# Patient Record
Sex: Female | Born: 1945 | Race: Black or African American | Hispanic: No | Smoking: Former smoker
Health system: Southern US, Community
[De-identification: ages and names within clinical notes are randomized; demographics above are authoritative.]

## PROBLEM LIST (undated history)

## (undated) DIAGNOSIS — I498 Other specified cardiac arrhythmias: Secondary | ICD-10-CM

## (undated) DIAGNOSIS — F32A Depression, unspecified: Secondary | ICD-10-CM

## (undated) DIAGNOSIS — I499 Cardiac arrhythmia, unspecified: Secondary | ICD-10-CM

## (undated) DIAGNOSIS — R51 Headache: Secondary | ICD-10-CM

## (undated) DIAGNOSIS — N183 Chronic kidney disease, stage 3 unspecified: Secondary | ICD-10-CM

## (undated) DIAGNOSIS — K449 Diaphragmatic hernia without obstruction or gangrene: Secondary | ICD-10-CM

## (undated) DIAGNOSIS — E119 Type 2 diabetes mellitus without complications: Secondary | ICD-10-CM

## (undated) DIAGNOSIS — I219 Acute myocardial infarction, unspecified: Secondary | ICD-10-CM

## (undated) DIAGNOSIS — M545 Low back pain, unspecified: Secondary | ICD-10-CM

## (undated) DIAGNOSIS — G971 Other reaction to spinal and lumbar puncture: Secondary | ICD-10-CM

## (undated) DIAGNOSIS — K219 Gastro-esophageal reflux disease without esophagitis: Secondary | ICD-10-CM

## (undated) DIAGNOSIS — M1990 Unspecified osteoarthritis, unspecified site: Secondary | ICD-10-CM

## (undated) DIAGNOSIS — I209 Angina pectoris, unspecified: Secondary | ICD-10-CM

## (undated) DIAGNOSIS — E669 Obesity, unspecified: Secondary | ICD-10-CM

## (undated) DIAGNOSIS — I509 Heart failure, unspecified: Secondary | ICD-10-CM

## (undated) DIAGNOSIS — I10 Essential (primary) hypertension: Secondary | ICD-10-CM

## (undated) DIAGNOSIS — Z9889 Other specified postprocedural states: Secondary | ICD-10-CM

## (undated) DIAGNOSIS — E039 Hypothyroidism, unspecified: Secondary | ICD-10-CM

## (undated) DIAGNOSIS — R112 Nausea with vomiting, unspecified: Secondary | ICD-10-CM

## (undated) DIAGNOSIS — D509 Iron deficiency anemia, unspecified: Secondary | ICD-10-CM

## (undated) DIAGNOSIS — I2510 Atherosclerotic heart disease of native coronary artery without angina pectoris: Secondary | ICD-10-CM

## (undated) DIAGNOSIS — R002 Palpitations: Secondary | ICD-10-CM

## (undated) DIAGNOSIS — I4891 Unspecified atrial fibrillation: Secondary | ICD-10-CM

## (undated) DIAGNOSIS — F329 Major depressive disorder, single episode, unspecified: Secondary | ICD-10-CM

## (undated) DIAGNOSIS — R519 Headache, unspecified: Secondary | ICD-10-CM

## (undated) DIAGNOSIS — J189 Pneumonia, unspecified organism: Secondary | ICD-10-CM

## (undated) DIAGNOSIS — J069 Acute upper respiratory infection, unspecified: Secondary | ICD-10-CM

## (undated) DIAGNOSIS — G8929 Other chronic pain: Secondary | ICD-10-CM

## (undated) DIAGNOSIS — R011 Cardiac murmur, unspecified: Secondary | ICD-10-CM

## (undated) DIAGNOSIS — IMO0001 Reserved for inherently not codable concepts without codable children: Secondary | ICD-10-CM

## (undated) DIAGNOSIS — Z5189 Encounter for other specified aftercare: Secondary | ICD-10-CM

## (undated) DIAGNOSIS — R0602 Shortness of breath: Secondary | ICD-10-CM

## (undated) DIAGNOSIS — T4145XA Adverse effect of unspecified anesthetic, initial encounter: Secondary | ICD-10-CM

## (undated) DIAGNOSIS — I472 Ventricular tachycardia: Secondary | ICD-10-CM

## (undated) HISTORY — DX: Essential (primary) hypertension: I10

## (undated) HISTORY — DX: Atherosclerotic heart disease of native coronary artery without angina pectoris: I25.10

## (undated) HISTORY — PX: DILATION AND CURETTAGE OF UTERUS: SHX78

## (undated) HISTORY — PX: TUBAL LIGATION: SHX77

## (undated) HISTORY — PX: LAPAROSCOPIC SALPINGOOPHERECTOMY: SUR795

## (undated) HISTORY — PX: ABDOMINAL HYSTERECTOMY: SHX81

## (undated) HISTORY — DX: Cardiac arrhythmia, unspecified: I49.9

## (undated) HISTORY — PX: REFRACTIVE SURGERY: SHX103

## (undated) HISTORY — DX: Obesity, unspecified: E66.9

## (undated) HISTORY — DX: Palpitations: R00.2

## (undated) HISTORY — DX: Other specified cardiac arrhythmias: I49.8

---

## 2008-04-10 HISTORY — PX: CORONARY ANGIOPLASTY WITH STENT PLACEMENT: SHX49

## 2009-05-11 DIAGNOSIS — I219 Acute myocardial infarction, unspecified: Secondary | ICD-10-CM

## 2009-05-11 DIAGNOSIS — T4145XA Adverse effect of unspecified anesthetic, initial encounter: Secondary | ICD-10-CM

## 2009-05-11 HISTORY — DX: Adverse effect of unspecified anesthetic, initial encounter: T41.45XA

## 2009-05-11 HISTORY — PX: CORONARY ANGIOPLASTY WITH STENT PLACEMENT: SHX49

## 2009-05-11 HISTORY — DX: Acute myocardial infarction, unspecified: I21.9

## 2010-02-05 ENCOUNTER — Emergency Department (HOSPITAL_COMMUNITY): Admission: EM | Admit: 2010-02-05 | Discharge: 2010-02-05 | Payer: Self-pay | Admitting: Emergency Medicine

## 2010-02-16 ENCOUNTER — Emergency Department (HOSPITAL_COMMUNITY): Admission: EM | Admit: 2010-02-16 | Discharge: 2010-02-16 | Payer: Self-pay | Admitting: Emergency Medicine

## 2010-02-19 ENCOUNTER — Emergency Department (HOSPITAL_COMMUNITY): Admission: EM | Admit: 2010-02-19 | Discharge: 2010-02-19 | Payer: Self-pay | Admitting: Emergency Medicine

## 2010-02-21 ENCOUNTER — Emergency Department (HOSPITAL_COMMUNITY): Admission: EM | Admit: 2010-02-21 | Discharge: 2010-02-21 | Payer: Self-pay | Admitting: Emergency Medicine

## 2010-03-03 ENCOUNTER — Ambulatory Visit: Payer: Self-pay | Admitting: Cardiothoracic Surgery

## 2010-03-09 ENCOUNTER — Emergency Department (HOSPITAL_COMMUNITY)
Admission: EM | Admit: 2010-03-09 | Discharge: 2010-03-09 | Payer: Self-pay | Source: Home / Self Care | Admitting: Emergency Medicine

## 2010-03-10 ENCOUNTER — Ambulatory Visit (HOSPITAL_COMMUNITY)
Admission: RE | Admit: 2010-03-10 | Discharge: 2010-03-10 | Payer: Self-pay | Source: Home / Self Care | Admitting: Cardiothoracic Surgery

## 2010-03-11 ENCOUNTER — Ambulatory Visit: Payer: Self-pay | Admitting: Cardiothoracic Surgery

## 2010-03-17 ENCOUNTER — Inpatient Hospital Stay (HOSPITAL_COMMUNITY): Admission: EM | Admit: 2010-03-17 | Discharge: 2010-03-03 | Payer: Self-pay | Admitting: Emergency Medicine

## 2010-04-10 HISTORY — PX: VIDEO ASSISTED THORACOSCOPY (VATS)/ LOBECTOMY: SHX6169

## 2010-06-21 LAB — URINALYSIS, ROUTINE W REFLEX MICROSCOPIC
Bilirubin Urine: NEGATIVE
Glucose, UA: NEGATIVE mg/dL
Hgb urine dipstick: NEGATIVE
Ketones, ur: NEGATIVE mg/dL
Ketones, ur: NEGATIVE mg/dL
Leukocytes, UA: NEGATIVE
Leukocytes, UA: NEGATIVE
Nitrite: NEGATIVE
Nitrite: NEGATIVE
Protein, ur: 100 mg/dL — AB
Protein, ur: >300 mg/dL — AB
Specific Gravity, Urine: 1.017 (ref 1.005–1.030)
Urobilinogen, UA: 0.2 mg/dL (ref 0.0–1.0)
Urobilinogen, UA: 0.2 mg/dL (ref 0.0–1.0)
pH: 5.5 (ref 5.0–8.0)

## 2010-06-21 LAB — BASIC METABOLIC PANEL
BUN: 23 mg/dL (ref 6–23)
BUN: 30 mg/dL — ABNORMAL HIGH (ref 6–23)
BUN: 30 mg/dL — ABNORMAL HIGH (ref 6–23)
BUN: 30 mg/dL — ABNORMAL HIGH (ref 6–23)
BUN: 31 mg/dL — ABNORMAL HIGH (ref 6–23)
CO2: 23 mEq/L (ref 19–32)
CO2: 24 mEq/L (ref 19–32)
CO2: 25 mEq/L (ref 19–32)
CO2: 26 mEq/L (ref 19–32)
CO2: 26 mEq/L (ref 19–32)
Calcium: 9.4 mg/dL (ref 8.4–10.5)
Calcium: 9.5 mg/dL (ref 8.4–10.5)
Calcium: 9.7 mg/dL (ref 8.4–10.5)
Calcium: 9.7 mg/dL (ref 8.4–10.5)
Chloride: 108 mEq/L (ref 96–112)
Chloride: 108 mEq/L (ref 96–112)
Chloride: 109 mEq/L (ref 96–112)
Chloride: 111 mEq/L (ref 96–112)
Chloride: 112 mEq/L (ref 96–112)
Creatinine, Ser: 1.65 mg/dL — ABNORMAL HIGH (ref 0.4–1.2)
Creatinine, Ser: 1.69 mg/dL — ABNORMAL HIGH (ref 0.4–1.2)
Creatinine, Ser: 1.79 mg/dL — ABNORMAL HIGH (ref 0.4–1.2)
Creatinine, Ser: 1.84 mg/dL — ABNORMAL HIGH (ref 0.4–1.2)
Creatinine, Ser: 1.91 mg/dL — ABNORMAL HIGH (ref 0.4–1.2)
Creatinine, Ser: 2.03 mg/dL — ABNORMAL HIGH (ref 0.4–1.2)
GFR calc Af Amer: 32 mL/min — ABNORMAL LOW (ref >60)
GFR calc Af Amer: 34 mL/min — ABNORMAL LOW (ref >60)
GFR calc Af Amer: 37 mL/min — ABNORMAL LOW (ref >60)
GFR calc Af Amer: 38 mL/min — ABNORMAL LOW (ref >60)
GFR calc non Af Amer: 26 mL/min — ABNORMAL LOW (ref >60)
GFR calc non Af Amer: 29 mL/min — ABNORMAL LOW (ref >60)
GFR calc non Af Amer: 30 mL/min — ABNORMAL LOW (ref >60)
GFR calc non Af Amer: 31 mL/min — ABNORMAL LOW (ref >60)
Glucose, Bld: 109 mg/dL — ABNORMAL HIGH (ref 70–99)
Glucose, Bld: 116 mg/dL — ABNORMAL HIGH (ref 70–99)
Glucose, Bld: 120 mg/dL — ABNORMAL HIGH (ref 70–99)
Glucose, Bld: 178 mg/dL — ABNORMAL HIGH (ref 70–99)
Potassium: 3.5 mEq/L (ref 3.5–5.1)
Potassium: 3.5 mEq/L (ref 3.5–5.1)
Potassium: 3.6 mEq/L (ref 3.5–5.1)
Potassium: 3.7 mEq/L (ref 3.5–5.1)
Potassium: 4.2 mEq/L (ref 3.5–5.1)
Sodium: 142 mEq/L (ref 135–145)
Sodium: 145 mEq/L (ref 135–145)
Sodium: 145 mEq/L (ref 135–145)

## 2010-06-21 LAB — COMPREHENSIVE METABOLIC PANEL
ALT: 11 U/L (ref 0–35)
AST: 17 U/L (ref 0–37)
Albumin: 3.5 g/dL (ref 3.5–5.2)
CO2: 26 mEq/L (ref 19–32)
Calcium: 9.3 mg/dL (ref 8.4–10.5)
Creatinine, Ser: 1.81 mg/dL — ABNORMAL HIGH (ref 0.4–1.2)
GFR calc Af Amer: 34 mL/min — ABNORMAL LOW (ref >60)
GFR calc non Af Amer: 28 mL/min — ABNORMAL LOW (ref >60)
Sodium: 145 mEq/L (ref 135–145)

## 2010-06-21 LAB — CBC
HCT: 28.4 % — ABNORMAL LOW (ref 36.0–46.0)
HCT: 29.7 % — ABNORMAL LOW (ref 36.0–46.0)
HCT: 34.2 % — ABNORMAL LOW (ref 36.0–46.0)
HCT: 34.2 % — ABNORMAL LOW (ref 36.0–46.0)
Hemoglobin: 11.3 g/dL — ABNORMAL LOW (ref 12.0–15.0)
Hemoglobin: 11.5 g/dL — ABNORMAL LOW (ref 12.0–15.0)
Hemoglobin: 11.8 g/dL — ABNORMAL LOW (ref 12.0–15.0)
Hemoglobin: 11.8 g/dL — ABNORMAL LOW (ref 12.0–15.0)
MCH: 30.2 pg (ref 26.0–34.0)
MCH: 30.4 pg (ref 26.0–34.0)
MCH: 30.4 pg (ref 26.0–34.0)
MCH: 30.4 pg (ref 26.0–34.0)
MCHC: 34.2 g/dL (ref 30.0–36.0)
MCHC: 34.4 g/dL (ref 30.0–36.0)
MCHC: 34.6 g/dL (ref 30.0–36.0)
MCHC: 34.6 g/dL (ref 30.0–36.0)
MCHC: 34.7 g/dL (ref 30.0–36.0)
MCV: 87.8 fL (ref 78.0–100.0)
MCV: 88.2 fL (ref 78.0–100.0)
MCV: 88.4 fL (ref 78.0–100.0)
MCV: 88.6 fL (ref 78.0–100.0)
Platelets: 184 10*3/uL (ref 150–400)
Platelets: 187 10*3/uL (ref 150–400)
Platelets: 204 10*3/uL (ref 150–400)
Platelets: 222 10*3/uL (ref 150–400)
Platelets: 276 10*3/uL (ref 150–400)
Platelets: 277 10*3/uL (ref 150–400)
RBC: 3.68 MIL/uL — ABNORMAL LOW (ref 3.87–5.11)
RBC: 3.73 MIL/uL — ABNORMAL LOW (ref 3.87–5.11)
RBC: 3.81 MIL/uL — ABNORMAL LOW (ref 3.87–5.11)
RBC: 3.87 MIL/uL (ref 3.87–5.11)
RBC: 3.89 MIL/uL (ref 3.87–5.11)
RDW: 14.9 % (ref 11.5–15.5)
RDW: 15.4 % (ref 11.5–15.5)
RDW: 15.4 % (ref 11.5–15.5)
RDW: 15.5 % (ref 11.5–15.5)
WBC: 5.9 10*3/uL (ref 4.0–10.5)
WBC: 6 10*3/uL (ref 4.0–10.5)
WBC: 7.7 10*3/uL (ref 4.0–10.5)
WBC: 8.2 10*3/uL (ref 4.0–10.5)

## 2010-06-21 LAB — DIFFERENTIAL
Basophils Absolute: 0 10*3/uL (ref 0.0–0.1)
Basophils Absolute: 0.1 10*3/uL (ref 0.0–0.1)
Basophils Relative: 0 % (ref 0–1)
Basophils Relative: 0 % (ref 0–1)
Basophils Relative: 1 % (ref 0–1)
Eosinophils Absolute: 0.2 10*3/uL (ref 0.0–0.7)
Eosinophils Absolute: 0.2 10*3/uL (ref 0.0–0.7)
Eosinophils Absolute: 0.2 10*3/uL (ref 0.0–0.7)
Eosinophils Relative: 2 % (ref 0–5)
Eosinophils Relative: 3 % (ref 0–5)
Lymphocytes Relative: 30 % (ref 12–46)
Lymphocytes Relative: 34 % (ref 12–46)
Lymphs Abs: 1.9 10*3/uL (ref 0.7–4.0)
Lymphs Abs: 2 10*3/uL (ref 0.7–4.0)
Lymphs Abs: 2.5 10*3/uL (ref 0.7–4.0)
Lymphs Abs: 2.6 10*3/uL (ref 0.7–4.0)
Monocytes Absolute: 0.1 10*3/uL (ref 0.1–1.0)
Monocytes Absolute: 0.3 10*3/uL (ref 0.1–1.0)
Monocytes Relative: 1 % — ABNORMAL LOW (ref 3–12)
Monocytes Relative: 4 % (ref 3–12)
Monocytes Relative: 5 % (ref 3–12)
Neutro Abs: 3.7 10*3/uL (ref 1.7–7.7)
Neutro Abs: 4.5 10*3/uL (ref 1.7–7.7)
Neutro Abs: 5.1 10*3/uL (ref 1.7–7.7)
Neutrophils Relative %: 59 % (ref 43–77)
Neutrophils Relative %: 62 % (ref 43–77)
Neutrophils Relative %: 63 % (ref 43–77)
Neutrophils Relative %: 64 % (ref 43–77)

## 2010-06-21 LAB — GLUCOSE, CAPILLARY
Glucose-Capillary: 145 mg/dL — ABNORMAL HIGH (ref 70–99)
Glucose-Capillary: 114 mg/dL — ABNORMAL HIGH (ref 70–99)
Glucose-Capillary: 117 mg/dL — ABNORMAL HIGH (ref 70–99)
Glucose-Capillary: 120 mg/dL — ABNORMAL HIGH (ref 70–99)
Glucose-Capillary: 128 mg/dL — ABNORMAL HIGH (ref 70–99)
Glucose-Capillary: 131 mg/dL — ABNORMAL HIGH (ref 70–99)
Glucose-Capillary: 135 mg/dL — ABNORMAL HIGH (ref 70–99)
Glucose-Capillary: 177 mg/dL — ABNORMAL HIGH (ref 70–99)
Glucose-Capillary: 182 mg/dL — ABNORMAL HIGH (ref 70–99)
Glucose-Capillary: 186 mg/dL — ABNORMAL HIGH (ref 70–99)
Glucose-Capillary: 204 mg/dL — ABNORMAL HIGH (ref 70–99)
Glucose-Capillary: 208 mg/dL — ABNORMAL HIGH (ref 70–99)
Glucose-Capillary: 218 mg/dL — ABNORMAL HIGH (ref 70–99)
Glucose-Capillary: 239 mg/dL — ABNORMAL HIGH (ref 70–99)

## 2010-06-21 LAB — POCT CARDIAC MARKERS
CKMB, poc: 1.1 ng/mL (ref 1.0–8.0)
CKMB, poc: 1.1 ng/mL (ref 1.0–8.0)
CKMB, poc: 1.8 ng/mL (ref 1.0–8.0)
CKMB, poc: 3.2 ng/mL (ref 1.0–8.0)
Myoglobin, poc: 156 ng/mL (ref 12–200)
Myoglobin, poc: 165 ng/mL (ref 12–200)
Myoglobin, poc: 194 ng/mL (ref 12–200)
Myoglobin, poc: 275 ng/mL (ref 12–200)
Myoglobin, poc: 316 ng/mL (ref 12–200)
Troponin i, poc: <0.05 ng/mL (ref 0.00–0.09)
Troponin i, poc: <0.05 ng/mL (ref 0.00–0.09)
Troponin i, poc: <0.05 ng/mL (ref 0.00–0.09)

## 2010-06-21 LAB — URINE MICROSCOPIC-ADD ON

## 2010-06-21 LAB — URINE CULTURE
Colony Count: NO GROWTH
Culture  Setup Time: 201111302119
Culture: NO GROWTH

## 2010-06-21 LAB — HEMOGLOBIN A1C
Hgb A1c MFr Bld: 6.6 % — ABNORMAL HIGH (ref <5.7)
Mean Plasma Glucose: 143 mg/dL — ABNORMAL HIGH (ref <117)

## 2010-06-21 LAB — LIPID PANEL
HDL: 38 mg/dL — ABNORMAL LOW (ref >39)
Total CHOL/HDL Ratio: 3.8 RATIO
Triglycerides: 201 mg/dL — ABNORMAL HIGH (ref <150)
VLDL: 40 mg/dL (ref 0–40)

## 2010-06-21 LAB — CARDIAC PANEL(CRET KIN+CKTOT+MB+TROPI)
CK, MB: 1.9 ng/mL (ref 0.3–4.0)
CK, MB: 2.3 ng/mL (ref 0.3–4.0)
Relative Index: 1.1 (ref 0.0–2.5)
Relative Index: 1.2 (ref 0.0–2.5)
Total CK: 165 U/L (ref 7–177)
Total CK: 167 U/L (ref 7–177)
Total CK: 193 U/L — ABNORMAL HIGH (ref 7–177)
Troponin I: 0.02 ng/mL (ref 0.00–0.06)

## 2010-06-21 LAB — TSH: TSH: 1.81 u[IU]/mL (ref 0.350–4.500)

## 2010-08-23 NOTE — Assessment & Plan Note (Signed)
OFFICE VISIT   ORTHA, METTS  DOB:  1945/04/24                                        March 11, 2010  CHART #:  [id number]   PRIMARY CARE PHYSICIAN:  Massie Maroon, MD   PRIMARY CARDIOLOGIST:  Thurmon Fair, MD [facility].   PROBLEMS:  1. 1.5-cm left upper lobe nodule with mild hypermetabolic activity on      PET scan, consistent with bronchogenic carcinoma.  2. History of coronary artery disease, status post left anterior      descending coronary artery stent in [place], [place] in January      2011 with EF of 30%.  3. Hypertension.  4. Diabetes mellitus.  5. Chronic renal insufficiency with creatinine of 1.8.  6. History of ventricular bigeminy.  7. Dyslipidemia.  8. Obesity.   CURRENT MEDICATIONS:  1. Coreg 3.125 mg b.i.d.  2. Humalog insulin with meals per sliding scale.  3. Lantus insulin 20 units daily.  4. Plavix 75 mg daily (bare metal stent placed in January 2011).  5. Aspirin 81 mg daily.  6. Imdur 60 mg daily.  7. Omeprazole 20 mg p.o. q.a.m.  8. Iron 1 tablet daily.  9. Amlodipine 10 mg q.a.m.  10.Potassium 20 mEq q.a.m.  11.Temazepam 50 mg at bedtime p.r.n.  12.Lasix 80 mg p.o. daily.  13.Simvastatin 40 mg at bedtime.  14.Hydralazine 50 mg q.a.m., 25 mg at noon and 25 mg at bedtime.  15.Avapro 25 mg q.a.m.  16.Zemplar 1 mcg p.o. daily.  17.Lumigan eyedrops at bedtime.  18.Alphagan eyedrops b.i.d.   HISTORY OF PRESENT ILLNESS:  Ms. Wanda [name] is a 65 year old African  American female, ex-smoker who was recently hospitalized at [facility]  for a variety of reasons including ventricular ectopy, a fall with a  right ankle injury, severe hypertension and chest pain, and treatment of  a right lower lobe pneumonia.  During this hospitalization, a CT scan  was performed to rule out PE, but demonstrated a spiculated 1.5-cm  nodule on the left upper lobe.  She was treated for her other problems  with  antibiotics and Lasix, and a Cardiology consultation was completed  by Dr. Royann Shivers, who recommended obtaining the patient's records from  her percutaneous intervention earlier this year at [facility], [place]  and to follow her up in the office.  Since her return home, she has had  no recurrent symptoms of chest pain or of upper respiratory infection.  We have received the records from Dr. Doylene Bode in [place], who  has been the patient's cardiologist for several years.  She had  percutaneous intervention for an acute MI - unstable angina in January  2011 with a bare-metal stent to the LAD.  She had recurrent chest pain  and the arteriogram in February 2011 showed a widely patent stent  without other significant disease.  Of note, her EF was noted to be 30%.  She has no known valvular disease, and she has been taking Plavix daily.  The diagonal branch of the LAD was partially obstructed by the LAD  stent.  This has been well tolerated.   SOCIAL HISTORY:  The patient lives with her daughter in [place], also is  staying with her sister now in [place].  She does not smoke or drink  alcohol.   FAMILY HISTORY:  Positive for cancer in her son, who died of a CNS  lymphoma and sister, who had breast cancer.   ALLERGIES:  ERYTHROMYCIN.   PHYSICAL EXAMINATION:  Vital Signs:  The patient is 5 feet and 1, weighs  210 pounds.  Blood pressure 170/60, pulse 50, respirations 20,  saturation 97% on room air.  General:  She is alert and anxious, but  pleasant.  Lungs:  Breath sounds are clear and equal.  Cardiac:  Rhythm is regular,  but slow without murmur.  Neck:  Without adenopathy.  Extremities:  Without edema or tenderness.  Neurologic:  Intact.  She is able to walk  slowly, although she favors her right leg due to the ankle sprain.   LABORATORY DATA:  Since the patient was in the hospital, she has  received a PET scan and CT scan of the brain.  CT scan shows a possible  internal  capsule small infarct, but no evidence of metastatic disease.  The PET scan shows a 1.5-cm left upper lobe nodule in the central region  of the upper lobe with a SUB of 2.4 showing mild hypermetabolic  activity.  There is no abnormal metabolic activity of the mediastinal  nodes, the contralateral lung, or intraabdominal organs.   Office spirometry performed today shows FEV-1 of 2.1 and FVC of 3.1,  both almost normal.   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  She has associated significant coronary artery  disease and left ventricular dysfunction.  She has a bare-metal stent in  the left anterior descending coronary artery, which was placed over 9  months ago.  She has not had cardiac assessment in [place], but her  records from earlier this year are obtained from her  cardiologist, Dr. Doylene Bode in [place], [place].   I have recommended the patient to proceed with left VATS and probable  left upper lobectomy.  I would not recommend needle biopsy or  electromagnetic navigational bronchoscopy for biopsy due to the small  size of the lesion.  Even if the biopsy is negative, I would still  recommend the resection due to his highly suspicious nature on CT scan.  She will consider her options having the surgery here versus going back  to [place] as she has a daughter to provide better support for her after  the hospitalization.  She will let us know within the next 72 hours and  we can tentatively schedule her surgery for the next 7-10 days.  Prior  to surgery, she would need a chest wall 2-D echo, and we will have Dr.  Royann Shivers and his colleagues follow the patient after her thoracotomy.   Kerin Perna, M.D.  Electronically Signed   PV/MEDQ  D:  03/11/2010  T:  03/12/2010  Job:  [id number]

## 2010-09-05 ENCOUNTER — Emergency Department (HOSPITAL_COMMUNITY): Payer: Medicaid - Out of State

## 2010-09-05 ENCOUNTER — Emergency Department (HOSPITAL_COMMUNITY)
Admission: EM | Admit: 2010-09-05 | Discharge: 2010-09-06 | Disposition: A | Discharge status: Home / Self Care | Payer: Medicaid - Out of State | Attending: Emergency Medicine | Admitting: Emergency Medicine

## 2010-09-05 DIAGNOSIS — M7989 Other specified soft tissue disorders: Secondary | ICD-10-CM | POA: Insufficient documentation

## 2010-09-05 DIAGNOSIS — R5381 Other malaise: Secondary | ICD-10-CM | POA: Insufficient documentation

## 2010-09-05 DIAGNOSIS — R5383 Other fatigue: Secondary | ICD-10-CM | POA: Insufficient documentation

## 2010-09-05 DIAGNOSIS — R609 Edema, unspecified: Secondary | ICD-10-CM | POA: Insufficient documentation

## 2010-09-05 DIAGNOSIS — N289 Disorder of kidney and ureter, unspecified: Secondary | ICD-10-CM | POA: Insufficient documentation

## 2010-09-05 DIAGNOSIS — I10 Essential (primary) hypertension: Secondary | ICD-10-CM | POA: Insufficient documentation

## 2010-09-05 DIAGNOSIS — I509 Heart failure, unspecified: Secondary | ICD-10-CM | POA: Insufficient documentation

## 2010-09-05 DIAGNOSIS — Z85118 Personal history of other malignant neoplasm of bronchus and lung: Secondary | ICD-10-CM | POA: Insufficient documentation

## 2010-09-05 DIAGNOSIS — I498 Other specified cardiac arrhythmias: Secondary | ICD-10-CM | POA: Insufficient documentation

## 2010-09-05 DIAGNOSIS — E119 Type 2 diabetes mellitus without complications: Secondary | ICD-10-CM | POA: Insufficient documentation

## 2010-09-05 LAB — CBC
Platelets: 235 10*3/uL (ref 150–400)
RBC: 3.88 MIL/uL (ref 3.87–5.11)
WBC: 7 10*3/uL (ref 4.0–10.5)

## 2010-09-05 LAB — DIFFERENTIAL
Basophils Relative: 0 % (ref 0–1)
Eosinophils Absolute: 0.1 10*3/uL (ref 0.0–0.7)
Neutrophils Relative %: 51 % (ref 43–77)

## 2010-09-05 LAB — GLUCOSE, CAPILLARY: Glucose-Capillary: 112 mg/dL — ABNORMAL HIGH (ref 70–99)

## 2010-09-06 LAB — URINE MICROSCOPIC-ADD ON

## 2010-09-06 LAB — BASIC METABOLIC PANEL
BUN: 44 mg/dL — ABNORMAL HIGH (ref 6–23)
Calcium: 9.4 mg/dL (ref 8.4–10.5)
GFR calc non Af Amer: 21 mL/min — ABNORMAL LOW (ref >60)
Glucose, Bld: 103 mg/dL — ABNORMAL HIGH (ref 70–99)
Potassium: 4.2 mEq/L (ref 3.5–5.1)

## 2010-09-06 LAB — CK TOTAL AND CKMB (NOT AT ARMC)
CK, MB: 2.9 ng/mL (ref 0.3–4.0)
Relative Index: 1.7 (ref 0.0–2.5)
Relative Index: 1.8 (ref 0.0–2.5)

## 2010-09-06 LAB — URINALYSIS, ROUTINE W REFLEX MICROSCOPIC
Bilirubin Urine: NEGATIVE
Hgb urine dipstick: NEGATIVE
Ketones, ur: NEGATIVE mg/dL
Nitrite: NEGATIVE
pH: 5.5 (ref 5.0–8.0)

## 2010-09-06 LAB — PROTIME-INR
INR: 1.06 (ref 0.00–1.49)
Prothrombin Time: 14 seconds (ref 11.6–15.2)

## 2010-09-06 LAB — TROPONIN I: Troponin I: <0.3 ng/mL (ref <0.30)

## 2010-09-07 ENCOUNTER — Telehealth: Payer: Self-pay | Admitting: Cardiology

## 2010-09-07 NOTE — Telephone Encounter (Signed)
ROI Mailed to PT  09/07/10/km

## 2010-09-08 ENCOUNTER — Emergency Department (HOSPITAL_COMMUNITY): Payer: Medicaid - Out of State

## 2010-09-08 ENCOUNTER — Emergency Department (HOSPITAL_COMMUNITY)
Admission: EM | Admit: 2010-09-08 | Discharge: 2010-09-08 | Disposition: A | Discharge status: Home / Self Care | Payer: Medicaid - Out of State | Attending: Emergency Medicine | Admitting: Emergency Medicine

## 2010-09-08 ENCOUNTER — Encounter: Payer: Self-pay | Admitting: Cardiology

## 2010-09-08 ENCOUNTER — Encounter: Payer: Self-pay | Admitting: *Deleted

## 2010-09-08 DIAGNOSIS — E119 Type 2 diabetes mellitus without complications: Secondary | ICD-10-CM | POA: Insufficient documentation

## 2010-09-08 DIAGNOSIS — Z794 Long term (current) use of insulin: Secondary | ICD-10-CM | POA: Insufficient documentation

## 2010-09-08 DIAGNOSIS — N289 Disorder of kidney and ureter, unspecified: Secondary | ICD-10-CM | POA: Insufficient documentation

## 2010-09-08 DIAGNOSIS — I2510 Atherosclerotic heart disease of native coronary artery without angina pectoris: Secondary | ICD-10-CM | POA: Insufficient documentation

## 2010-09-08 DIAGNOSIS — R0602 Shortness of breath: Secondary | ICD-10-CM | POA: Insufficient documentation

## 2010-09-08 DIAGNOSIS — I10 Essential (primary) hypertension: Secondary | ICD-10-CM | POA: Insufficient documentation

## 2010-09-08 LAB — POCT I-STAT, CHEM 8
BUN: 40 mg/dL — ABNORMAL HIGH (ref 6–23)
Chloride: 112 mEq/L (ref 96–112)
Creatinine, Ser: 2.1 mg/dL — ABNORMAL HIGH (ref 0.4–1.2)
Glucose, Bld: 72 mg/dL (ref 70–99)
Potassium: 3.6 mEq/L (ref 3.5–5.1)

## 2010-09-09 ENCOUNTER — Observation Stay (HOSPITAL_COMMUNITY)
Admission: EM | Admit: 2010-09-09 | Discharge: 2010-09-14 | Disposition: A | Discharge status: Home / Self Care | Payer: Medicare Other | Attending: Internal Medicine | Admitting: Internal Medicine

## 2010-09-09 ENCOUNTER — Emergency Department (HOSPITAL_COMMUNITY): Payer: Medicare Other

## 2010-09-09 ENCOUNTER — Ambulatory Visit: Payer: Medicaid - Out of State | Admitting: Cardiology

## 2010-09-09 DIAGNOSIS — I252 Old myocardial infarction: Secondary | ICD-10-CM | POA: Insufficient documentation

## 2010-09-09 DIAGNOSIS — J449 Chronic obstructive pulmonary disease, unspecified: Secondary | ICD-10-CM | POA: Insufficient documentation

## 2010-09-09 DIAGNOSIS — Z79899 Other long term (current) drug therapy: Secondary | ICD-10-CM | POA: Insufficient documentation

## 2010-09-09 DIAGNOSIS — J4489 Other specified chronic obstructive pulmonary disease: Secondary | ICD-10-CM | POA: Insufficient documentation

## 2010-09-09 DIAGNOSIS — J984 Other disorders of lung: Secondary | ICD-10-CM | POA: Insufficient documentation

## 2010-09-09 DIAGNOSIS — I4891 Unspecified atrial fibrillation: Secondary | ICD-10-CM | POA: Insufficient documentation

## 2010-09-09 DIAGNOSIS — Z794 Long term (current) use of insulin: Secondary | ICD-10-CM | POA: Insufficient documentation

## 2010-09-09 DIAGNOSIS — H409 Unspecified glaucoma: Secondary | ICD-10-CM | POA: Insufficient documentation

## 2010-09-09 DIAGNOSIS — I498 Other specified cardiac arrhythmias: Secondary | ICD-10-CM | POA: Insufficient documentation

## 2010-09-09 DIAGNOSIS — I129 Hypertensive chronic kidney disease with stage 1 through stage 4 chronic kidney disease, or unspecified chronic kidney disease: Secondary | ICD-10-CM | POA: Insufficient documentation

## 2010-09-09 DIAGNOSIS — G4733 Obstructive sleep apnea (adult) (pediatric): Secondary | ICD-10-CM | POA: Insufficient documentation

## 2010-09-09 DIAGNOSIS — I4949 Other premature depolarization: Secondary | ICD-10-CM | POA: Insufficient documentation

## 2010-09-09 DIAGNOSIS — E669 Obesity, unspecified: Secondary | ICD-10-CM | POA: Insufficient documentation

## 2010-09-09 DIAGNOSIS — N183 Chronic kidney disease, stage 3 unspecified: Secondary | ICD-10-CM | POA: Insufficient documentation

## 2010-09-09 DIAGNOSIS — I2510 Atherosclerotic heart disease of native coronary artery without angina pectoris: Secondary | ICD-10-CM | POA: Insufficient documentation

## 2010-09-09 DIAGNOSIS — I2789 Other specified pulmonary heart diseases: Secondary | ICD-10-CM | POA: Insufficient documentation

## 2010-09-09 DIAGNOSIS — E119 Type 2 diabetes mellitus without complications: Secondary | ICD-10-CM | POA: Insufficient documentation

## 2010-09-09 DIAGNOSIS — E785 Hyperlipidemia, unspecified: Secondary | ICD-10-CM | POA: Insufficient documentation

## 2010-09-09 DIAGNOSIS — I509 Heart failure, unspecified: Secondary | ICD-10-CM | POA: Insufficient documentation

## 2010-09-09 DIAGNOSIS — R0789 Other chest pain: Principal | ICD-10-CM | POA: Insufficient documentation

## 2010-09-09 DIAGNOSIS — D509 Iron deficiency anemia, unspecified: Secondary | ICD-10-CM | POA: Insufficient documentation

## 2010-09-09 DIAGNOSIS — I5030 Unspecified diastolic (congestive) heart failure: Secondary | ICD-10-CM | POA: Insufficient documentation

## 2010-09-10 LAB — CARDIAC PANEL(CRET KIN+CKTOT+MB+TROPI)
CK, MB: 2.5 ng/mL (ref 0.3–4.0)
CK, MB: 2.4 ng/mL (ref 0.3–4.0)
Relative Index: 1.6 (ref 0.0–2.5)
Total CK: 153 U/L (ref 7–177)
Troponin I: <0.3 ng/mL (ref <0.30)

## 2010-09-10 LAB — GLUCOSE, CAPILLARY
Glucose-Capillary: 170 mg/dL — ABNORMAL HIGH (ref 70–99)
Glucose-Capillary: 140 mg/dL — ABNORMAL HIGH (ref 70–99)

## 2010-09-10 LAB — COMPREHENSIVE METABOLIC PANEL
AST: 13 U/L (ref 0–37)
Alkaline Phosphatase: 132 U/L — ABNORMAL HIGH (ref 39–117)
BUN: 36 mg/dL — ABNORMAL HIGH (ref 6–23)
CO2: 23 mEq/L (ref 19–32)
Chloride: 106 mEq/L (ref 96–112)
Creatinine, Ser: 2.08 mg/dL — ABNORMAL HIGH (ref 0.4–1.2)
GFR calc Af Amer: 29 mL/min — ABNORMAL LOW (ref >60)
GFR calc non Af Amer: 24 mL/min — ABNORMAL LOW (ref >60)
Potassium: 3.4 mEq/L — ABNORMAL LOW (ref 3.5–5.1)
Total Bilirubin: 0.2 mg/dL — ABNORMAL LOW (ref 0.3–1.2)

## 2010-09-10 LAB — CBC
HCT: 31.2 % — ABNORMAL LOW (ref 36.0–46.0)
MCHC: 34 g/dL (ref 30.0–36.0)
MCV: 88.9 fL (ref 78.0–100.0)
RDW: 14.7 % (ref 11.5–15.5)

## 2010-09-10 LAB — CK TOTAL AND CKMB (NOT AT ARMC): Relative Index: 1.4 (ref 0.0–2.5)

## 2010-09-10 LAB — DIFFERENTIAL
Eosinophils Relative: 2 % (ref 0–5)
Lymphocytes Relative: 39 % (ref 12–46)
Lymphs Abs: 3.2 10*3/uL (ref 0.7–4.0)
Monocytes Absolute: 0.7 10*3/uL (ref 0.1–1.0)

## 2010-09-10 LAB — LIPID PANEL
Cholesterol: 155 mg/dL (ref 0–200)
LDL Cholesterol: 88 mg/dL (ref 0–99)
Triglycerides: 102 mg/dL (ref <150)
VLDL: 20 mg/dL (ref 0–40)

## 2010-09-10 LAB — TSH: TSH: 5.392 u[IU]/mL — ABNORMAL HIGH (ref 0.350–4.500)

## 2010-09-11 ENCOUNTER — Inpatient Hospital Stay (HOSPITAL_COMMUNITY): Payer: Medicare Other

## 2010-09-11 LAB — BASIC METABOLIC PANEL
BUN: 39 mg/dL — ABNORMAL HIGH (ref 6–23)
Calcium: 9.1 mg/dL (ref 8.4–10.5)
GFR calc non Af Amer: 25 mL/min — ABNORMAL LOW (ref >60)
Potassium: 3.5 mEq/L (ref 3.5–5.1)
Sodium: 145 mEq/L (ref 135–145)

## 2010-09-11 LAB — GLUCOSE, CAPILLARY
Glucose-Capillary: 90 mg/dL (ref 70–99)
Glucose-Capillary: 142 mg/dL — ABNORMAL HIGH (ref 70–99)
Glucose-Capillary: 84 mg/dL (ref 70–99)

## 2010-09-11 LAB — MAGNESIUM: Magnesium: 2.2 mg/dL (ref 1.5–2.5)

## 2010-09-11 LAB — CBC
MCHC: 32.7 g/dL (ref 30.0–36.0)
Platelets: 215 10*3/uL (ref 150–400)
RDW: 14.6 % (ref 11.5–15.5)
WBC: 6.9 10*3/uL (ref 4.0–10.5)

## 2010-09-12 ENCOUNTER — Inpatient Hospital Stay (HOSPITAL_COMMUNITY): Payer: Medicare Other

## 2010-09-12 LAB — CBC
MCHC: 33.1 g/dL (ref 30.0–36.0)
MCV: 90.7 fL (ref 78.0–100.0)
Platelets: 188 10*3/uL (ref 150–400)
RDW: 14.6 % (ref 11.5–15.5)
WBC: 5.8 10*3/uL (ref 4.0–10.5)

## 2010-09-12 LAB — BASIC METABOLIC PANEL
BUN: 39 mg/dL — ABNORMAL HIGH (ref 6–23)
Calcium: 9.4 mg/dL (ref 8.4–10.5)
Creatinine, Ser: 1.98 mg/dL — ABNORMAL HIGH (ref 0.4–1.2)
GFR calc non Af Amer: 25 mL/min — ABNORMAL LOW (ref >60)
Glucose, Bld: 74 mg/dL (ref 70–99)
Potassium: 4 mEq/L (ref 3.5–5.1)

## 2010-09-12 LAB — GLUCOSE, CAPILLARY
Glucose-Capillary: 69 mg/dL — ABNORMAL LOW (ref 70–99)
Glucose-Capillary: 103 mg/dL — ABNORMAL HIGH (ref 70–99)

## 2010-09-12 LAB — T4, FREE: Free T4: 1.34 ng/dL (ref 0.80–1.80)

## 2010-09-13 LAB — DIFFERENTIAL
Basophils Absolute: 0 10*3/uL (ref 0.0–0.1)
Basophils Relative: 1 % (ref 0–1)
Eosinophils Absolute: 0.2 10*3/uL (ref 0.0–0.7)
Neutro Abs: 2.8 10*3/uL (ref 1.7–7.7)
Neutrophils Relative %: 45 % (ref 43–77)

## 2010-09-13 LAB — BASIC METABOLIC PANEL
CO2: 24 mEq/L (ref 19–32)
Calcium: 9.6 mg/dL (ref 8.4–10.5)
Chloride: 108 mEq/L (ref 96–112)
Creatinine, Ser: 2 mg/dL — ABNORMAL HIGH (ref 0.4–1.2)
GFR calc Af Amer: 30 mL/min — ABNORMAL LOW (ref >60)
Sodium: 142 mEq/L (ref 135–145)

## 2010-09-13 LAB — CBC
Hemoglobin: 10.1 g/dL — ABNORMAL LOW (ref 12.0–15.0)
MCH: 29.8 pg (ref 26.0–34.0)
Platelets: 199 10*3/uL (ref 150–400)
RBC: 3.39 MIL/uL — ABNORMAL LOW (ref 3.87–5.11)
WBC: 6.2 10*3/uL (ref 4.0–10.5)

## 2010-09-13 LAB — GLUCOSE, CAPILLARY
Glucose-Capillary: 63 mg/dL — ABNORMAL LOW (ref 70–99)
Glucose-Capillary: 108 mg/dL — ABNORMAL HIGH (ref 70–99)
Glucose-Capillary: 168 mg/dL — ABNORMAL HIGH (ref 70–99)

## 2010-09-14 LAB — BASIC METABOLIC PANEL
BUN: 32 mg/dL — ABNORMAL HIGH (ref 6–23)
CO2: 26 mEq/L (ref 19–32)
Chloride: 109 mEq/L (ref 96–112)
Creatinine, Ser: 1.95 mg/dL — ABNORMAL HIGH (ref 0.4–1.2)
Potassium: 4.2 mEq/L (ref 3.5–5.1)

## 2010-09-14 LAB — GLUCOSE, CAPILLARY
Glucose-Capillary: 76 mg/dL (ref 70–99)
Glucose-Capillary: 162 mg/dL — ABNORMAL HIGH (ref 70–99)

## 2010-09-17 NOTE — H&P (Signed)
NAMEBRIGHID, Wanda [name]              ACCOUNT NO.:  [id number]  MEDICAL RECORD NO.:  [id number]           PATIENT TYPE:  I  LOCATION:  1423                         FACILITY:  [facility]  PHYSICIAN:  Della Goo, M.D. DATE OF BIRTH:  12-14-1945  DATE OF ADMISSION:  09/10/2010 DATE OF DISCHARGE:                             HISTORY & PHYSICAL   PRIMARY CARE PHYSICIAN:  Dr. Pearson Grippe.  CHIEF COMPLAINT:  Chest pain.  HISTORY OF PRESENT ILLNESS:  This is a 65 year old female who presents to the emergency department with complaints of substernal area chest pain radiating into the chest, which began 2 days ago.  She states the pain has been constant and rates the pain as being at 8/10 and associated with shortness of breath.  She states the pain was not relieved until she arrived in the emergency department and was given sublingual nitroglycerin.  The patient also reports having a chronic chest pain under the left breast area following a surgical exploration for lung cancer, which ended up being negative for lung cancer at that time.  The patient states that she has chronic pain since that particular surgery, which occurred in early 2012.  The patient denies having any family history of coronary artery disease.  She does have a remote history of tobacco abuse.  She quit in 1999.  She does have a history of coronary artery disease herself and has undergone a PTCA with stent placement x2 stents.  This patient denied having any nausea, vomiting, or diaphoresis associated with her symptoms.  PAST MEDICAL HISTORY: 1. History of coronary artery disease, status post PTCA with stents     x2. 2. Type 2 diabetes mellitus. 3. Dyslipidemia. 4. Hypertension. 5. Chronic renal insufficiency/chronic kidney disease stage 3. 6. Obesity. 7. Asthma. 8. Glaucoma.  PAST SURGICAL HISTORY:  Also includes an open reduction and internal fixation of the right ankle fracture.  MEDICATIONS:  Advair Diskus,  amiodarone, amlodipine, aspirin, bimatoprost ophthalmic drops, brimonidine ophthalmic drops, carvedilol, Plavix, ferrous sulfate, furosemide, Humalog insulin, hydralazine, hydrocodone/APAP, irbesartan, Lantus insulin, omeprazole, and paricalcitol and one other medication, which will need to be further verified.  She is not on an ACE inhibitor.  She is on an ACE receptor blocker.  ALLERGIES: 1. ERYTHROMYCIN BASE, reaction is angioedema. 2. QUESTIONABLE ALLERGY TO IVP DYE.  The patient reports having IV dye     studies after a supposed allergic reaction without problems, but it     is not clear whether this may have been due to premedication with a     known history.  SOCIAL HISTORY:  As mentioned above.  The patient is a former smoker, quit smoking in 1999.  She is a nondrinker.  She denies any history of illicit drug usage.  FAMILY HISTORY:  Negative for coronary artery disease, but positive for hypertension in 4 sisters and diabetic disease in sisters as well and she states one sister had breast cancer and her mother died and had Parkinson disease.  REVIEW OF SYSTEMS:  Pertinent as mentioned above, but also mentioned that the patient has had a chronic cough for the past 2 to 3 months.  She states cough has been dry.  Denies any hemoptysis.  Denies any weight loss.  Denies having any fever or chills.  PHYSICAL EXAMINATION FINDINGS:  GENERAL:  This is a 65 year old well- nourished and well-developed obese African American female who is in no acute distress currently. VITAL SIGNS:  Temperature 98.0, blood pressure initially 184/61, heart rate 62, respirations 20, and O2 saturation 98% to 100%. HEENT:  Normocephalic and atraumatic.  Pupils equally round, reactive to light.  Extraocular movements are intact.  Funduscopic benign.  There is no scleral icterus.  Nares are patent bilaterally.  Oropharynx is clear. NECK:  Supple, full range of motion.  No thyromegaly, adenopathy,  or jugular venous distention. CARDIOVASCULAR:  Regular rate and rhythm.  No murmurs, gallops, or rubs appreciated. LUNGS:  Clear to auscultation bilaterally.  No rales, rhonchi, or wheezes. ABDOMEN:  Positive bowel sounds, soft, nontender, and nondistended.  No hepatosplenomegaly.  No rebound or guarding. EXTREMITIES:  Without cyanosis, clubbing, or edema. NEUROLOGIC:  The patient is alert and oriented x3.  There are no focal deficits.  LABORATORY STUDIES:  White blood cell count 8.2, hemoglobin 10.6, hematocrit 31.2, MCV 88.9, platelets 254, neutrophils 50%, lymphocytes 39%, prothrombin time 13.6, and INR 1.02.  Sodium level 141, potassium 3.4, chloride 106, carbon dioxide 23, BUN 36, creatinine 2.08, glucose 118, albumin 3.9, and calcium 9.2.  Cardiac enzymes with a total CK of 205, CK-MB 2.9, relative index 1.4, and troponin less than 0.30.  Chest x-ray reveals persistent elevation of the left hemidiaphragm with mild left basilar atelectasis.  Postoperative changes seen at the left hilum. Otherwise, the lungs are clear.  EKG reveals a normal sinus rhythm, but no acute ST-segment changes are seen.  ASSESSMENT:  This is a 65 year old female being admitted with: 1. Chest pain. 2. Hypertension, which is uncontrolled. 3. Chronic chest pain, status post surgery. 4. Type 2 diabetes mellitus. 5. Dyslipidemia. 6. Glaucoma.  PLAN:  The patient will be admitted for 23-hour observation and cardiac enzymes will be performed.  The patient will be placed on nitroglycerin paste, oxygen, and aspirin therapies.  Depending on these results, [facility] will be consulted.  The patient already had plans to see [facility] as an outpatient, but had not made it to the appointment yet.  The patient's regular medications will be further reconciled and sliding scale insulin coverage will be ordered and nebulizer treatments will also be ordered while the patient is hospitalized.  The  patient is a full code.     Della Goo, M.D.     HJ/MEDQ  D:  09/10/2010  T:  09/10/2010  Job:  [id number]  cc:   Massie Maroon, MD Fax: [phone]  Electronically Signed by Della Goo M.D. on 09/17/2010 06:32:45 AM

## 2010-09-19 ENCOUNTER — Inpatient Hospital Stay (HOSPITAL_COMMUNITY)
Admission: EM | Admit: 2010-09-19 | Discharge: 2010-09-21 | Disposition: A | Discharge status: Still In Hospital | Payer: PRIVATE HEALTH INSURANCE | Source: Home / Self Care | Attending: Cardiovascular Disease | Admitting: Cardiovascular Disease

## 2010-09-19 ENCOUNTER — Emergency Department (HOSPITAL_COMMUNITY): Payer: PRIVATE HEALTH INSURANCE

## 2010-09-19 DIAGNOSIS — I2510 Atherosclerotic heart disease of native coronary artery without angina pectoris: Secondary | ICD-10-CM | POA: Diagnosis present

## 2010-09-19 DIAGNOSIS — Z9861 Coronary angioplasty status: Secondary | ICD-10-CM

## 2010-09-19 DIAGNOSIS — G4733 Obstructive sleep apnea (adult) (pediatric): Secondary | ICD-10-CM | POA: Diagnosis present

## 2010-09-19 DIAGNOSIS — N183 Chronic kidney disease, stage 3 unspecified: Secondary | ICD-10-CM | POA: Diagnosis present

## 2010-09-19 DIAGNOSIS — K219 Gastro-esophageal reflux disease without esophagitis: Principal | ICD-10-CM | POA: Diagnosis present

## 2010-09-19 DIAGNOSIS — Z91041 Radiographic dye allergy status: Secondary | ICD-10-CM

## 2010-09-19 DIAGNOSIS — I428 Other cardiomyopathies: Secondary | ICD-10-CM | POA: Diagnosis present

## 2010-09-19 DIAGNOSIS — I252 Old myocardial infarction: Secondary | ICD-10-CM

## 2010-09-19 DIAGNOSIS — Z794 Long term (current) use of insulin: Secondary | ICD-10-CM

## 2010-09-19 DIAGNOSIS — Z7902 Long term (current) use of antithrombotics/antiplatelets: Secondary | ICD-10-CM

## 2010-09-19 DIAGNOSIS — Z87891 Personal history of nicotine dependence: Secondary | ICD-10-CM

## 2010-09-19 DIAGNOSIS — Z79899 Other long term (current) drug therapy: Secondary | ICD-10-CM

## 2010-09-19 DIAGNOSIS — J449 Chronic obstructive pulmonary disease, unspecified: Secondary | ICD-10-CM | POA: Diagnosis present

## 2010-09-19 DIAGNOSIS — E669 Obesity, unspecified: Secondary | ICD-10-CM | POA: Diagnosis present

## 2010-09-19 DIAGNOSIS — I129 Hypertensive chronic kidney disease with stage 1 through stage 4 chronic kidney disease, or unspecified chronic kidney disease: Secondary | ICD-10-CM | POA: Diagnosis present

## 2010-09-19 DIAGNOSIS — I2789 Other specified pulmonary heart diseases: Secondary | ICD-10-CM | POA: Diagnosis present

## 2010-09-19 DIAGNOSIS — J4489 Other specified chronic obstructive pulmonary disease: Secondary | ICD-10-CM | POA: Diagnosis present

## 2010-09-19 DIAGNOSIS — E119 Type 2 diabetes mellitus without complications: Secondary | ICD-10-CM | POA: Diagnosis present

## 2010-09-19 DIAGNOSIS — Z7982 Long term (current) use of aspirin: Secondary | ICD-10-CM

## 2010-09-19 DIAGNOSIS — I4891 Unspecified atrial fibrillation: Secondary | ICD-10-CM | POA: Diagnosis present

## 2010-09-19 DIAGNOSIS — H409 Unspecified glaucoma: Secondary | ICD-10-CM | POA: Diagnosis present

## 2010-09-19 LAB — COMPREHENSIVE METABOLIC PANEL
ALT: 11 U/L (ref 0–35)
BUN: 36 mg/dL — ABNORMAL HIGH (ref 6–23)
CO2: 23 mEq/L (ref 19–32)
Calcium: 9.6 mg/dL (ref 8.4–10.5)
Creatinine, Ser: 1.79 mg/dL — ABNORMAL HIGH (ref 0.4–1.2)
GFR calc Af Amer: 34 mL/min — ABNORMAL LOW (ref >60)
GFR calc non Af Amer: 29 mL/min — ABNORMAL LOW (ref >60)
Glucose, Bld: 111 mg/dL — ABNORMAL HIGH (ref 70–99)
Total Protein: 7.1 g/dL (ref 6.0–8.3)

## 2010-09-19 LAB — HEPARIN LEVEL (UNFRACTIONATED): Heparin Unfractionated: <0.1 IU/mL — ABNORMAL LOW (ref 0.30–0.70)

## 2010-09-19 LAB — TROPONIN I
Troponin I: <0.3 ng/mL (ref <0.30)
Troponin I: <0.3 ng/mL (ref <0.30)

## 2010-09-19 LAB — POCT I-STAT, CHEM 8
Chloride: 108 mEq/L (ref 96–112)
Glucose, Bld: 209 mg/dL — ABNORMAL HIGH (ref 70–99)
HCT: 33 % — ABNORMAL LOW (ref 36.0–46.0)
Hemoglobin: 11.2 g/dL — ABNORMAL LOW (ref 12.0–15.0)
Potassium: 4.3 mEq/L (ref 3.5–5.1)
Sodium: 143 mEq/L (ref 135–145)

## 2010-09-19 LAB — CK TOTAL AND CKMB (NOT AT ARMC)
CK, MB: 2 ng/mL (ref 0.3–4.0)
CK, MB: 1.8 ng/mL (ref 0.3–4.0)
Relative Index: 1.4 (ref 0.0–2.5)
Total CK: 146 U/L (ref 7–177)
Total CK: 127 U/L (ref 7–177)

## 2010-09-19 LAB — CBC
HCT: 32.2 % — ABNORMAL LOW (ref 36.0–46.0)
Hemoglobin: 10.6 g/dL — ABNORMAL LOW (ref 12.0–15.0)
MCHC: 32.9 g/dL (ref 30.0–36.0)
RBC: 3.59 MIL/uL — ABNORMAL LOW (ref 3.87–5.11)

## 2010-09-19 LAB — MAGNESIUM: Magnesium: 2.5 mg/dL (ref 1.5–2.5)

## 2010-09-19 LAB — MRSA PCR SCREENING: MRSA by PCR: NEGATIVE

## 2010-09-19 LAB — PROTIME-INR: Prothrombin Time: 13.8 seconds (ref 11.6–15.2)

## 2010-09-20 LAB — URINE MICROSCOPIC-ADD ON

## 2010-09-20 LAB — BASIC METABOLIC PANEL
BUN: 33 mg/dL — ABNORMAL HIGH (ref 6–23)
CO2: 25 mEq/L (ref 19–32)
GFR calc non Af Amer: 27 mL/min — ABNORMAL LOW (ref >60)
Glucose, Bld: 169 mg/dL — ABNORMAL HIGH (ref 70–99)
Potassium: 3.9 mEq/L (ref 3.5–5.1)

## 2010-09-20 LAB — CBC
HCT: 28.4 % — ABNORMAL LOW (ref 36.0–46.0)
Hemoglobin: 9.6 g/dL — ABNORMAL LOW (ref 12.0–15.0)
MCHC: 33.8 g/dL (ref 30.0–36.0)
MCV: 89.9 fL (ref 78.0–100.0)
RDW: 14.2 % (ref 11.5–15.5)
WBC: 6.8 10*3/uL (ref 4.0–10.5)

## 2010-09-20 LAB — URINALYSIS, ROUTINE W REFLEX MICROSCOPIC
Hgb urine dipstick: NEGATIVE
Leukocytes, UA: NEGATIVE
Nitrite: NEGATIVE
Protein, ur: 100 mg/dL — AB
Specific Gravity, Urine: 1.014 (ref 1.005–1.030)
Urobilinogen, UA: 0.2 mg/dL (ref 0.0–1.0)

## 2010-09-20 LAB — GLUCOSE, CAPILLARY
Glucose-Capillary: 211 mg/dL — ABNORMAL HIGH (ref 70–99)
Glucose-Capillary: 105 mg/dL — ABNORMAL HIGH (ref 70–99)
Glucose-Capillary: 155 mg/dL — ABNORMAL HIGH (ref 70–99)

## 2010-09-20 LAB — HEPARIN LEVEL (UNFRACTIONATED): Heparin Unfractionated: 0.35 IU/mL (ref 0.30–0.70)

## 2010-09-20 LAB — TROPONIN I: Troponin I: <0.3 ng/mL (ref <0.30)

## 2010-09-20 LAB — CK TOTAL AND CKMB (NOT AT ARMC): Relative Index: INVALID (ref 0.0–2.5)

## 2010-09-21 ENCOUNTER — Inpatient Hospital Stay (HOSPITAL_COMMUNITY)
Admission: RE | Admit: 2010-09-21 | Discharge: 2010-09-22 | DRG: 392 | Disposition: A | Discharge status: Home / Self Care | Payer: PRIVATE HEALTH INSURANCE | Source: Ambulatory Visit | Attending: Cardiovascular Disease | Admitting: Cardiovascular Disease

## 2010-09-21 LAB — CBC
MCHC: 33.8 g/dL (ref 30.0–36.0)
Platelets: 174 10*3/uL (ref 150–400)
RDW: 14.4 % (ref 11.5–15.5)
WBC: 6 10*3/uL (ref 4.0–10.5)

## 2010-09-21 LAB — POCT ACTIVATED CLOTTING TIME: Activated Clotting Time: 111 seconds

## 2010-09-21 LAB — BASIC METABOLIC PANEL
Calcium: 9.3 mg/dL (ref 8.4–10.5)
GFR calc Af Amer: 37 mL/min — ABNORMAL LOW (ref >60)
GFR calc non Af Amer: 31 mL/min — ABNORMAL LOW (ref >60)
Potassium: 4.3 mEq/L (ref 3.5–5.1)
Sodium: 143 mEq/L (ref 135–145)

## 2010-09-21 LAB — GLUCOSE, CAPILLARY: Glucose-Capillary: 291 mg/dL — ABNORMAL HIGH (ref 70–99)

## 2010-09-22 LAB — CBC
Hemoglobin: 9.9 g/dL — ABNORMAL LOW (ref 12.0–15.0)
MCH: 29.8 pg (ref 26.0–34.0)
MCV: 89.2 fL (ref 78.0–100.0)
Platelets: 174 10*3/uL (ref 150–400)
RBC: 3.32 MIL/uL — ABNORMAL LOW (ref 3.87–5.11)
WBC: 5.9 10*3/uL (ref 4.0–10.5)

## 2010-09-22 LAB — BASIC METABOLIC PANEL
CO2: 24 mEq/L (ref 19–32)
Calcium: 9.6 mg/dL (ref 8.4–10.5)
Chloride: 109 mEq/L (ref 96–112)
Glucose, Bld: 161 mg/dL — ABNORMAL HIGH (ref 70–99)
Sodium: 142 mEq/L (ref 135–145)

## 2010-09-22 LAB — GLUCOSE, CAPILLARY
Glucose-Capillary: 127 mg/dL — ABNORMAL HIGH (ref 70–99)
Glucose-Capillary: 159 mg/dL — ABNORMAL HIGH (ref 70–99)

## 2010-09-24 NOTE — Consult Note (Signed)
Wanda [name], [name]              ACCOUNT NO.:  [id number]  MEDICAL RECORD NO.:  [id number]           PATIENT TYPE:  I  LOCATION:  1423                         FACILITY:  [facility]  PHYSICIAN:  Thurmon Fair, MD     DATE OF BIRTH:  October 03, 1945  DATE OF CONSULTATION:  09/11/2010 DATE OF DISCHARGE:                                CONSULTATION   CHIEF COMPLAINT:  Chest pain.  HISTORY OF PRESENT ILLNESS:  Wanda [name] is a 65 year old female who is from [place].  We saw her in November 2011.  The patient has a history of coronary disease.  She had multiple catheterizations in the past. Apparently, she was cathed in the 90s, in 2000, and again in 2005, all showing no significant coronary disease.  In August 2009, she underwent a MINI VISION bare-metal stent to her diagonal.  Her EF at that time was 60%.  Subsequent records indicate that she had an acute anterior MI in January 2011 treated with an LAD bare-metal stent.  This occluded the diagonal branch.  Relook catheterization in February 2011 for chest pain showed patent stents.  Her ejection fraction at that time was 30%.  We saw her in November 2011 for chest pain when she was hospitalized here. She had just moved to this area from [place].  A workup during that admission showed a left lower lobe nodule suspicious for lung cancer. At the time of her exam in November 2011, Dr. Royann Shivers felt her symptoms were secondary to hypertension and PVCs.  Plan was for continued medical therapy and to proceed with workup of her of lung nodule.  The patient says that before Dr. Donata Clay, who had seen her in consult, could not operate, she went back to [place].  She apparently had her surgery in [place] in January 2012.  She says she had some type of stress test, possibly a stress echo prior to this surgery and was told it was okay. She recovered from her VATS.  Apparently did not show cancer.  She now says that she is developed some atrial  fibrillation in the past month and was put on amiodarone by her cardiologist, Dr. Benard Rink from [place].  This past Thursday, she moved back to this area.  Friday she was admitted to the hospital with chest pain.  She describes an upper chest pain with radiation to her left arm to her palm.  She says the nitroglycerin has helped.  We are asked to see her now in consult. She has had no further chest pain since she has been put on nitrates. Her enzymes have all been negative.  She has had PVCs, but no sustained tachycardia.  HOME MEDICATIONS: 1. Zemplar 1 mcg daily. 2. Temazepam 15 mg at bedtime. 3. Simvastatin 40 mg a day. 4. Potassium 20 mEq b.i.d. 5. Plavix 75 mg a day. 6. Omeprazole 20 mg a day. 7. Hydrocodone 5/325 q.6 p.r.n. 8. Lumigan ophthalmic drops. 9. Lantus 65 units each morning. 10.Imdur 60 mg a day. 11.Hydralazine 25 mg two in the morning, one at lunch, and one at     dinner. 12.Advair Diskus b.i.d. 13.Lasix 80  mg daily. 14.Iron 325 daily. 15.Coreg 3.125 mg b.i.d. 16.Alphagan gain ophthalmic drops. 17.Avapro 75 mg a day. 18.Aspirin 81 mg a day. 19.Norvasc 10 mg a day. 20.Amiodarone 200 mg a day.  ALLERGIES:  She reportedly is intolerant or allergic to ERYTHROMYCIN. In one chart, there is a mention of some IV contrast issues, but the patient is not sure of the details there.  There is also a mention of her coming off her Coreg in the past because of bradycardia.  PAST MEDICAL HISTORY:  Remarkable for type 2 insulin-dependent diabetes. She has stage III chronic renal insufficiency with a creatinine in the 2.0 range.  Though she smoked less than 10 years, she carries a diagnosis of COPD.  She has persistently elevated left hemidiaphragm probably from her surgery.  She has treated dyslipidemia.  She has glaucoma.  SOCIAL HISTORY:  She has four children, she quit smoking 15 years ago, she says she smoked a pack a day for 8 years.  FAMILY HISTORY:  Remarkable  for diabetes, but negative for coronary disease.  She did have a son who recently passed from lymphoma.  Her father died in his 45s of unknown causes.  REVIEW OF SYSTEMS:  Essentially unremarkable except for above, see history and physical for complete details.  PHYSICAL EXAMINATION:  VITAL SIGNS:  Blood pressure 138/72, pulse 62, temperature 98. GENERAL:  She is a obese African American female in no acute distress. HEENT:  Normocephalic.  She has poor dentition. NECK:  Without JVD or bruit. CHEST:  Clear to auscultation and percussion with decreased breath sounds at the left base. CARDIAC:  Regular rate and rhythm with extrasystoles noted. ABDOMEN:  Obese, nontender. EXTREMITIES:  Without edema.  Distal pulses are 2+/4 bilaterally. NEUROLOGIC:  Exam grossly intact.  She is awake, alert, oriented, and cooperative.  Moves all extremities without obvious deficit. SKIN:  Cool and dry.  LABORATORY DATA:  Sodium 141, potassium 3.4, BUN 36, creatinine 2.08. White count 8.2, hemoglobin 10.6, hematocrit 31.2, platelets 254.  EKG shows sinus rhythm, rate of 53 without acute changes.  QTC is 471.  IMPRESSION: 1. Chest pain on admission, myocardial infarction was ruled out.  This     sounds like it could have been angina, she is currently pain free     on nitroglycerin ointment one-half inch q.6. 2. Known coronary disease, history of diagonal bare-metal stent in     August 2009 in [place], [place] with LAD bare-metal stent in January     2011 with occlusion of the diagonal branch at that time secondary     to the stent.  Relook in February 2011 showed patentcy of these     site. 3. Previous mention of an ejection fraction of 30% in February 2011,     ejection fraction by echocardiogram September 11, 2010 is now 55% to 60%.     Frequent PVCs were noted. 4. Moderate pulmonary hypertension and echo with PA pressure of 45 mm. 5. Type 2 insulin-dependent diabetes with nephropathy. 6. Stage III chronic  renal insufficiency. 7. Left lower lobe nodule status post VATS in [place] in January 2012. 8. Frequent PVCs and bradycardia. 9. History of paroxysmal atrial fibrillation, recently put on     amiodarone. 10.Obesity with possible sleep apnea. 11.Glaucoma. 12.Treated hypertension.  PLAN:  We will change Wanda [name] nitrates to p.o.  She was on 60 mg a day at home.  We will increase this to 120 mg a day in divided dose. She may need further  assessment as an outpatient or possibly a diagnostic catheterization if she has recurrent chest pain.  She says she is now here in this area to stay.     Abelino Derrick, P.A.   ______________________________ Thurmon Fair, MD    LKK/MEDQ  D:  09/11/2010  T:  09/11/2010  Job:  [id number]  cc:   Thurmon Fair, MD Kerin Perna, M.D.  Electronically Signed by Corine Shelter P.A. on 09/12/2010 02:15:50 PM Electronically Signed by Thurmon Fair M.D. on 09/24/2010 07:16:14 AM

## 2010-09-24 NOTE — H&P (Signed)
Wanda [name], ZAMORANO              ACCOUNT NO.:  [id number]  MEDICAL RECORD NO.:  [id number]  LOCATION:  WLED                         FACILITY:  [facility]  PHYSICIAN:  Thurmon Fair, MD     DATE OF BIRTH:  [birth year]  DATE OF ADMISSION:  09/19/2010 DATE OF DISCHARGE:                             HISTORY & PHYSICAL   CHIEF COMPLAINT:  Chest pain.  HISTORY OF PRESENT ILLNESS:  A 65 year old African American female with recent discharge from [facility] on September 14, 2010, now represents with chest pain across her chest into her left upper arm. She did not have any nitroglycerin at home to take.  It was associated with nausea and shortness of breath and some visual blurring, it increased if she were to do activities.  She came to the emergency room and here without medication except for oxygen and a nebulizer treatment, pain is resolved.  The patient's coronary artery disease consists of several caths in the past.  She was living in [place] and originally her heart caths were at the [facility] in August 2009.  She was found to have significant one-vessel coronary artery disease with a 99% diagonal stenosis and preserved LV function.  She at that time also had chronic renal disease.  She underwent PTCA and stent deployment with a Mini- Vision 2.25-mm bare metal stent.  Tolerated procedure and did well. Previously, she had had cath in 1990s, 2000, and 2005.  With those heart caths, there was no significant disease.  She previously had an episode of heart failure secondary to diastolic dysfunction and active.  Since that time, she had transferred her care to [facility] and I do not have those records, but previously noted history of a cardiac consult in June of this year revealed acute anterior MI in January 2011, treated with a bare-metal stent to the LAD which occluded the diagonal branch.  They did relook the cath in February 2011 for chest pain and  it revealed patent stent.  Her EF at that time was 30%.  The patient was seen by our group [facility] in November 2011 for chest pain, and at that time, the symptoms were secondary to hypertension and PVCs.  Medical therapy was recommended.  Today, her pain is now reoccurring.  We are putting on IV nitroglycerin drip, but she is asking for nitroglycerin patch.  She does have a chronic renal insufficiency.  Her creatinine today is 2.  We will hold her ARB and give her fluids, and perhaps depending on cardiologist depending when the MD sees her, plan for [facility] tomorrow which would be on September 20, 2010.  Also, cardiac enzymes are positive.  On June, 1, 2012, when she was admitted, her enzymes have been negative.  Other recent history does include left lower lung nodule that was suspicious for lung cancer.  She was going to have surgery in [place], but ended up back in [place] where she had her VATS surgery. I reviewed portion of her lung and it was not cancerous per the patient. She does have pain at the incision site, but her chest discomfort she had today was different from  that type of pain.  On her last visit, September 09, 2010, she had been put on Imdur 120 mg a day.  Recent 2-D echo on September 11, 2010, LV function was normal.  She had mild concentric hypertrophy.  EF was 55% to 60%, although no diagnostic regional wall motion abnormality was identified.  This possibility cannot be completely excluded.  She had incessant PVCs during the first echo and peak pressure of her pulmonary arteries was 45 mmHg.  Her left atrium was mildly dilated.  She was to follow up with Dr. Royann Shivers on September 26, 2010, but she now returns because of her chest pain.  Other history includes; 1. Diabetes mellitus, insulin dependent. 2. Stage III chronic kidney disease. 3. Bradycardia in the past with beta-blocker.  She has been tolerating     Coreg 3.25 twice a day; 4. History of  PAF. 5. Hypertension. 6. Asthma. 7. Obstructive sleep apnea. 8. Dyslipidemia. 9. Obesity. 10.Glaucoma.  ALLERGIES:  ERYTHROMYCIN and possibly IV CONTRAST.  Please note, the patient has persistently elevated left hemidiaphragm from her surgery.  SOCIAL HISTORY:  Four children.  She quit smoking 15 years ago.  She smoked a pack a day for 8 years.  FAMILY HISTORY:  Positive for diabetes, but negative for coronary artery disease.  Her son died from lymphoma.  Her father died in his 3s of unknown causes.  REVIEW OF SYSTEMS:  Essentially negative except under HPI, nausea, shortness of breath, and chest pain.  Denies any blood in her stool or urine.  Denies any syncope and denies any other complications.  OUTPATIENT MEDICATIONS: 1. Amlodipine 10 mg daily. 2. Temazepam 15 mg daily at bedtime as needed. 3. Potassium chloride 20 mEq in the morning. 4. Plavix 75 mg daily. 5. Omeprazole 20 mg daily. 6. Norco hydrocodone/APAP 5/325 every 6 hours as needed. 7. Eyedrops Lumigan 0.03%, both eyes, 1 drop at bedtime. 8. Lantus injection 25 units daily. 9. Humalog insulin 45 units sliding scale. 10.Furosemide 40 mg one and a half tablet daily. 11.Carvedilol 3.125 mg one twice a day. 12.Alphagan 0.2% eyedrops 1 drop twice daily to both eyes. 13.Avapro 75 mg 1 daily. 14.Advair Diskus 250/50 one puff twice a day. 15.Hydralazine 25 mg 2 tabs in the morning, 1 tablet at lunch, 1     tablet at dinner. 16.Simvastatin 40 mg one daily at bedtime. 17.Imdur.  According to list, she was on 60 mg daily.  We have     attempted to increase to 120 daily. 18.Ferrous sulfate 1 tab twice a day. 19.Amiodarone 200 mg daily. 20.Zemplar 1 mcg one daily. 21.Aspirin 81 mg daily.  PHYSICAL EXAMINATION:  VITAL SIGNS:  Blood pressure 153/72, respiratory rate 18, pulse 67, temp 98.1, and SPO2 100%. GENERAL:  Alert, oriented, African American female in no acute distress, pleasant affect. HEENT:  Normocephalic.   Sclerae are clear.  Wig in place. NECK:  Supple.  No JVD.  No bruits.  No thyromegaly. LUNGS:  Fairly clear without rales, rhonchi, or wheezes. HEART:  Regular rate and rhythm.  No murmur, gallop, or click. ABDOMEN:  Soft and nontender.  Positive bowel sounds.  Do not palpate liver, spleen or masses. LOWER EXTREMITIES:  Pedals 2+ bilaterally.  No lower extremity edema. SKIN:  Warm and dry.  Brisk refill. NEURO:  Alert and oriented x3.  Follows commands.  Moves all extremities.  LABORATORY DATA:  Sodium 143, potassium 4.3, BUN 38, creatinine 2, glucose 209.  Hemoglobin 10.6, hematocrit 32, platelets 195, retic 5.5. CK-MB  146 with MB 226.  Troponin I less than 0.30.  Chest x-ray postoperative changes and volume loss in the left hemithorax with mild bibasilar scarring.  EKG, sinus rhythm, nonspecific ST changes.  IMPRESSION: 1. Chest pain, unstable angina, rule out myocardial infarction. 2. Diabetes mellitus. 3. Coronary artery disease with prior stents and myocardial     infarction.  Previous cardiomyopathy, now resolved. 4. Pain today resolved with nebulizer.  PLAN:  Admit to rule out MI.  Serial CK-MBs.  IV nitro and heparin. Cath versus nuclear study.  I have ordered a Lexiscan Myoview for tomorrow as her creatinine is 2.0.  I have held her Avapro for now, and given her fluids, I have continued her Lasix.  The MD will see and make further adjustments.  If cardiac enzymes do come back positive, we will change this to cardiac cath, but for now nuclear stress test may be the best study for her. She is on IV heparin and nitro as stated.     Darcella Gasman. Annie Paras, N.P.   ______________________________ Thurmon Fair, MD    LRI/MEDQ  D:  09/19/2010  T:  09/19/2010  Job:  [id number]  Electronically Signed by Nada Boozer N.P. on 09/20/2010 11:37:26 AM Electronically Signed by Thurmon Fair M.D. on 09/24/2010 07:16:16 AM

## 2010-10-03 NOTE — Discharge Summary (Signed)
[name], [name]              ACCOUNT NO.:  [id number]  MEDICAL RECORD NO.:  [id number]  LOCATION:  1423                         FACILITY:  [facility]  PHYSICIAN:  Kathlen Mody, MD       DATE OF BIRTH:  1945/11/04  DATE OF ADMISSION:  09/09/2010 DATE OF DISCHARGE:  09/14/2010                              DISCHARGE SUMMARY   DISCHARGE DIAGNOSES: 1. Chest pain. 2. Coronary artery disease, status post percutaneous coronary     intervention to proximal left anterior descending. 3. Diastolic heart failure, compensated. 4. Pulmonary hypertension. 5. Insulin-dependent diabetes mellitus. 6. Acute-on-chronic renal insufficiency/stage III chronic     insufficiency. 7. Left lower lobe nodule, status post video-assisted thoracoscopic     surgery in January 2012. 8. Bradycardia with frequent premature ventricle contractions. 9. History of paroxysmal atrial fibrillation. 10.Hypertension. 11.Asthma. 12.Obstructive sleep apnea. 13.Dyslipidemia. 14.Obesity. 15.Glaucoma.  DISCHARGE MEDICATIONS:  To be dictated by the discharging physician.  CONSULTS:  Cardiology consult was called from [facility].  PERTINENT LABS:  On admission, the patient had a CBC done which shows a hemoglobin of 10.6, hematocrit of 31, platelets of 254,000, INR of 1.02. Comprehensive metabolic panel significant for a creatinine of 2.08, BUN of 36, potassium of 3.4.  Cardiac enzymes have been negative x3.  Lipid profile within normal limits.  Hemoglobin A1c 5.9.  TSH was 5.392, T3 and free T4 within normal limits.  Repeat CBC shows a hemoglobin of 10 with hematocrit of 30.9.  Basic metabolic panel shows a creatinine of 2, BUN of 37.  Rest of the electrolytes within normal limits.  RADIOLOGY:  The patient had a chest x-ray which showed persistent elevation of the left hemidiaphragm with mild left basilar atelectasis, postoperative changes at left hilum.  Lungs otherwise clear.  Ultrasound of the  kidney shows echogenic renal parenchyma.  No masses or hydronephrosis.  Echocardiogram shows an ejection fraction of 55% to 60%.  No regional wall abnormalities.  PVCs limit evaluation of LV diastolic function, elevated mean left atrial pressure and pulmonary artery pressures are mild to moderately increased with a peak pressure of 45 mmHg.  BRIEF HOSPITAL COURSE:  I have a 65 year old pleasant lady with extensive cardiac history with coronary artery disease status post PCI with a recent stent placement in March 2012 and was admitted for chest pain.  She was admitted to telemetry and acute coronary syndrome ruled out.  Chest pain most likely secondary to recurrent PVCs.  The patient at this time is continued on her aspirin and Plavix and Coreg 3.125 twice a day, Crestor of 10 mg and Benicar of 10 mg daily. 1. Acute-on-chronic renal insufficiency.  The patient's creatinine in     65 was 1.6 to 1.7.  Her creatinine on admission was 2.  We got an     ultrasound to rule out obstruction.  Did not show any     hydronephrosis.  Decreased Lantus from 80 b.i.d. to 80.  Her     creatinine remains the same.  Will further decrease the Lasix to 60     mg daily.  See if her creatinine improves. 2. Diastolic heart failure.  The patient is on 21  mg of Lasix at this     time, will further decrease it to 60 mg of Lasix for her     creatinine.  She is compensated at this time and potassium     supplements as needed. 3. Insulin-dependent diabetes.  She is on 65 units of Lantus at home.     She has been hypoglycemic for the last 2 days with blood sugars in     60 to 70s.  Her Lantus was cut down to 55 units and her a.m. CBG     was 69.  We are going to further cut it down to 25 units daily, as     she has this acute-on-chronic renal failure that is allowing Lantus     to stay in her body.  To cover her sliding scale only, the CBGs are     more than 200. 4. Paroxysmal atrial fibrillation, on amiodarone  200 mg daily and her     atrial fibrillation is rate controlled at this time. 5. Asthma/obstructive sleep apnea.  She is on Advair and albuterol     nebulizers as needed. 6. Iron-deficiency anemia, on iron supplements. 7. Hypertension, controlled.  Continue her home medications.  PHYSICAL EXAMINATION:  VITAL SIGNS:  She has a temperature of 97.8, pulse of 53, respirations 18, blood pressure 106/62, saturating 96% on room air. GENERAL:  She is alert, afebrile, oriented x3, comfortable in no acute distress. CARDIOVASCULAR:  S1 and S2 heard. RESPIRATORY:  Chest is clear to auscultation bilaterally. ABDOMEN:  Soft, nontender, nondistended. EXTREMITIES:  No pedal edema.  The patient can be discharged possibly on Aug 14, 2010.  If her CBGs are better controlled, she can be discharged to follow up with the cardiology/PCP in about 1 to 2 weeks and she is recommended to follow the BMP in about 1 to 2 weeks, to follow up her creatinine at this time.          ______________________________ Kathlen Mody, MD     VA/MEDQ  D:  09/13/2010  T:  09/13/2010  Job:  [id number]  Electronically Signed by Kathlen Mody MD on 10/03/2010 02:13:10 AM

## 2010-10-04 NOTE — Cardiovascular Report (Signed)
NAMEKHYLER, URDA NO.:  [id number]  MEDICAL RECORD NO.:  [id number]  LOCATION:  6533                         FACILITY:  [facility]  PHYSICIAN:  Nanetta Batty, M.D.   DATE OF BIRTH:  03-24-1946  DATE OF PROCEDURE:  09/21/2010 DATE OF DISCHARGE:                           CARDIAC CATHETERIZATION   PROCEDURE:  Cardiac catheterization.  OPERATOR:  Nanetta Batty, MD  INDICATIONS:  Ms. Cortese is a 65 year old mildly overweight African American female with a history of CAD status post LAD stenting in the past.  She has positive cardiac risk factors.  She was cath'ed a year ago, found to have a patent LAD stent with some diagonal branch disease. She has had several admissions for chest pain rule out MI with a negative Myoview earlier this year.  She was admitted on September 19, 2010 after just having been in the hospital 5 days before for unstable angina.  She ruled out for myocardial infarction.  She presents now for diagnostic coronary arteriography to define anatomy and rule out ischemic etiology.  DESCRIPTION OF PROCEDURE:  The patient brought to the second floor [facility] cardiac cath lab in the postabsorptive state.  She was premedicated with p.o. Valium, IV Versed, and fentanyl.  She was premedicated for IV contrast allergy prophylaxis with IV Benadryl, Pepcid, and Solu-Medrol. Her right groin was prepped and shaved in the usual sterile fashion. Xylocaine 1% was used for local anesthesia.  A 5-French sheath was inserted into the right femoral artery using standard Seldinger technique.  A 5-French right-left Judkins diagnostic catheter as well as 5-French pigtail catheter were used for selective coronary angiography and left ventriculography respectively.  Visipaque dye was used for the entirety of the case.  Retrograde aortic, left ventricular pullback pressures were recorded.  HEMODYNAMICS: 1. Aortic systolic pressure 208, diastolic pressure 82. 2. Left  ventricular systolic pressure 199, end-diastolic pressure 28.  SELECTIVE CORONARY ANGIOGRAPHY: 1. Left main normal. 2. LAD; the proximal LAD stent was widely patent.  There was a 30%     stenosis in the LAD just beyond the stent.  The diagonal branch     arising from the stented segment was widely patent as well.  It was     moderate in size. 3. Left circumflex; nondominant and free of significant disease.  It     did gave off a moderate-sized first marginal branch in the ramus     distribution. 4. Right coronary artery; dominant vessel with a 40% proximal stenosis     in the first bend. 5. Left ventriculography; RAO left ventriculogram was performed using     25 mL of Visipaque dye at 12 mL per second.  The overall LVEF was     estimated at approximately 40% with mild global hypokinesia.  It     should be noted that echo performed on September 11, 2010 revealed an EF     of 55-60%.  IMPRESSION:  Ms. Threats has chest pain of unclear etiology.  I do not think that she has coronary artery disease that could cause this.  She does have elevated end-diastolic pressure.  Continued medical therapy will be recommended including antireflux medications.  ACT was  measured and the sheath was removed.  Pressure was held on the groin to achieve hemostasis.  The patient left the lab in stable condition.  She will be gently hydrated, will remain in the hospital overnight, and probably discharge home in the morning.     Nanetta Batty, M.D.     JB/MEDQ  D:  09/21/2010  T:  09/22/2010  Job:  [id number]  cc:   Second Floor [facility] Thurmon Fair, MD  Electronically Signed by Nanetta Batty M.D. on 10/04/2010 09:22:40 AM

## 2010-10-06 NOTE — Discharge Summary (Signed)
  [name], [name]              ACCOUNT NO.:  [id number]  MEDICAL RECORD NO.:  [id number]  LOCATION:  1233                         FACILITY:  [facility]  PHYSICIAN:  Thurmon Fair, MD     DATE OF BIRTH:  Sep 19, 1945  DATE OF ADMISSION:  09/19/2010 DATE OF DISCHARGE:  09/21/2010                              DISCHARGE SUMMARY   DISCHARGE DIAGNOSES: 1. Chest pain worrisome for unstable angina, no in-stent restenosis at     catheterization this admission with no other critical coronary     disease noted. 2. Cardiomyopathy, her ejection fraction was 40% at catheterization,     but 55-60% by echocardiogram this admission. 3. Pulmonary hypertension with pulmonary artery pressures of 45 mmHg. 4. Known coronary disease with the diagonal bare-metal stenting in     August 2009 in [place], anterior myocardial infarction treated with     left anterior descending, bare-metal stenting with subsequent     jailing of that diagonal in January 2011 in [place], relook in     [place] February 2011 showing patent stent site and a negative     Myoview in February 2012 in [place]. 5. Paroxysmal atrial fibrillation, the patient was recently put on     amiodarone in [place], she is intolerant to beta-blockers because     of bradycardia and her Coreg was discontinued this admission, she     is not on Coumadin possibly secondary to questionable compliance. 6. Hypertension with suboptimal control, ARB resumed at discharged. 7. Stage III chronic renal insufficiency with a creatinine of 1.65. 8. Type 2 insulin-dependent diabetes. 9. Left lower lobe nodule, status post video-assisted thoracoscopic     surgery in [place], January 2012. 10.Chronic obstructive pulmonary disease. 11.Obesity.  PLAN:  The patient will be discharged today.  We will resume her diuretic and ARB.  She will need to follow up with Dr. Royann Shivers.  At this time, her chest pain is noncardiac.  We will send her home on a PPI.     Abelino Derrick, P.A.   ______________________________ Thurmon Fair, MD    LKK/MEDQ  D:  09/22/2010  T:  09/23/2010  Job:  [id number]  cc:   Kerin Perna, M.D.  Electronically Signed by Thurmon Fair M.D. on 10/06/2010 04:25:23 PM

## 2010-10-21 ENCOUNTER — Emergency Department (HOSPITAL_COMMUNITY)
Admission: EM | Admit: 2010-10-21 | Discharge: 2010-10-21 | Disposition: A | Discharge status: Home / Self Care | Payer: Medicare Other | Attending: Emergency Medicine | Admitting: Emergency Medicine

## 2010-10-21 ENCOUNTER — Emergency Department (HOSPITAL_COMMUNITY): Payer: Medicare Other

## 2010-10-21 DIAGNOSIS — N186 End stage renal disease: Secondary | ICD-10-CM | POA: Insufficient documentation

## 2010-10-21 DIAGNOSIS — R609 Edema, unspecified: Secondary | ICD-10-CM | POA: Insufficient documentation

## 2010-10-21 DIAGNOSIS — R0989 Other specified symptoms and signs involving the circulatory and respiratory systems: Secondary | ICD-10-CM | POA: Insufficient documentation

## 2010-10-21 DIAGNOSIS — E119 Type 2 diabetes mellitus without complications: Secondary | ICD-10-CM | POA: Insufficient documentation

## 2010-10-21 DIAGNOSIS — R0601 Orthopnea: Secondary | ICD-10-CM | POA: Insufficient documentation

## 2010-10-21 DIAGNOSIS — E7800 Pure hypercholesterolemia, unspecified: Secondary | ICD-10-CM | POA: Insufficient documentation

## 2010-10-21 DIAGNOSIS — R0609 Other forms of dyspnea: Secondary | ICD-10-CM | POA: Insufficient documentation

## 2010-10-21 DIAGNOSIS — I2510 Atherosclerotic heart disease of native coronary artery without angina pectoris: Secondary | ICD-10-CM | POA: Insufficient documentation

## 2010-10-21 DIAGNOSIS — I509 Heart failure, unspecified: Secondary | ICD-10-CM | POA: Insufficient documentation

## 2010-10-21 DIAGNOSIS — I120 Hypertensive chronic kidney disease with stage 5 chronic kidney disease or end stage renal disease: Secondary | ICD-10-CM | POA: Insufficient documentation

## 2010-10-21 DIAGNOSIS — Z79899 Other long term (current) drug therapy: Secondary | ICD-10-CM | POA: Insufficient documentation

## 2010-10-21 DIAGNOSIS — Z7982 Long term (current) use of aspirin: Secondary | ICD-10-CM | POA: Insufficient documentation

## 2010-10-21 DIAGNOSIS — R0602 Shortness of breath: Secondary | ICD-10-CM | POA: Insufficient documentation

## 2010-10-21 DIAGNOSIS — R0789 Other chest pain: Secondary | ICD-10-CM | POA: Insufficient documentation

## 2010-10-21 DIAGNOSIS — Z794 Long term (current) use of insulin: Secondary | ICD-10-CM | POA: Insufficient documentation

## 2010-10-21 LAB — POCT I-STAT, CHEM 8
BUN: 26 mg/dL — ABNORMAL HIGH (ref 6–23)
Calcium, Ion: 1.16 mmol/L (ref 1.12–1.32)
HCT: 32 % — ABNORMAL LOW (ref 36.0–46.0)
Hemoglobin: 10.9 g/dL — ABNORMAL LOW (ref 12.0–15.0)
Sodium: 144 mEq/L (ref 135–145)
TCO2: 26 mmol/L (ref 0–100)

## 2010-10-21 LAB — CBC
Hemoglobin: 11.1 g/dL — ABNORMAL LOW (ref 12.0–15.0)
MCH: 30.6 pg (ref 26.0–34.0)
Platelets: 188 10*3/uL (ref 150–400)
RBC: 3.63 MIL/uL — ABNORMAL LOW (ref 3.87–5.11)

## 2010-10-21 LAB — DIFFERENTIAL
Basophils Absolute: 0 10*3/uL (ref 0.0–0.1)
Basophils Relative: 1 % (ref 0–1)
Eosinophils Absolute: 0.1 10*3/uL (ref 0.0–0.7)
Monocytes Relative: 11 % (ref 3–12)
Neutro Abs: 3.9 10*3/uL (ref 1.7–7.7)
Neutrophils Relative %: 54 % (ref 43–77)

## 2010-10-21 LAB — CK TOTAL AND CKMB (NOT AT ARMC)
CK, MB: 2.3 ng/mL (ref 0.3–4.0)
Total CK: 175 U/L (ref 7–177)

## 2010-10-21 LAB — TROPONIN I: Troponin I: <0.3 ng/mL (ref <0.30)

## 2010-10-31 ENCOUNTER — Inpatient Hospital Stay (HOSPITAL_COMMUNITY)
Admission: EM | Admit: 2010-10-31 | Discharge: 2010-11-03 | DRG: 293 | Disposition: A | Discharge status: Home / Self Care | Payer: Medicare Other | Attending: Cardiovascular Disease | Admitting: Cardiovascular Disease

## 2010-10-31 ENCOUNTER — Emergency Department (HOSPITAL_COMMUNITY): Payer: Medicare Other

## 2010-10-31 DIAGNOSIS — Z9861 Coronary angioplasty status: Secondary | ICD-10-CM

## 2010-10-31 DIAGNOSIS — J4489 Other specified chronic obstructive pulmonary disease: Secondary | ICD-10-CM | POA: Diagnosis present

## 2010-10-31 DIAGNOSIS — N183 Chronic kidney disease, stage 3 unspecified: Secondary | ICD-10-CM | POA: Diagnosis present

## 2010-10-31 DIAGNOSIS — E669 Obesity, unspecified: Secondary | ICD-10-CM | POA: Diagnosis present

## 2010-10-31 DIAGNOSIS — I5033 Acute on chronic diastolic (congestive) heart failure: Principal | ICD-10-CM | POA: Diagnosis present

## 2010-10-31 DIAGNOSIS — E785 Hyperlipidemia, unspecified: Secondary | ICD-10-CM | POA: Diagnosis present

## 2010-10-31 DIAGNOSIS — I509 Heart failure, unspecified: Secondary | ICD-10-CM | POA: Diagnosis present

## 2010-10-31 DIAGNOSIS — Z794 Long term (current) use of insulin: Secondary | ICD-10-CM

## 2010-10-31 DIAGNOSIS — E119 Type 2 diabetes mellitus without complications: Secondary | ICD-10-CM | POA: Diagnosis present

## 2010-10-31 DIAGNOSIS — I4891 Unspecified atrial fibrillation: Secondary | ICD-10-CM | POA: Diagnosis present

## 2010-10-31 DIAGNOSIS — I252 Old myocardial infarction: Secondary | ICD-10-CM

## 2010-10-31 DIAGNOSIS — H409 Unspecified glaucoma: Secondary | ICD-10-CM | POA: Diagnosis present

## 2010-10-31 DIAGNOSIS — G4733 Obstructive sleep apnea (adult) (pediatric): Secondary | ICD-10-CM | POA: Diagnosis present

## 2010-10-31 DIAGNOSIS — R079 Chest pain, unspecified: Secondary | ICD-10-CM | POA: Diagnosis present

## 2010-10-31 DIAGNOSIS — I2789 Other specified pulmonary heart diseases: Secondary | ICD-10-CM | POA: Diagnosis present

## 2010-10-31 DIAGNOSIS — I2510 Atherosclerotic heart disease of native coronary artery without angina pectoris: Secondary | ICD-10-CM | POA: Diagnosis present

## 2010-10-31 DIAGNOSIS — J449 Chronic obstructive pulmonary disease, unspecified: Secondary | ICD-10-CM | POA: Diagnosis present

## 2010-10-31 DIAGNOSIS — I129 Hypertensive chronic kidney disease with stage 1 through stage 4 chronic kidney disease, or unspecified chronic kidney disease: Secondary | ICD-10-CM | POA: Diagnosis present

## 2010-10-31 LAB — BASIC METABOLIC PANEL
CO2: 26 mEq/L (ref 19–32)
Chloride: 103 mEq/L (ref 96–112)
Glucose, Bld: 166 mg/dL — ABNORMAL HIGH (ref 70–99)
Potassium: 3.8 mEq/L (ref 3.5–5.1)
Sodium: 140 mEq/L (ref 135–145)

## 2010-10-31 LAB — DIFFERENTIAL
Basophils Relative: 0 % (ref 0–1)
Eosinophils Relative: 2 % (ref 0–5)
Lymphocytes Relative: 26 % (ref 12–46)
Monocytes Absolute: 0.6 10*3/uL (ref 0.1–1.0)
Monocytes Relative: 8 % (ref 3–12)
Neutro Abs: 5.3 10*3/uL (ref 1.7–7.7)

## 2010-10-31 LAB — CBC
HCT: 35.9 % — ABNORMAL LOW (ref 36.0–46.0)
Hemoglobin: 12.4 g/dL (ref 12.0–15.0)
MCH: 30.1 pg (ref 26.0–34.0)
MCHC: 34.5 g/dL (ref 30.0–36.0)
RDW: 13.8 % (ref 11.5–15.5)

## 2010-11-01 LAB — GLUCOSE, CAPILLARY
Glucose-Capillary: 143 mg/dL — ABNORMAL HIGH (ref 70–99)
Glucose-Capillary: 198 mg/dL — ABNORMAL HIGH (ref 70–99)
Glucose-Capillary: 203 mg/dL — ABNORMAL HIGH (ref 70–99)

## 2010-11-01 LAB — URINALYSIS, ROUTINE W REFLEX MICROSCOPIC
Glucose, UA: NEGATIVE mg/dL
Leukocytes, UA: NEGATIVE
Protein, ur: >300 mg/dL — AB
Specific Gravity, Urine: 1.009 (ref 1.005–1.030)
pH: 7.5 (ref 5.0–8.0)

## 2010-11-01 LAB — LIPID PANEL
HDL: 47 mg/dL (ref >39)
LDL Cholesterol: 84 mg/dL (ref 0–99)

## 2010-11-01 LAB — COMPREHENSIVE METABOLIC PANEL
ALT: 10 U/L (ref 0–35)
AST: 11 U/L (ref 0–37)
Alkaline Phosphatase: 113 U/L (ref 39–117)
CO2: 25 mEq/L (ref 19–32)
Calcium: 9.2 mg/dL (ref 8.4–10.5)
Chloride: 104 mEq/L (ref 96–112)
GFR calc Af Amer: 42 mL/min — ABNORMAL LOW (ref >60)
GFR calc non Af Amer: 35 mL/min — ABNORMAL LOW (ref >60)
Glucose, Bld: 226 mg/dL — ABNORMAL HIGH (ref 70–99)
Potassium: 3.5 mEq/L (ref 3.5–5.1)
Sodium: 139 mEq/L (ref 135–145)

## 2010-11-01 LAB — TSH: TSH: 5.457 u[IU]/mL — ABNORMAL HIGH (ref 0.350–4.500)

## 2010-11-01 LAB — PRO B NATRIURETIC PEPTIDE: Pro B Natriuretic peptide (BNP): 509.1 pg/mL — ABNORMAL HIGH (ref 0–125)

## 2010-11-01 LAB — CARDIAC PANEL(CRET KIN+CKTOT+MB+TROPI): CK, MB: 2.1 ng/mL (ref 0.3–4.0)

## 2010-11-01 LAB — URINE MICROSCOPIC-ADD ON

## 2010-11-01 LAB — CBC
Hemoglobin: 10.7 g/dL — ABNORMAL LOW (ref 12.0–15.0)
MCH: 29.4 pg (ref 26.0–34.0)
MCHC: 33.9 g/dL (ref 30.0–36.0)

## 2010-11-01 LAB — HEMOGLOBIN A1C: Mean Plasma Glucose: 146 mg/dL — ABNORMAL HIGH (ref <117)

## 2010-11-02 LAB — BASIC METABOLIC PANEL
CO2: 27 mEq/L (ref 19–32)
Calcium: 9.1 mg/dL (ref 8.4–10.5)
GFR calc non Af Amer: 25 mL/min — ABNORMAL LOW (ref >60)
Potassium: 4.1 mEq/L (ref 3.5–5.1)
Sodium: 140 mEq/L (ref 135–145)

## 2010-11-02 LAB — GLUCOSE, CAPILLARY
Glucose-Capillary: 226 mg/dL — ABNORMAL HIGH (ref 70–99)
Glucose-Capillary: 146 mg/dL — ABNORMAL HIGH (ref 70–99)

## 2010-11-02 LAB — URINE CULTURE: Culture  Setup Time: 201207240952

## 2010-11-02 NOTE — Discharge Summary (Signed)
  Wanda [name], Wanda [name]              ACCOUNT NO.:  [id number]  MEDICAL RECORD NO.:  [id number]  LOCATION:  6533                         FACILITY:  [facility]  PHYSICIAN:  Nanetta Batty, M.D.   DATE OF BIRTH:  Jan 04, 1946  DATE OF ADMISSION:  09/21/2010 DATE OF DISCHARGE:  09/22/2010                              DISCHARGE SUMMARY   ADDENDUM  DISCHARGE DIAGNOSIS:  [name] has a history of IV CONTRAST allergy, she says she has had some periorbital swelling from contrast and previous catheterizations.  She was premedicated with steroids, Pepcid, and Benadryl prior to her catheterization this admission and tolerated this well.     Abelino Derrick, P.A.   ______________________________ Nanetta Batty, M.D.    Lenard Lance  D:  09/22/2010  T:  09/23/2010  Job:  [id number]  Electronically Signed by Corine Shelter P.A. on 10/10/2010 11:02:22 AM Electronically Signed by Nanetta Batty M.D. on 11/02/2010 03:12:30 PM

## 2010-11-03 LAB — GLUCOSE, CAPILLARY: Glucose-Capillary: 118 mg/dL — ABNORMAL HIGH (ref 70–99)

## 2010-11-03 LAB — BASIC METABOLIC PANEL
Calcium: 9.3 mg/dL (ref 8.4–10.5)
Creatinine, Ser: 2.1 mg/dL — ABNORMAL HIGH (ref 0.50–1.10)
GFR calc Af Amer: 29 mL/min — ABNORMAL LOW (ref >60)
GFR calc non Af Amer: 24 mL/min — ABNORMAL LOW (ref >60)

## 2010-12-01 ENCOUNTER — Emergency Department (HOSPITAL_COMMUNITY)
Admission: EM | Admit: 2010-12-01 | Discharge: 2010-12-01 | Disposition: A | Discharge status: Home / Self Care | Payer: PRIVATE HEALTH INSURANCE | Attending: Emergency Medicine | Admitting: Emergency Medicine

## 2010-12-01 ENCOUNTER — Emergency Department (HOSPITAL_COMMUNITY): Payer: PRIVATE HEALTH INSURANCE

## 2010-12-01 DIAGNOSIS — R059 Cough, unspecified: Secondary | ICD-10-CM | POA: Insufficient documentation

## 2010-12-01 DIAGNOSIS — E669 Obesity, unspecified: Secondary | ICD-10-CM | POA: Insufficient documentation

## 2010-12-01 DIAGNOSIS — R42 Dizziness and giddiness: Secondary | ICD-10-CM | POA: Insufficient documentation

## 2010-12-01 DIAGNOSIS — R51 Headache: Secondary | ICD-10-CM | POA: Insufficient documentation

## 2010-12-01 DIAGNOSIS — I129 Hypertensive chronic kidney disease with stage 1 through stage 4 chronic kidney disease, or unspecified chronic kidney disease: Secondary | ICD-10-CM | POA: Insufficient documentation

## 2010-12-01 DIAGNOSIS — D649 Anemia, unspecified: Secondary | ICD-10-CM | POA: Insufficient documentation

## 2010-12-01 DIAGNOSIS — R05 Cough: Secondary | ICD-10-CM | POA: Insufficient documentation

## 2010-12-01 DIAGNOSIS — Z79899 Other long term (current) drug therapy: Secondary | ICD-10-CM | POA: Insufficient documentation

## 2010-12-01 DIAGNOSIS — R0602 Shortness of breath: Secondary | ICD-10-CM | POA: Insufficient documentation

## 2010-12-01 DIAGNOSIS — R5381 Other malaise: Secondary | ICD-10-CM | POA: Insufficient documentation

## 2010-12-01 DIAGNOSIS — I2510 Atherosclerotic heart disease of native coronary artery without angina pectoris: Secondary | ICD-10-CM | POA: Insufficient documentation

## 2010-12-01 DIAGNOSIS — N189 Chronic kidney disease, unspecified: Secondary | ICD-10-CM | POA: Insufficient documentation

## 2010-12-01 DIAGNOSIS — R079 Chest pain, unspecified: Secondary | ICD-10-CM | POA: Insufficient documentation

## 2010-12-01 DIAGNOSIS — E119 Type 2 diabetes mellitus without complications: Secondary | ICD-10-CM | POA: Insufficient documentation

## 2010-12-01 LAB — URINALYSIS, ROUTINE W REFLEX MICROSCOPIC
Hgb urine dipstick: NEGATIVE
Leukocytes, UA: NEGATIVE
Nitrite: NEGATIVE
Protein, ur: 100 mg/dL — AB
Specific Gravity, Urine: 1.014 (ref 1.005–1.030)
Urobilinogen, UA: 0.2 mg/dL (ref 0.0–1.0)

## 2010-12-01 LAB — DIFFERENTIAL
Basophils Absolute: 0 10*3/uL (ref 0.0–0.1)
Basophils Relative: 0 % (ref 0–1)
Eosinophils Relative: 2 % (ref 0–5)
Lymphocytes Relative: 30 % (ref 12–46)
Neutro Abs: 3.6 10*3/uL (ref 1.7–7.7)

## 2010-12-01 LAB — COMPREHENSIVE METABOLIC PANEL
Albumin: 3.4 g/dL — ABNORMAL LOW (ref 3.5–5.2)
BUN: 31 mg/dL — ABNORMAL HIGH (ref 6–23)
Calcium: 9.5 mg/dL (ref 8.4–10.5)
Creatinine, Ser: 1.99 mg/dL — ABNORMAL HIGH (ref 0.50–1.10)
GFR calc Af Amer: 30 mL/min — ABNORMAL LOW (ref >60)
Glucose, Bld: 132 mg/dL — ABNORMAL HIGH (ref 70–99)
Potassium: 4.1 mEq/L (ref 3.5–5.1)
Total Protein: 6.8 g/dL (ref 6.0–8.3)

## 2010-12-01 LAB — URINE MICROSCOPIC-ADD ON

## 2010-12-01 LAB — CBC
HCT: 31.8 % — ABNORMAL LOW (ref 36.0–46.0)
Hemoglobin: 10.6 g/dL — ABNORMAL LOW (ref 12.0–15.0)
MCHC: 33.3 g/dL (ref 30.0–36.0)
RDW: 14.4 % (ref 11.5–15.5)
WBC: 6.2 10*3/uL (ref 4.0–10.5)

## 2010-12-01 LAB — LIPASE, BLOOD: Lipase: 25 U/L (ref 11–59)

## 2010-12-01 LAB — TROPONIN I: Troponin I: <0.3 ng/mL (ref <0.30)

## 2010-12-01 LAB — PRO B NATRIURETIC PEPTIDE: Pro B Natriuretic peptide (BNP): 816.6 pg/mL — ABNORMAL HIGH (ref 0–125)

## 2010-12-11 NOTE — Discharge Summary (Signed)
Wanda [name], Wanda [name]              ACCOUNT NO.:  [id number]  MEDICAL RECORD NO.:  [id number]  LOCATION:  4707                         FACILITY:  [facility]  PHYSICIAN:  Dr. Clarene Duke             DATE OF BIRTH:  02-03-1946  DATE OF ADMISSION:  10/31/2010 DATE OF DISCHARGE:  11/03/2010                              DISCHARGE SUMMARY   PRIMARY CARE DOCTOR:  Dr. Selena Batten.  DISCHARGE DIAGNOSES: 1. Chest pain, resolved, cardiac enzymes negative x2. 2. Normal ejection fraction, 55%-60%. 3. Coronary artery disease status post bare-metal stent in August     2009, apparently in [place], negative in-stent restenosis in June     2012. 4. Pulmonary hypertension, peak PA pressure is 45 mmHg. 5. History of paroxysmal atrial fibrillation, currently on amiodarone. 6. Hypertension. 7. Chronic renal insufficiency, stage III. 8. Obesity. 9. Left lower lung nodule status post surgery, January 2012.  HOSPITAL COURSE:  Wanda [name] is a 65 year old, obese, African American female with history of coronary artery disease status post bare-metal stent to the diagonal and bare-metal stent to the LAD, diastolic dysfunction, hypertension, chronic renal insufficiency stage III, diabetes mellitus type 2, left lower lung nodule status post VATS in January of this year, pulmonary hypertension, COPD, obesity, dyslipidemia, obstructive sleep apnea, glaucoma, and paroxysmal atrial fibrillation.  She presented with increased edema in her lower extremities from Dr. Elmyra Ricks office.  She also was complaining of shortness of breath and her BNP was slightly elevated.  She also began to experience substernal chest tightness and pain, subsequently came into the ED for evaluation.  She was admitted to rule tout acute coronary syndrome, started on IV nitroglycerin, subcu Lovenox, aspirin, continued on Plavix.  She was also given IV hydralazine and Lasix. Cardiac enzymes were negative.  The patient continued to improve.  She stated her  lower extremity edema was better and chest pain was better. She was switched to Lasix 80 mg b.i.d. and then to 80 mg daily.  Blood pressure had been slightly elevated at 166/60.  Her hydralazine was subsequently increased to 50 mg q.6 h.  As of today, she is chest pain- free with improvement in lower extremity edema.  Dr. Clarene Duke discussed the daily weights and fluid and sodium restrictions.  He feels she is stable for discharge home.  DISCHARGE LABS:  WBC 7.2, hemoglobin 10.7, hematocrit 31.6, platelets 221.  Sodium 142, potassium 4.2, chloride 107, carbon dioxide 28, glucose 125, BUN 26, creatinine 2.10, calcium 9.3.  Her hemoglobin A1c was 6.7.  BNP on July 24 was 509.  Total cholesterol 167, triglycerides 179, HDL 47, LDL 84, VLDL 36, total cholesterol-HDL ratio was 3.6. Thyroid stimulating hormone is slightly elevated at 5.457.  Urinalysis showed protein greater than 300, few squamous, few bacteria.  She is MRSA negative and urine culture negative for growth.  STUDIES/PROCEDURES:  Chest x-ray, October 31, 2010, showed stable postoperative changes in left hemithorax.  No acute disease.  DISCHARGE MEDICATIONS: 1. Hydralazine 50 mg one tablet by mouth every 6 hours. 2. Imdur 30 mg one tablet by mouth daily. 3. Lasix 40 mg one tablet by mouth daily. 4. Olmesartan 20 mg one half tablet  by mouth daily. 5. Acetaminophen 325 mg two tablets by mouth every 4 hours as needed     for pain. 6. Advair Diskus 250/50 mcg one puff inhaled twice daily. 7. Amiodarone 200 mg one half tablet by mouth daily. 8. Amlodipine 10 mg one tablet by mouth daily. 9. Aspirin enteric-coated 81 mg one tablet by mouth daily. 10.Brimonidine ophthalmic solution 0.2% one drop both eyes twice     daily. 11.Ferrous sulfate 325 mg one tablet by mouth twice daily. 12.Humalog insulin 45 units subcutaneously daily. 13.Hydrocodone/APAP 5/325 mg one tablet by mouth daily at bedtime as     needed for pain. 14.Lantus 25 units  subcutaneously daily. 15.Lumigan 0.03% one drop both eyes daily at bedtime. 16.Maalox 15-30 mL one by mouth every 2 hours as needed for     indigestion. 17.Nitroglycerin sublingual 0.4 mg one tablet under the tongue every 5     minutes up to three doses total for chest pain. 18.Protonix 20 mg one tablet by mouth daily. 19.Plavix 75 mg one tablet by mouth every morning. 20.Potassium chloride 20 mEq one tablet by mouth every morning. 21.Rosuvastatin 10 mg one tablet by mouth daily. 22.Temazepam 15 mg one tablet by mouth daily at bedtime as needed for     sleep. 23.Ultram 50 mg one-two tablets by mouth every 6 hours as needed for     pain. 24.Zemplar 1 mcg one tablet by mouth daily.  DISPOSITION:  Wanda [name] will be discharged home in stable condition. She has no restrictions on activity.  It is recommended she eat a low- sodium heart-healthy diet, low in carbohydrate.  She will follow up with Dr. Royann Shivers in approximately 1-2 weeks and Dr. Selena Batten, her primary care doctor.  She has been instructed or recommended to weigh herself on a daily basis and call our office if she has a 1-2 pound weight gain in 1 day or 3-4 pound weight gain in a weak.    ______________________________ Wilburt Finlay, PA   ______________________________ Dr. Laveda Abbe  D:  11/03/2010  T:  11/03/2010  Job:  [id number]  cc:   Thurmon Fair, MD Dr. Selena Batten  Electronically Signed by Wilburt Finlay PA on 11/07/2010 02:19:21 PM Electronically Signed by Julieanne Manson M.D. on 12/11/2010 02:24:50 PM

## 2010-12-28 ENCOUNTER — Inpatient Hospital Stay (HOSPITAL_COMMUNITY)
Admission: EM | Admit: 2010-12-28 | Discharge: 2011-01-01 | DRG: 292 | Disposition: A | Discharge status: Home / Self Care | Payer: Medicare Other | Attending: Cardiovascular Disease | Admitting: Cardiovascular Disease

## 2010-12-28 ENCOUNTER — Emergency Department (HOSPITAL_COMMUNITY): Payer: Medicare Other

## 2010-12-28 DIAGNOSIS — E669 Obesity, unspecified: Secondary | ICD-10-CM | POA: Diagnosis present

## 2010-12-28 DIAGNOSIS — E7800 Pure hypercholesterolemia, unspecified: Secondary | ICD-10-CM | POA: Diagnosis present

## 2010-12-28 DIAGNOSIS — Z7982 Long term (current) use of aspirin: Secondary | ICD-10-CM

## 2010-12-28 DIAGNOSIS — Z79899 Other long term (current) drug therapy: Secondary | ICD-10-CM

## 2010-12-28 DIAGNOSIS — J984 Other disorders of lung: Secondary | ICD-10-CM | POA: Diagnosis present

## 2010-12-28 DIAGNOSIS — N183 Chronic kidney disease, stage 3 unspecified: Secondary | ICD-10-CM | POA: Diagnosis present

## 2010-12-28 DIAGNOSIS — G4733 Obstructive sleep apnea (adult) (pediatric): Secondary | ICD-10-CM | POA: Diagnosis present

## 2010-12-28 DIAGNOSIS — N179 Acute kidney failure, unspecified: Secondary | ICD-10-CM | POA: Diagnosis present

## 2010-12-28 DIAGNOSIS — E119 Type 2 diabetes mellitus without complications: Secondary | ICD-10-CM | POA: Diagnosis present

## 2010-12-28 DIAGNOSIS — J45909 Unspecified asthma, uncomplicated: Secondary | ICD-10-CM | POA: Diagnosis present

## 2010-12-28 DIAGNOSIS — I129 Hypertensive chronic kidney disease with stage 1 through stage 4 chronic kidney disease, or unspecified chronic kidney disease: Secondary | ICD-10-CM | POA: Diagnosis present

## 2010-12-28 DIAGNOSIS — I5033 Acute on chronic diastolic (congestive) heart failure: Principal | ICD-10-CM | POA: Diagnosis present

## 2010-12-28 DIAGNOSIS — Z87891 Personal history of nicotine dependence: Secondary | ICD-10-CM

## 2010-12-28 DIAGNOSIS — Z9861 Coronary angioplasty status: Secondary | ICD-10-CM

## 2010-12-28 DIAGNOSIS — Z7902 Long term (current) use of antithrombotics/antiplatelets: Secondary | ICD-10-CM

## 2010-12-28 DIAGNOSIS — I2789 Other specified pulmonary heart diseases: Secondary | ICD-10-CM | POA: Diagnosis present

## 2010-12-28 DIAGNOSIS — I2510 Atherosclerotic heart disease of native coronary artery without angina pectoris: Secondary | ICD-10-CM | POA: Diagnosis present

## 2010-12-28 LAB — DIFFERENTIAL
Basophils Absolute: 0 10*3/uL (ref 0.0–0.1)
Basophils Relative: 0 % (ref 0–1)
Eosinophils Absolute: 0.1 10*3/uL (ref 0.0–0.7)
Monocytes Relative: 9 % (ref 3–12)
Neutro Abs: 3.1 10*3/uL (ref 1.7–7.7)
Neutrophils Relative %: 46 % (ref 43–77)

## 2010-12-28 LAB — CBC
Hemoglobin: 12.7 g/dL (ref 12.0–15.0)
MCH: 29.7 pg (ref 26.0–34.0)
Platelets: 223 10*3/uL (ref 150–400)
RBC: 4.28 MIL/uL (ref 3.87–5.11)

## 2010-12-28 LAB — COMPREHENSIVE METABOLIC PANEL
AST: 13 U/L (ref 0–37)
Albumin: 3.5 g/dL (ref 3.5–5.2)
Alkaline Phosphatase: 112 U/L (ref 39–117)
BUN: 27 mg/dL — ABNORMAL HIGH (ref 6–23)
Chloride: 103 mEq/L (ref 96–112)
Creatinine, Ser: 1.85 mg/dL — ABNORMAL HIGH (ref 0.50–1.10)
Potassium: 3.8 mEq/L (ref 3.5–5.1)
Total Protein: 7.3 g/dL (ref 6.0–8.3)

## 2010-12-28 LAB — PRO B NATRIURETIC PEPTIDE: Pro B Natriuretic peptide (BNP): 688.4 pg/mL — ABNORMAL HIGH (ref 0–125)

## 2010-12-28 LAB — POCT I-STAT TROPONIN I: Troponin i, poc: 0.02 ng/mL (ref 0.00–0.08)

## 2010-12-29 ENCOUNTER — Inpatient Hospital Stay (HOSPITAL_COMMUNITY): Payer: Medicare Other

## 2010-12-29 LAB — BASIC METABOLIC PANEL
CO2: 31 mEq/L (ref 19–32)
Calcium: 9.2 mg/dL (ref 8.4–10.5)
Creatinine, Ser: 1.74 mg/dL — ABNORMAL HIGH (ref 0.50–1.10)
GFR calc non Af Amer: 29 mL/min — ABNORMAL LOW (ref >60)
Glucose, Bld: 113 mg/dL — ABNORMAL HIGH (ref 70–99)
Sodium: 146 mEq/L — ABNORMAL HIGH (ref 135–145)

## 2010-12-29 LAB — LIPID PANEL
HDL: 49 mg/dL (ref >39)
Total CHOL/HDL Ratio: 4 RATIO
Triglycerides: 194 mg/dL — ABNORMAL HIGH (ref <150)

## 2010-12-29 LAB — CARDIAC PANEL(CRET KIN+CKTOT+MB+TROPI)
CK, MB: 2.2 ng/mL (ref 0.3–4.0)
CK, MB: 2 ng/mL (ref 0.3–4.0)
Total CK: 117 U/L (ref 7–177)
Troponin I: <0.3 ng/mL (ref <0.30)

## 2010-12-29 LAB — GLUCOSE, CAPILLARY
Glucose-Capillary: 141 mg/dL — ABNORMAL HIGH (ref 70–99)
Glucose-Capillary: 122 mg/dL — ABNORMAL HIGH (ref 70–99)
Glucose-Capillary: 118 mg/dL — ABNORMAL HIGH (ref 70–99)
Glucose-Capillary: 139 mg/dL — ABNORMAL HIGH (ref 70–99)

## 2010-12-29 LAB — MAGNESIUM: Magnesium: 2.3 mg/dL (ref 1.5–2.5)

## 2010-12-29 LAB — HEPATIC FUNCTION PANEL
ALT: 8 U/L (ref 0–35)
AST: 11 U/L (ref 0–37)
Albumin: 3 g/dL — ABNORMAL LOW (ref 3.5–5.2)

## 2010-12-29 LAB — HEMOGLOBIN A1C
Hgb A1c MFr Bld: 6.8 % — ABNORMAL HIGH (ref <5.7)
Mean Plasma Glucose: 148 mg/dL — ABNORMAL HIGH (ref <117)

## 2010-12-29 LAB — LIPASE, BLOOD: Lipase: 43 U/L (ref 11–59)

## 2010-12-30 ENCOUNTER — Inpatient Hospital Stay (HOSPITAL_COMMUNITY): Payer: Medicare Other

## 2010-12-30 LAB — CBC
Hemoglobin: 11.2 g/dL — ABNORMAL LOW (ref 12.0–15.0)
MCH: 29.9 pg (ref 26.0–34.0)
Platelets: 199 10*3/uL (ref 150–400)
RBC: 3.74 MIL/uL — ABNORMAL LOW (ref 3.87–5.11)
WBC: 6.4 10*3/uL (ref 4.0–10.5)

## 2010-12-30 LAB — BASIC METABOLIC PANEL
CO2: 28 mEq/L (ref 19–32)
Calcium: 9.2 mg/dL (ref 8.4–10.5)
Glucose, Bld: 132 mg/dL — ABNORMAL HIGH (ref 70–99)
Potassium: 3.9 mEq/L (ref 3.5–5.1)
Sodium: 143 mEq/L (ref 135–145)

## 2010-12-30 LAB — GLUCOSE, CAPILLARY
Glucose-Capillary: 132 mg/dL — ABNORMAL HIGH (ref 70–99)
Glucose-Capillary: 152 mg/dL — ABNORMAL HIGH (ref 70–99)
Glucose-Capillary: 161 mg/dL — ABNORMAL HIGH (ref 70–99)

## 2010-12-30 MED ORDER — TECHNETIUM TC 99M TETROFOSMIN IV KIT
10.0000 | PACK | Freq: Once | INTRAVENOUS | Status: AC | PRN
  Administered 2010-12-30: 10 via INTRAVENOUS

## 2010-12-30 MED ORDER — TECHNETIUM TC 99M TETROFOSMIN IV KIT
30.0000 | PACK | Freq: Once | INTRAVENOUS | Status: AC | PRN
  Administered 2010-12-30: 30 via INTRAVENOUS

## 2010-12-31 LAB — GLUCOSE, CAPILLARY
Glucose-Capillary: 196 mg/dL — ABNORMAL HIGH (ref 70–99)
Glucose-Capillary: 81 mg/dL (ref 70–99)
Glucose-Capillary: 101 mg/dL — ABNORMAL HIGH (ref 70–99)

## 2011-01-01 LAB — PRO B NATRIURETIC PEPTIDE: Pro B Natriuretic peptide (BNP): 340.2 pg/mL — ABNORMAL HIGH (ref 0–125)

## 2011-01-01 LAB — BASIC METABOLIC PANEL
Calcium: 9.1 mg/dL (ref 8.4–10.5)
Chloride: 106 mEq/L (ref 96–112)
Creatinine, Ser: 1.99 mg/dL — ABNORMAL HIGH (ref 0.50–1.10)
GFR calc Af Amer: 30 mL/min — ABNORMAL LOW (ref >60)
Sodium: 143 mEq/L (ref 135–145)

## 2011-01-01 LAB — GLUCOSE, CAPILLARY
Glucose-Capillary: 107 mg/dL — ABNORMAL HIGH (ref 70–99)
Glucose-Capillary: 198 mg/dL — ABNORMAL HIGH (ref 70–99)

## 2011-01-03 ENCOUNTER — Emergency Department (HOSPITAL_COMMUNITY)
Admission: EM | Admit: 2011-01-03 | Discharge: 2011-01-04 | Disposition: A | Discharge status: Home / Self Care | Payer: Medicare Other | Attending: Emergency Medicine | Admitting: Emergency Medicine

## 2011-01-03 ENCOUNTER — Emergency Department (HOSPITAL_COMMUNITY): Payer: Medicare Other

## 2011-01-03 DIAGNOSIS — E119 Type 2 diabetes mellitus without complications: Secondary | ICD-10-CM | POA: Insufficient documentation

## 2011-01-03 DIAGNOSIS — I2510 Atherosclerotic heart disease of native coronary artery without angina pectoris: Secondary | ICD-10-CM | POA: Insufficient documentation

## 2011-01-03 DIAGNOSIS — Z9889 Other specified postprocedural states: Secondary | ICD-10-CM | POA: Insufficient documentation

## 2011-01-03 DIAGNOSIS — I129 Hypertensive chronic kidney disease with stage 1 through stage 4 chronic kidney disease, or unspecified chronic kidney disease: Secondary | ICD-10-CM | POA: Insufficient documentation

## 2011-01-03 DIAGNOSIS — Z79899 Other long term (current) drug therapy: Secondary | ICD-10-CM | POA: Insufficient documentation

## 2011-01-03 DIAGNOSIS — N189 Chronic kidney disease, unspecified: Secondary | ICD-10-CM | POA: Insufficient documentation

## 2011-01-03 DIAGNOSIS — R072 Precordial pain: Secondary | ICD-10-CM | POA: Insufficient documentation

## 2011-01-03 DIAGNOSIS — J45909 Unspecified asthma, uncomplicated: Secondary | ICD-10-CM | POA: Insufficient documentation

## 2011-01-03 DIAGNOSIS — K219 Gastro-esophageal reflux disease without esophagitis: Secondary | ICD-10-CM | POA: Insufficient documentation

## 2011-01-03 LAB — POCT I-STAT TROPONIN I

## 2011-01-03 LAB — DIFFERENTIAL
Basophils Absolute: 0 10*3/uL (ref 0.0–0.1)
Eosinophils Relative: 2 % (ref 0–5)
Lymphocytes Relative: 35 % (ref 12–46)
Neutro Abs: 3.1 10*3/uL (ref 1.7–7.7)

## 2011-01-03 LAB — CBC
HCT: 33.3 % — ABNORMAL LOW (ref 36.0–46.0)
RDW: 14.4 % (ref 11.5–15.5)
WBC: 5.8 10*3/uL (ref 4.0–10.5)

## 2011-01-04 LAB — BASIC METABOLIC PANEL
CO2: 30 mEq/L (ref 19–32)
Calcium: 9.5 mg/dL (ref 8.4–10.5)
GFR calc non Af Amer: 23 mL/min — ABNORMAL LOW (ref >60)
Sodium: 143 mEq/L (ref 135–145)

## 2011-01-11 ENCOUNTER — Emergency Department (HOSPITAL_COMMUNITY)
Admission: EM | Admit: 2011-01-11 | Discharge: 2011-01-11 | Disposition: A | Discharge status: Home / Self Care | Payer: Medicare Other | Attending: Emergency Medicine | Admitting: Emergency Medicine

## 2011-01-11 ENCOUNTER — Emergency Department (HOSPITAL_COMMUNITY): Payer: Medicare Other

## 2011-01-11 DIAGNOSIS — R112 Nausea with vomiting, unspecified: Secondary | ICD-10-CM | POA: Insufficient documentation

## 2011-01-11 DIAGNOSIS — I129 Hypertensive chronic kidney disease with stage 1 through stage 4 chronic kidney disease, or unspecified chronic kidney disease: Secondary | ICD-10-CM | POA: Insufficient documentation

## 2011-01-11 DIAGNOSIS — R059 Cough, unspecified: Secondary | ICD-10-CM | POA: Insufficient documentation

## 2011-01-11 DIAGNOSIS — J45909 Unspecified asthma, uncomplicated: Secondary | ICD-10-CM | POA: Insufficient documentation

## 2011-01-11 DIAGNOSIS — R0989 Other specified symptoms and signs involving the circulatory and respiratory systems: Secondary | ICD-10-CM | POA: Insufficient documentation

## 2011-01-11 DIAGNOSIS — E876 Hypokalemia: Secondary | ICD-10-CM | POA: Insufficient documentation

## 2011-01-11 DIAGNOSIS — Z794 Long term (current) use of insulin: Secondary | ICD-10-CM | POA: Insufficient documentation

## 2011-01-11 DIAGNOSIS — R0609 Other forms of dyspnea: Secondary | ICD-10-CM | POA: Insufficient documentation

## 2011-01-11 DIAGNOSIS — I2510 Atherosclerotic heart disease of native coronary artery without angina pectoris: Secondary | ICD-10-CM | POA: Insufficient documentation

## 2011-01-11 DIAGNOSIS — R0602 Shortness of breath: Secondary | ICD-10-CM | POA: Insufficient documentation

## 2011-01-11 DIAGNOSIS — R0789 Other chest pain: Secondary | ICD-10-CM | POA: Insufficient documentation

## 2011-01-11 DIAGNOSIS — E789 Disorder of lipoprotein metabolism, unspecified: Secondary | ICD-10-CM | POA: Insufficient documentation

## 2011-01-11 DIAGNOSIS — N189 Chronic kidney disease, unspecified: Secondary | ICD-10-CM | POA: Insufficient documentation

## 2011-01-11 DIAGNOSIS — R05 Cough: Secondary | ICD-10-CM | POA: Insufficient documentation

## 2011-01-11 DIAGNOSIS — E119 Type 2 diabetes mellitus without complications: Secondary | ICD-10-CM | POA: Insufficient documentation

## 2011-01-11 DIAGNOSIS — Z79899 Other long term (current) drug therapy: Secondary | ICD-10-CM | POA: Insufficient documentation

## 2011-01-11 LAB — DIFFERENTIAL
Basophils Absolute: 0 10*3/uL (ref 0.0–0.1)
Basophils Relative: 0 % (ref 0–1)
Neutro Abs: 3.8 10*3/uL (ref 1.7–7.7)
Neutrophils Relative %: 55 % (ref 43–77)

## 2011-01-11 LAB — BASIC METABOLIC PANEL
Chloride: 107 mEq/L (ref 96–112)
GFR calc Af Amer: 27 mL/min — ABNORMAL LOW (ref >90)
Potassium: 3.3 mEq/L — ABNORMAL LOW (ref 3.5–5.1)

## 2011-01-11 LAB — POCT I-STAT TROPONIN I: Troponin i, poc: 0.01 ng/mL (ref 0.00–0.08)

## 2011-01-11 LAB — PRO B NATRIURETIC PEPTIDE: Pro B Natriuretic peptide (BNP): 480.4 pg/mL — ABNORMAL HIGH (ref 0–125)

## 2011-01-11 LAB — CBC
Hemoglobin: 10.8 g/dL — ABNORMAL LOW (ref 12.0–15.0)
RBC: 3.63 MIL/uL — ABNORMAL LOW (ref 3.87–5.11)
WBC: 6.9 10*3/uL (ref 4.0–10.5)

## 2011-02-06 ENCOUNTER — Emergency Department (HOSPITAL_COMMUNITY)
Admission: EM | Admit: 2011-02-06 | Discharge: 2011-02-07 | Disposition: A | Discharge status: Home / Self Care | Payer: Medicare Other | Attending: Emergency Medicine | Admitting: Emergency Medicine

## 2011-02-06 DIAGNOSIS — Z794 Long term (current) use of insulin: Secondary | ICD-10-CM | POA: Insufficient documentation

## 2011-02-06 DIAGNOSIS — I129 Hypertensive chronic kidney disease with stage 1 through stage 4 chronic kidney disease, or unspecified chronic kidney disease: Secondary | ICD-10-CM | POA: Insufficient documentation

## 2011-02-06 DIAGNOSIS — E119 Type 2 diabetes mellitus without complications: Secondary | ICD-10-CM | POA: Insufficient documentation

## 2011-02-06 DIAGNOSIS — R0989 Other specified symptoms and signs involving the circulatory and respiratory systems: Secondary | ICD-10-CM | POA: Insufficient documentation

## 2011-02-06 DIAGNOSIS — N189 Chronic kidney disease, unspecified: Secondary | ICD-10-CM | POA: Insufficient documentation

## 2011-02-06 DIAGNOSIS — E7800 Pure hypercholesterolemia, unspecified: Secondary | ICD-10-CM | POA: Insufficient documentation

## 2011-02-06 DIAGNOSIS — Z7982 Long term (current) use of aspirin: Secondary | ICD-10-CM | POA: Insufficient documentation

## 2011-02-06 DIAGNOSIS — F411 Generalized anxiety disorder: Secondary | ICD-10-CM | POA: Insufficient documentation

## 2011-02-06 DIAGNOSIS — Z79899 Other long term (current) drug therapy: Secondary | ICD-10-CM | POA: Insufficient documentation

## 2011-02-06 DIAGNOSIS — R0609 Other forms of dyspnea: Secondary | ICD-10-CM | POA: Insufficient documentation

## 2011-02-06 DIAGNOSIS — I2510 Atherosclerotic heart disease of native coronary artery without angina pectoris: Secondary | ICD-10-CM | POA: Insufficient documentation

## 2011-02-07 ENCOUNTER — Emergency Department (HOSPITAL_COMMUNITY): Payer: Medicare Other

## 2011-02-07 LAB — COMPREHENSIVE METABOLIC PANEL
AST: 13 U/L (ref 0–37)
Albumin: 3.6 g/dL (ref 3.5–5.2)
Chloride: 104 mEq/L (ref 96–112)
Creatinine, Ser: 1.99 mg/dL — ABNORMAL HIGH (ref 0.50–1.10)
Total Bilirubin: 0.2 mg/dL — ABNORMAL LOW (ref 0.3–1.2)
Total Protein: 7.6 g/dL (ref 6.0–8.3)

## 2011-02-07 LAB — URINALYSIS, ROUTINE W REFLEX MICROSCOPIC
Glucose, UA: 100 mg/dL — AB
Ketones, ur: NEGATIVE mg/dL
Leukocytes, UA: NEGATIVE
Nitrite: NEGATIVE
Specific Gravity, Urine: 1.017 (ref 1.005–1.030)
pH: 6 (ref 5.0–8.0)

## 2011-02-07 LAB — DIFFERENTIAL
Basophils Absolute: 0 10*3/uL (ref 0.0–0.1)
Eosinophils Absolute: 0.1 10*3/uL (ref 0.0–0.7)
Eosinophils Relative: 1 % (ref 0–5)
Lymphs Abs: 2.8 10*3/uL (ref 0.7–4.0)
Monocytes Absolute: 0.8 10*3/uL (ref 0.1–1.0)

## 2011-02-07 LAB — PRO B NATRIURETIC PEPTIDE: Pro B Natriuretic peptide (BNP): 803.4 pg/mL — ABNORMAL HIGH (ref 0–125)

## 2011-02-07 LAB — POCT I-STAT TROPONIN I
Troponin i, poc: 0.01 ng/mL (ref 0.00–0.08)
Troponin i, poc: 0.02 ng/mL (ref 0.00–0.08)

## 2011-02-07 LAB — CBC
MCHC: 34.7 g/dL (ref 30.0–36.0)
MCV: 89.4 fL (ref 78.0–100.0)
Platelets: 237 10*3/uL (ref 150–400)
RDW: 14.3 % (ref 11.5–15.5)
WBC: 7.7 10*3/uL (ref 4.0–10.5)

## 2011-02-07 LAB — URINE MICROSCOPIC-ADD ON

## 2011-02-10 NOTE — Discharge Summary (Signed)
Wanda [name], Wanda [name]              ACCOUNT NO.:  [id number]  MEDICAL RECORD NO.:  [id number]  LOCATION:  2021                         FACILITY:  [facility]  PHYSICIAN:  Nanetta Batty, M.D.   DATE OF BIRTH:  05-20-1945  DATE OF ADMISSION:  12/28/2010 DATE OF DISCHARGE:  01/01/2011                              DISCHARGE SUMMARY   DISCHARGE DIAGNOSES: 1. Acute on chronic diastolic heart failure, improved and euvolemic at     discharge. 2. Chest pain, negative myocardial infarction.  Does have elevated     amylase at 135 on admission, decreased during hospitalization     associated with nausea and vomiting. 3. Abnormal left ventricular function discrepancy between stress test     and last echo.  Followup echo done January 01, 2011, results     pending.  Please see dictated report. 4. Acute on chronic renal failure with increased creatinine secondary     to diuretics. 5. Possible history of paroxysmal atrial fibrillation, but none on     this admission. 6. Premature ventricular contractions bigeminy at times. 7. Type 2 insulin-dependent diabetes mellitus, controlled. 8. Hypertension accelerated with multiple medication adjustments this     admission. 9. Pulmonary hypertension with adjustments to medications for blood     pressure. 10.Asthma. 11.History of asymptomatic bradycardia. 12.Obstructive sleep apnea. 13.Pulmonary scarring with VATS procedure in January 2012 in [place].  DISCHARGE CONDITION:  Improved.  PROCEDURES:  None.  DISCHARGE MEDICATIONS:  See medication reconciliation sheet though her Avapro was increased as well as her Imdur and Protonix was increased to twice a day for week and then back to one daily.  DISCHARGE INSTRUCTIONS: 1. Increase activity slowly. 2. Low-sodium heart-healthy diabetic diet. 3. Follow up with Dr. Royann Shivers in 7-10 days, office will call with     date and time. 4. Follow up with Dr. Selena Batten as instructed.  HOSPITAL COURSE:  A 65 year old  African American female known to Dr. Royann Shivers presented to the emergency room on December 28, 2010, with complaints of dyspnea on exertion with occasional bouts of substernal chest tightness 5/10 on a 1-10 scale.  She has known coronary artery disease with PCI with bare-metal stent to the LAD.  She is admitted to rule out MI and on admission her blood pressure was elevated at 167/51. By the next morning, she had no further chest pain, but she was obviously dyspneic coming from the bathroom.  Her amylase was elevated at 135 and recheck she was down to 116.  Pro BNP initially was 688 and at discharge in the 300s.  LFTs were normal.  She did get abdominal ultrasound that showed some kidney stones, but no acute cholecystitis. There was stable echogenic kidneys consistent with chronic medical renal disease.  No hydronephrosis.  She then underwent a nuclear stress test, small-to-moderate infarct within the anterolateral wall with small amount of peri-infarction ischemia, lateral wall hypokinesia on the background of globalhypokinesia.  EF was 37% where previously her EF had been 55% to 60% on echo and 40% on cath.  Repeat 2-D echo has been ordered and we are currently awaiting results.  The patient's blood pressure continued to be elevated.  Medications were adjusted.  The patient  continued to improve.  By December 31, 2010, she was euvolemic, feeling much better, ready for discharge, no more nausea, vomiting, but awaiting echo stress test.  If this is stable, we will discharge her though we have actually adjusted medications here yesterday, therefore, she will be discharged on these medications and any other adjustments will be done as an outpatient.  Blood pressure still somewhat mildly elevated for a diabetic, it is elevated at 156/69.  LABORATORY VALUES:  Hemoglobin 11.2, hematocrit 33.5, WBC 6.4, platelets 199.  Pro time 14.5, INR 1.11, PTT 31.  Chemistry initially creatinine was 1.85,  at discharge 1.99.  Other chemistry; sodium 143, potassium 3.6, chloride 106, CO2 of 28, glucose 99, total bili is 0.2, direct bili less than 0.1, alkaline phos 93, SGOT 11, SGPT 8, total protein 6.4, albumin was 3, calcium 9.1, magnesium 2.3.  Amylase as stated 135 on admission, next day was down to 116.  Hemoglobin A1c was 6.8, lipase 43. BNP was 340 at discharge and 688 on admission.  Cardiac enzymes were negative with CKs 111-117 with MBs 2.2-2.0 and troponin-I less than 0.30.  Total cholesterol 194, triglycerides 194, HDL 49, LDL 106.  TSH was 5.902.  Chest x-ray; two-view, no acute cardiopulmonary diseases or significant interval change, stable post surgical changes in the left chest.  The patient is stable at discharge, will follow up as an outpatient if there are any significant changes on her 2-D echo, we may have this plan.     Darcella Gasman. Annie Paras, N.P.   ______________________________ Nanetta Batty, M.D.    LRI/MEDQ  D:  01/01/2011  T:  01/01/2011  Job:  [id number]  cc:   Massie Maroon, MD  Electronically Signed by Nada Boozer N.P. on 01/12/2011 01:11:17 PM Electronically Signed by Nanetta Batty M.D. on 02/10/2011 05:21:44 PM

## 2011-02-13 ENCOUNTER — Encounter (HOSPITAL_COMMUNITY): Payer: Self-pay | Admitting: Emergency Medicine

## 2011-02-13 ENCOUNTER — Observation Stay (HOSPITAL_COMMUNITY)
Admission: EM | Admit: 2011-02-13 | Discharge: 2011-02-16 | Disposition: A | Discharge status: Home / Self Care | Payer: Medicare Other | Attending: Cardiovascular Disease | Admitting: Cardiovascular Disease

## 2011-02-13 ENCOUNTER — Other Ambulatory Visit: Payer: Self-pay

## 2011-02-13 DIAGNOSIS — N183 Chronic kidney disease, stage 3 unspecified: Secondary | ICD-10-CM | POA: Insufficient documentation

## 2011-02-13 DIAGNOSIS — I129 Hypertensive chronic kidney disease with stage 1 through stage 4 chronic kidney disease, or unspecified chronic kidney disease: Secondary | ICD-10-CM | POA: Insufficient documentation

## 2011-02-13 DIAGNOSIS — E876 Hypokalemia: Secondary | ICD-10-CM | POA: Insufficient documentation

## 2011-02-13 DIAGNOSIS — R002 Palpitations: Secondary | ICD-10-CM | POA: Diagnosis present

## 2011-02-13 DIAGNOSIS — Z6830 Body mass index (BMI) 30.0-30.9, adult: Secondary | ICD-10-CM | POA: Insufficient documentation

## 2011-02-13 DIAGNOSIS — I480 Paroxysmal atrial fibrillation: Secondary | ICD-10-CM

## 2011-02-13 DIAGNOSIS — I498 Other specified cardiac arrhythmias: Secondary | ICD-10-CM | POA: Insufficient documentation

## 2011-02-13 DIAGNOSIS — I2510 Atherosclerotic heart disease of native coronary artery without angina pectoris: Secondary | ICD-10-CM | POA: Insufficient documentation

## 2011-02-13 DIAGNOSIS — I4891 Unspecified atrial fibrillation: Secondary | ICD-10-CM | POA: Insufficient documentation

## 2011-02-13 DIAGNOSIS — R0602 Shortness of breath: Secondary | ICD-10-CM | POA: Insufficient documentation

## 2011-02-13 DIAGNOSIS — E669 Obesity, unspecified: Secondary | ICD-10-CM | POA: Insufficient documentation

## 2011-02-13 DIAGNOSIS — E1159 Type 2 diabetes mellitus with other circulatory complications: Secondary | ICD-10-CM

## 2011-02-13 DIAGNOSIS — R079 Chest pain, unspecified: Principal | ICD-10-CM | POA: Insufficient documentation

## 2011-02-13 DIAGNOSIS — E119 Type 2 diabetes mellitus without complications: Secondary | ICD-10-CM | POA: Insufficient documentation

## 2011-02-13 HISTORY — DX: Other specified postprocedural states: Z98.890

## 2011-02-13 HISTORY — DX: Heart failure, unspecified: I50.9

## 2011-02-13 HISTORY — DX: Nausea with vomiting, unspecified: R11.2

## 2011-02-13 HISTORY — DX: Acute myocardial infarction, unspecified: I21.9

## 2011-02-13 HISTORY — DX: Encounter for other specified aftercare: Z51.89

## 2011-02-13 HISTORY — DX: Pneumonia, unspecified organism: J18.9

## 2011-02-13 HISTORY — DX: Gastro-esophageal reflux disease without esophagitis: K21.9

## 2011-02-13 HISTORY — DX: Cardiac murmur, unspecified: R01.1

## 2011-02-13 HISTORY — DX: Acute upper respiratory infection, unspecified: J06.9

## 2011-02-13 HISTORY — DX: Adverse effect of unspecified anesthetic, initial encounter: T41.45XA

## 2011-02-13 HISTORY — DX: Reserved for inherently not codable concepts without codable children: IMO0001

## 2011-02-13 HISTORY — DX: Other reaction to spinal and lumbar puncture: G97.1

## 2011-02-13 HISTORY — DX: Depression, unspecified: F32.A

## 2011-02-13 HISTORY — DX: Hypothyroidism, unspecified: E03.9

## 2011-02-13 HISTORY — DX: Unspecified osteoarthritis, unspecified site: M19.90

## 2011-02-13 HISTORY — DX: Major depressive disorder, single episode, unspecified: F32.9

## 2011-02-13 HISTORY — DX: Diaphragmatic hernia without obstruction or gangrene: K44.9

## 2011-02-13 HISTORY — DX: Angina pectoris, unspecified: I20.9

## 2011-02-13 NOTE — ED Notes (Signed)
Pt c/o midsternal CP with radiation to right shoulder with SOB; pt denies nausea; pt sts started last night and took ultram with relief; pt sts pain is intermittent and sts pain and worse with exertion

## 2011-02-14 ENCOUNTER — Emergency Department (HOSPITAL_COMMUNITY): Payer: Medicare Other

## 2011-02-14 ENCOUNTER — Encounter (HOSPITAL_COMMUNITY): Payer: Self-pay | Admitting: Cardiology

## 2011-02-14 ENCOUNTER — Other Ambulatory Visit: Payer: Self-pay

## 2011-02-14 DIAGNOSIS — E66811 Obesity, class 1: Secondary | ICD-10-CM

## 2011-02-14 DIAGNOSIS — I2510 Atherosclerotic heart disease of native coronary artery without angina pectoris: Secondary | ICD-10-CM | POA: Diagnosis present

## 2011-02-14 DIAGNOSIS — E119 Type 2 diabetes mellitus without complications: Secondary | ICD-10-CM

## 2011-02-14 DIAGNOSIS — D509 Iron deficiency anemia, unspecified: Secondary | ICD-10-CM

## 2011-02-14 DIAGNOSIS — N183 Chronic kidney disease, stage 3 unspecified: Secondary | ICD-10-CM

## 2011-02-14 DIAGNOSIS — E1159 Type 2 diabetes mellitus with other circulatory complications: Secondary | ICD-10-CM

## 2011-02-14 DIAGNOSIS — E669 Obesity, unspecified: Secondary | ICD-10-CM

## 2011-02-14 DIAGNOSIS — I480 Paroxysmal atrial fibrillation: Secondary | ICD-10-CM

## 2011-02-14 DIAGNOSIS — R002 Palpitations: Secondary | ICD-10-CM | POA: Diagnosis present

## 2011-02-14 HISTORY — DX: Iron deficiency anemia, unspecified: D50.9

## 2011-02-14 LAB — DIFFERENTIAL
Basophils Absolute: 0 10*3/uL (ref 0.0–0.1)
Basophils Relative: 0 % (ref 0–1)
Eosinophils Absolute: 0.1 10*3/uL (ref 0.0–0.7)
Eosinophils Relative: 2 % (ref 0–5)
Lymphocytes Relative: 39 % (ref 12–46)
Lymphs Abs: 2.9 10*3/uL (ref 0.7–4.0)
Monocytes Absolute: 0.7 10*3/uL (ref 0.1–1.0)
Monocytes Relative: 9 % (ref 3–12)
Neutro Abs: 3.8 10*3/uL (ref 1.7–7.7)
Neutrophils Relative %: 50 % (ref 43–77)

## 2011-02-14 LAB — CBC
HCT: 33.4 % — ABNORMAL LOW (ref 36.0–46.0)
Hemoglobin: 11.5 g/dL — ABNORMAL LOW (ref 12.0–15.0)
Hemoglobin: 11.2 g/dL — ABNORMAL LOW (ref 12.0–15.0)
MCH: 30 pg (ref 26.0–34.0)
MCHC: 33.4 g/dL (ref 30.0–36.0)
MCHC: 33.5 g/dL (ref 30.0–36.0)
MCV: 89.5 fL (ref 78.0–100.0)
Platelets: 242 10*3/uL (ref 150–400)
Platelets: 244 10*3/uL (ref 150–400)
RBC: 3.83 MIL/uL — ABNORMAL LOW (ref 3.87–5.11)
RBC: 3.73 MIL/uL — ABNORMAL LOW (ref 3.87–5.11)
RDW: 14.2 % (ref 11.5–15.5)
WBC: 6.3 10*3/uL (ref 4.0–10.5)

## 2011-02-14 LAB — COMPREHENSIVE METABOLIC PANEL
ALT: 7 U/L (ref 0–35)
AST: 10 U/L (ref 0–37)
Albumin: 2.9 g/dL — ABNORMAL LOW (ref 3.5–5.2)
Alkaline Phosphatase: 82 U/L (ref 39–117)
BUN: 27 mg/dL — ABNORMAL HIGH (ref 6–23)
CO2: 27 mEq/L (ref 19–32)
Calcium: 9.5 mg/dL (ref 8.4–10.5)
Chloride: 110 mEq/L (ref 96–112)
Creatinine, Ser: 1.77 mg/dL — ABNORMAL HIGH (ref 0.50–1.10)
GFR calc Af Amer: 34 mL/min — ABNORMAL LOW (ref >90)
GFR calc non Af Amer: 29 mL/min — ABNORMAL LOW (ref >90)
Glucose, Bld: 87 mg/dL (ref 70–99)
Potassium: 2.8 mEq/L — ABNORMAL LOW (ref 3.5–5.1)
Sodium: 147 mEq/L — ABNORMAL HIGH (ref 135–145)
Total Bilirubin: 0.2 mg/dL — ABNORMAL LOW (ref 0.3–1.2)
Total Protein: 6.2 g/dL (ref 6.0–8.3)

## 2011-02-14 LAB — CARDIAC PANEL(CRET KIN+CKTOT+MB+TROPI)
CK, MB: 2.5 ng/mL (ref 0.3–4.0)
Total CK: 168 U/L (ref 7–177)

## 2011-02-14 LAB — PRO B NATRIURETIC PEPTIDE: Pro B Natriuretic peptide (BNP): 880.3 pg/mL — ABNORMAL HIGH (ref 0–125)

## 2011-02-14 LAB — PROTIME-INR
INR: 1.19 (ref 0.00–1.49)
Prothrombin Time: 15.4 seconds — ABNORMAL HIGH (ref 11.6–15.2)

## 2011-02-14 LAB — POTASSIUM: Potassium: 3.7 mEq/L (ref 3.5–5.1)

## 2011-02-14 LAB — TSH: TSH: 7.282 u[IU]/mL — ABNORMAL HIGH (ref 0.350–4.500)

## 2011-02-14 LAB — HEMOGLOBIN A1C
Hgb A1c MFr Bld: 6.4 % — ABNORMAL HIGH (ref <5.7)
Mean Plasma Glucose: 137 mg/dL — ABNORMAL HIGH (ref <117)

## 2011-02-14 LAB — MAGNESIUM: Magnesium: 1.9 mg/dL (ref 1.5–2.5)

## 2011-02-14 LAB — APTT: aPTT: 32 seconds (ref 24–37)

## 2011-02-14 MED ORDER — FUROSEMIDE 80 MG PO TABS
80.0000 mg | ORAL_TABLET | Freq: Every day | ORAL | Status: DC
  Administered 2011-02-15 – 2011-02-16 (×2): 80 mg via ORAL
  Filled 2011-02-14 (×3): qty 1

## 2011-02-14 MED ORDER — BRIMONIDINE TARTRATE 0.15 % OP SOLN
1.0000 [drp] | Freq: Two times a day (BID) | OPHTHALMIC | Status: DC
  Filled 2011-02-14: qty 5

## 2011-02-14 MED ORDER — ROSUVASTATIN CALCIUM 10 MG PO TABS
10.0000 mg | ORAL_TABLET | Freq: Every day | ORAL | Status: DC
  Administered 2011-02-14 – 2011-02-16 (×3): 10 mg via ORAL
  Filled 2011-02-14 (×4): qty 1

## 2011-02-14 MED ORDER — ASPIRIN 300 MG RE SUPP: 300.0000 mg | RECTAL | Status: AC

## 2011-02-14 MED ORDER — ALPRAZOLAM 0.25 MG PO TABS: 0.2500 mg | ORAL_TABLET | Freq: Three times a day (TID) | ORAL | Status: DC | PRN

## 2011-02-14 MED ORDER — SODIUM CHLORIDE 0.9 % IJ SOLN: 3.0000 mL | INTRAMUSCULAR | Status: DC | PRN

## 2011-02-14 MED ORDER — BIMATOPROST 0.03 % OP SOLN
1.0000 [drp] | Freq: Every day | OPHTHALMIC | Status: DC
  Administered 2011-02-14 – 2011-02-15 (×2): 1 [drp] via OPHTHALMIC
  Filled 2011-02-14: qty 2.5

## 2011-02-14 MED ORDER — LEVOTHYROXINE SODIUM 75 MCG PO TABS
37.5000 ug | ORAL_TABLET | ORAL | Status: DC
  Administered 2011-02-14 – 2011-02-16 (×3): 37.5 ug via ORAL
  Filled 2011-02-14 (×6): qty 0.5

## 2011-02-14 MED ORDER — ALBUTEROL SULFATE HFA 108 (90 BASE) MCG/ACT IN AERS
2.0000 | INHALATION_SPRAY | Freq: Four times a day (QID) | RESPIRATORY_TRACT | Status: DC | PRN
  Administered 2011-02-14 – 2011-02-16 (×2): 2 via RESPIRATORY_TRACT
  Filled 2011-02-14: qty 6.7

## 2011-02-14 MED ORDER — POTASSIUM CHLORIDE CRYS ER 20 MEQ PO TBCR
20.0000 meq | EXTENDED_RELEASE_TABLET | ORAL | Status: DC
  Administered 2011-02-14 – 2011-02-15 (×2): 20 meq via ORAL
  Filled 2011-02-14 (×3): qty 1

## 2011-02-14 MED ORDER — PRAMOXINE HCL 1 % RE OINT
1.0000 "application " | TOPICAL_OINTMENT | Freq: Three times a day (TID) | RECTAL | Status: DC | PRN
  Filled 2011-02-14: qty 30

## 2011-02-14 MED ORDER — AMLODIPINE BESYLATE 10 MG PO TABS
10.0000 mg | ORAL_TABLET | Freq: Every day | ORAL | Status: DC
  Administered 2011-02-14 – 2011-02-16 (×3): 10 mg via ORAL
  Filled 2011-02-14 (×4): qty 1

## 2011-02-14 MED ORDER — HEPARIN SODIUM (PORCINE) 5000 UNIT/ML IJ SOLN
5000.0000 [IU] | Freq: Three times a day (TID) | INTRAMUSCULAR | Status: DC
  Administered 2011-02-14 – 2011-02-16 (×6): 5000 [IU] via SUBCUTANEOUS
  Filled 2011-02-14 (×5): qty 1

## 2011-02-14 MED ORDER — CARVEDILOL 6.25 MG PO TABS
6.2500 mg | ORAL_TABLET | Freq: Two times a day (BID) | ORAL | Status: DC
  Administered 2011-02-14 – 2011-02-16 (×4): 6.25 mg via ORAL
  Filled 2011-02-14 (×9): qty 1

## 2011-02-14 MED ORDER — FERROUS SULFATE 325 (65 FE) MG PO TABS
325.0000 mg | ORAL_TABLET | Freq: Two times a day (BID) | ORAL | Status: DC
  Administered 2011-02-14 – 2011-02-16 (×5): 325 mg via ORAL
  Filled 2011-02-14 (×8): qty 1

## 2011-02-14 MED ORDER — ALUM & MAG HYDROXIDE-SIMETH 200-200-20 MG/5ML PO SUSP: 15.0000 mL | ORAL | Status: DC | PRN

## 2011-02-14 MED ORDER — FUROSEMIDE 40 MG PO TABS
40.0000 mg | ORAL_TABLET | Freq: Every day | ORAL | Status: DC
  Administered 2011-02-14 – 2011-02-15 (×2): 40 mg via ORAL
  Filled 2011-02-14 (×3): qty 1

## 2011-02-14 MED ORDER — TRAMADOL HCL 50 MG PO TABS
50.0000 mg | ORAL_TABLET | Freq: Four times a day (QID) | ORAL | Status: DC | PRN
  Administered 2011-02-16: 50 mg via ORAL
  Filled 2011-02-14: qty 1

## 2011-02-14 MED ORDER — PANTOPRAZOLE SODIUM 40 MG PO TBEC
40.0000 mg | DELAYED_RELEASE_TABLET | ORAL | Status: DC
Start: 2011-02-14 — End: 2011-02-16
  Administered 2011-02-14 – 2011-02-16 (×3): 40 mg via ORAL
  Filled 2011-02-14 (×3): qty 1

## 2011-02-14 MED ORDER — POTASSIUM CHLORIDE CRYS ER 20 MEQ PO TBCR
20.0000 meq | EXTENDED_RELEASE_TABLET | Freq: Once | ORAL | Status: AC
  Administered 2011-02-14: 20 meq via ORAL
  Filled 2011-02-14: qty 1

## 2011-02-14 MED ORDER — ASPIRIN 81 MG PO CHEW
324.0000 mg | CHEWABLE_TABLET | ORAL | Status: AC
  Filled 2011-02-14: qty 4

## 2011-02-14 MED ORDER — AMIODARONE HCL 200 MG PO TABS
100.0000 mg | ORAL_TABLET | ORAL | Status: DC
  Administered 2011-02-14 – 2011-02-16 (×3): 100 mg via ORAL
  Filled 2011-02-14: qty 1
  Filled 2011-02-14 (×2): qty 0.5
  Filled 2011-02-14 (×2): qty 1

## 2011-02-14 MED ORDER — INSULIN GLARGINE 100 UNIT/ML ~~LOC~~ SOLN
25.0000 [IU] | Freq: Every day | SUBCUTANEOUS | Status: DC
  Administered 2011-02-14 – 2011-02-16 (×3): 25 [IU] via SUBCUTANEOUS
  Filled 2011-02-14 (×6): qty 3

## 2011-02-14 MED ORDER — FLUTICASONE-SALMETEROL 250-50 MCG/DOSE IN AEPB
1.0000 | INHALATION_SPRAY | Freq: Two times a day (BID) | RESPIRATORY_TRACT | Status: DC
  Administered 2011-02-15 – 2011-02-16 (×3): 1 via RESPIRATORY_TRACT
  Filled 2011-02-14: qty 14

## 2011-02-14 MED ORDER — HYDRALAZINE HCL 50 MG PO TABS
50.0000 mg | ORAL_TABLET | Freq: Three times a day (TID) | ORAL | Status: DC
  Administered 2011-02-14 – 2011-02-16 (×7): 50 mg via ORAL
  Filled 2011-02-14 (×12): qty 1

## 2011-02-14 MED ORDER — PARICALCITOL 1 MCG PO CAPS
1.0000 ug | ORAL_CAPSULE | Freq: Every day | ORAL | Status: DC
  Administered 2011-02-14 – 2011-02-16 (×3): 1 ug via ORAL
  Filled 2011-02-14 (×6): qty 1

## 2011-02-14 MED ORDER — ZOLPIDEM TARTRATE 5 MG PO TABS
5.0000 mg | ORAL_TABLET | Freq: Every evening | ORAL | Status: DC | PRN
  Administered 2011-02-14: 5 mg via ORAL
  Filled 2011-02-14: qty 1

## 2011-02-14 MED ORDER — NITROGLYCERIN 0.4 MG SL SUBL: 0.4000 mg | SUBLINGUAL_TABLET | SUBLINGUAL | Status: DC | PRN

## 2011-02-14 MED ORDER — CLOPIDOGREL BISULFATE 75 MG PO TABS
75.0000 mg | ORAL_TABLET | ORAL | Status: DC
  Administered 2011-02-14 – 2011-02-16 (×3): 75 mg via ORAL
  Filled 2011-02-14 (×3): qty 1

## 2011-02-14 MED ORDER — MAGNESIUM HYDROXIDE 400 MG/5ML PO SUSP: 30.0000 mL | Freq: Every day | ORAL | Status: DC | PRN

## 2011-02-14 MED ORDER — ASPIRIN EC 81 MG PO TBEC
81.0000 mg | DELAYED_RELEASE_TABLET | Freq: Every day | ORAL | Status: DC
  Administered 2011-02-15 – 2011-02-16 (×2): 81 mg via ORAL
  Filled 2011-02-14 (×3): qty 1

## 2011-02-14 MED ORDER — INSULIN ASPART 100 UNIT/ML ~~LOC~~ SOLN
0.0000 [IU] | Freq: Every day | SUBCUTANEOUS | Status: DC
  Filled 2011-02-14: qty 3

## 2011-02-14 MED ORDER — GUAIFENESIN-DM 100-10 MG/5ML PO SYRP: 15.0000 mL | ORAL_SOLUTION | ORAL | Status: DC | PRN

## 2011-02-14 MED ORDER — ONDANSETRON HCL 4 MG/2ML IJ SOLN: 4.0000 mg | Freq: Four times a day (QID) | INTRAMUSCULAR | Status: DC | PRN

## 2011-02-14 MED ORDER — ACETAMINOPHEN 325 MG PO TABS
650.0000 mg | ORAL_TABLET | ORAL | Status: DC | PRN
  Administered 2011-02-15 – 2011-02-16 (×2): 650 mg via ORAL
  Filled 2011-02-14 (×2): qty 2

## 2011-02-14 MED ORDER — ISOSORBIDE MONONITRATE ER 30 MG PO TB24
30.0000 mg | ORAL_TABLET | ORAL | Status: DC
  Administered 2011-02-14 – 2011-02-16 (×3): 30 mg via ORAL
  Filled 2011-02-14 (×5): qty 1

## 2011-02-14 MED ORDER — BRIMONIDINE TARTRATE 0.2 % OP SOLN
1.0000 [drp] | Freq: Two times a day (BID) | OPHTHALMIC | Status: DC
  Administered 2011-02-14 – 2011-02-16 (×5): 1 [drp] via OPHTHALMIC
  Filled 2011-02-14: qty 5

## 2011-02-14 MED ORDER — BIMATOPROST 0.01 % OP SOLN
1.0000 [drp] | Freq: Every day | OPHTHALMIC | Status: DC
  Filled 2011-02-14: qty 5

## 2011-02-14 MED ORDER — INSULIN ASPART 100 UNIT/ML ~~LOC~~ SOLN
0.0000 [IU] | Freq: Three times a day (TID) | SUBCUTANEOUS | Status: DC
  Administered 2011-02-14 – 2011-02-15 (×3): 3 [IU] via SUBCUTANEOUS
  Administered 2011-02-16: 5 [IU] via SUBCUTANEOUS
  Filled 2011-02-14 (×2): qty 3

## 2011-02-14 MED ORDER — FUROSEMIDE 20 MG PO TABS
40.0000 mg | ORAL_TABLET | Freq: Two times a day (BID) | ORAL | Status: DC
  Filled 2011-02-14: qty 4

## 2011-02-14 MED ORDER — SIMVASTATIN 40 MG PO TABS
40.0000 mg | ORAL_TABLET | ORAL | Status: DC
  Filled 2011-02-14 (×3): qty 1

## 2011-02-14 NOTE — ED Notes (Signed)
Assumed care of pt.  Pt reports having CP that started yesterday in her chest and radiated to her (L) chest.  States that she did get SOB and have some wheezing, denies N/V/Diaphoresis.  States that she went to sleep and when she woke up she was again hurting.  Pt tender on palpation of her midchest, states that pain increases with movement.  Skin warm, dry and intact.  Neuro intact.

## 2011-02-14 NOTE — ED Notes (Signed)
Assume care of patient

## 2011-02-14 NOTE — H&P (Signed)
Wanda [name] is an 65 y.o. female.   Chief Complaint: Palpitations and chest pain HPI: 65 y/o female with a hx of CAD, PAF, SSS, CRI, DM, and HTN. She had a Dx BMS in [place] in 8/09. In 1/12 she had an ant MI treated with LAD BMS that jailed the Dx. In 2/12 she had Lt VATS in [place] secondary to a nodule. She then moved to [place]. Since then she has had several adm for chest pain and diastolic CHF. She was adm in 8/12 and recently seen in the ER 01/04/11, 01/11/11, 02/07/11. She was cathed in 6/12 showing patent LAD stent. She has a history of PAF and has been maintained on low dose Amiodarone. She has frequent PVCs.  She has been intolerant to beta blockers in the past. She came to the ER early this am with complaints of palpatations. Prior to these she c/o of sscp last pm. In the ER she is in NSR with bigeminy and PACs. She is currently pain free.   Past Medical History  Diagnosis Date  . PAF/SSS, not felt to be a coumadin candidate secondary to compliance   . History of angina, multiple adm, last cath 6/12   . HTN (hypertension), labile   . DM (diabetes mellitus), type 2 NIDDM   . CAD (coronary artery disease), DX BMS 8/09, LAD BMS 1/12   . Bigeminy   . Obesity   . Renal failure, stage 3 CRI Hx Glaucoma     Past Surgical History  Procedure Date  . Ankle surgery   . Cardiac stents. Lt VATS 1/12 in [facility]     Family History  Problem Relation Age of Onset  . Cancer         Diabetes   Social History: 4 children, quit smoking 37yrs ago.  Allergies:  Allergies  Allergen Reactions  . IV Contrast   . Erythromycin Beta Blocker intolerant secondary to bradycardia     No current facility-administered medications on file as of 02/13/2011.   Medications Prior to Admission  Medication Sig Dispense Refill  . amLODipine (NORVASC) 10 MG tablet Take 10 mg by mouth daily.        Marland Kitchen aspirin 81 MG tablet Take 81 mg by mouth daily.        . Bimatoprost (LUMIGAN) 0.01 % SOLN Apply to eye at bedtime. One  drop in each eye at bedtime      . carvedilol (COREG) 3.125 MG tablet Take 3.125 mg by mouth 2 (two) times daily with a meal.       . clopidogrel (PLAVIX) 75 MG tablet Take 75 mg by mouth every morning.       . furosemide (LASIX) 80 MG tablet Take 40-80 mg by mouth 2 (two) times daily. 1 take in the am, 0.5 half tab in the pm      . hydrALAZINE (APRESOLINE) 50 MG tablet 50 mg 3 (three) times daily. 2 tabs (100mg ) in the am, 1.5 tabs (75mg ) in the evening about 5pm, 1.5 tabs (75mg ) at 12am      . insulin glargine (LANTUS) 100 UNIT/ML injection Inject 25 Units into the skin daily after breakfast. 20 units daily      . isosorbide mononitrate (IMDUR) 60 MG 24 hr tablet Take 30 mg by mouth every morning.       Marland Kitchen omeprazole (PRILOSEC) 20 MG capsule Take 20 mg by mouth every morning.       . potassium chloride SA (K-DUR,KLOR-CON) 20 MEQ tablet  Take 20 mEq by mouth every morning.       . simvastatin (ZOCOR) 40 MG tablet Take 40 mg by mouth every morning.       Marland Kitchen TEMAZEPAM PO Take 30 mg by mouth at bedtime as needed. For sleep        Results for orders placed during the hospital encounter of 02/13/11 (from the past 48 hour(s))  CBC     Status: Abnormal   Collection Time   02/14/11  5:01 AM      Component Value Range Comment   WBC 7.5  4.0 - 10.5 (K/uL)    RBC 3.83 (*) 3.87 - 5.11 (MIL/uL)    Hemoglobin 11.5 (*) 12.0 - 15.0 (g/dL)    HCT 47.8 (*) 29.5 - 46.0 (%)    MCV 89.8  78.0 - 100.0 (fL)    MCH 30.0  26.0 - 34.0 (pg)    MCHC 33.4  30.0 - 36.0 (g/dL)    RDW 62.1  30.8 - 65.7 (%)    Platelets 242  150 - 400 (K/uL)   DIFFERENTIAL     Status: Normal   Collection Time   02/14/11  5:01 AM      Component Value Range Comment   Neutrophils Relative 50  43 - 77 (%)    Neutro Abs 3.8  1.7 - 7.7 (K/uL)    Lymphocytes Relative 39  12 - 46 (%)    Lymphs Abs 2.9  0.7 - 4.0 (K/uL)    Monocytes Relative 9  3 - 12 (%)    Monocytes Absolute 0.7  0.1 - 1.0 (K/uL)    Eosinophils Relative 2  0 - 5 (%)     Eosinophils Absolute 0.1  0.0 - 0.7 (K/uL)    Basophils Relative 0  0 - 1 (%)    Basophils Absolute 0.0  0.0 - 0.1 (K/uL)   PRO B NATRIURETIC PEPTIDE     Status: Abnormal   Collection Time   02/14/11  5:02 AM      Component Value Range Comment   BNP, POC 880.3 (*) 0 - 125 (pg/mL)   CARDIAC PANEL(CRET KIN+CKTOT+MB+TROPI)     Status: Normal   Collection Time   02/14/11  5:02 AM      Component Value Range Comment   Total CK 168  7 - 177 (U/L)    CK, MB 2.5  0.3 - 4.0 (ng/mL)    Troponin I <0.30  <0.30 (ng/mL)    Relative Index 1.5  0.0 - 2.5     Dg Chest 2 View  02/14/2011  *RADIOLOGY REPORT*  Clinical Data: Chest pain, palpitations.  CHEST - 2 VIEW  Comparison: 02/07/2011  Findings: Left sided volume loss, in keeping with postsurgical changes.  Surgical clips project over the left hilum. No focal areas of consolidation.  No pleural effusion or pneumothorax.  The left hemidiaphragm is elevated. Heart size upper normal limits to mildly enlarged.  Aortic arch atherosclerotic calcification. Multilevel degenerative changes without acute osseous abnormality identified.  IMPRESSION: Stable postoperative changes involving the left lung.  No definite acute process identified.  Original Report Authenticated By: Waneta Martins, M.D.    ROS unremarkable except where noted above. She has never had a sleep study. Last 2D 01/01/11 showed good   Blood pressure 209/63, pulse 60, temperature 98.6 F (37 C), temperature source Oral, resp. rate 18, SpO2 96.00%.  Gen Obese AA female, NAD HEENT Normocephalic, poor dention Neck no JVD, no bruit Chest Clear to  a nad p CV reg/irreg with soft syt murmur lsb Abd obese, non tender Ext trace edem bilat Neuro intact Skin warm and dry  EKG NSR with bigeminy, pacs   Assessment/Plan Palpitations with Ventricular Bigeminy  Coronary artery disease coronary artery disease, cath 6/12 showed patent LAD BMS Hypertension with suboptimal control Stage 3 CRI Type 2  NIDDM with proteinuria PAF with SSS, not felt to be a coumadin candidate secondary to compliance Chronic diastolic CHF Obesity Glaucoma Dyslipidemia   Plan  Admit to telemetry, increase beta blocker and observe rhythm. Replace potasium (3.5)   KILROY,LUKE K PA-C 02/14/2011, 8:41 AM  Patient seen and examined.  Agree with assessment and plan. Quanda Pavlicek A 02/14/2011

## 2011-02-14 NOTE — ED Notes (Addendum)
Chewable ASA 324 given, unable to chart due to software error, EPIC team notified

## 2011-02-14 NOTE — ED Notes (Signed)
Floor unable to take report.

## 2011-02-14 NOTE — ED Provider Notes (Signed)
History     CSN: [id number] Arrival date & time: 02/13/2011  4:34 PM   First MD Initiated Contact with Patient 02/14/11 [phone]      Chief Complaint  Patient presents with  . Chest Pain    (Consider location/radiation/quality/duration/timing/severity/associated sxs/prior treatment) Patient is a 65 y.o. female presenting with chest pain. The history is provided by the patient.  Chest Pain The chest pain is worsening. The pain is associated with exertion. The severity of the pain is mild. Primary symptoms include shortness of breath and palpitations. Pertinent negatives for primary symptoms include no abdominal pain, no nausea and no vomiting.  The palpitations also occurred with shortness of breath.   Pertinent negatives for associated symptoms include no numbness and no weakness.    patient has had dull chest pain going from her sternum to her right chest. It is worse with exertion. She states it sort of feels like her previous heart problems, but less severe. She said she took Ultram and got better. She states that she started walking now come back. Mild dyspnea with exertion. No cough. She states it can be more severe when her bigeminy happens. She's had 2 stents in February. She sees [place] part vascular. She states that her lower legs are swollen, she states she needs her fluid pills.  Past Medical History  Diagnosis Date  . Palpitations   . History of angina   . HTN (hypertension)   . DM (diabetes mellitus)   . CAD (coronary artery disease)   . Bigeminy   . Obesity   . Renal failure     Past Surgical History  Procedure Date  . Ankle surgery   . Cardiac stents.     Family History  Problem Relation Age of Onset  . Cancer      History  Substance Use Topics  . Smoking status: Not on file  . Smokeless tobacco: Not on file  . Alcohol Use: No    OB History    Grav Para Term Preterm Abortions TAB SAB Ect Mult Living                  Review of Systems    Constitutional: Negative for activity change and appetite change.  HENT: Negative for neck stiffness.   Eyes: Negative for pain.  Respiratory: Positive for shortness of breath. Negative for chest tightness.   Cardiovascular: Positive for chest pain, palpitations and leg swelling.  Gastrointestinal: Negative for nausea, vomiting, abdominal pain, diarrhea and abdominal distention.  Genitourinary: Negative for flank pain.  Musculoskeletal: Negative for back pain.  Skin: Negative for rash.  Neurological: Negative for weakness, numbness and headaches.  Psychiatric/Behavioral: Negative for behavioral problems.    Allergies  Blue dyes (parenteral) and Erythromycin  Home Medications   Current Outpatient Rx  Name Route Sig Dispense Refill  . ALBUTEROL SULFATE HFA 108 (90 BASE) MCG/ACT IN AERS Inhalation Inhale 2 puffs into the lungs every 6 (six) hours as needed. For shortness of breath/wheezing     . AMIODARONE HCL 200 MG PO TABS Oral Take 100 mg by mouth every morning.      Marland Kitchen AMLODIPINE BESYLATE 10 MG PO TABS Oral Take 10 mg by mouth daily.      . ASPIRIN 81 MG PO TABS Oral Take 81 mg by mouth daily.      Marland Kitchen BIMATOPROST 0.01 % OP SOLN Ophthalmic Apply to eye at bedtime. One drop in each eye at bedtime    . BRIMONIDINE TARTRATE 0.15 %  OP SOLN Both Eyes Place 1 drop into both eyes 2 (two) times daily.      Marland Kitchen CARVEDILOL 3.125 MG PO TABS Oral Take 3.125 mg by mouth 2 (two) times daily with a meal.     . CLOPIDOGREL BISULFATE 75 MG PO TABS Oral Take 75 mg by mouth every morning.     Marland Kitchen FERROUS SULFATE 325 (65 FE) MG PO TABS Oral Take 325 mg by mouth 2 (two) times daily.      Marland Kitchen FLUTICASONE-SALMETEROL 250-50 MCG/DOSE IN AEPB Inhalation Inhale 1 puff into the lungs every 12 (twelve) hours.      . FUROSEMIDE 80 MG PO TABS Oral Take 40-80 mg by mouth 2 (two) times daily. 1 take in the am, 0.5 half tab in the pm    . HYDRALAZINE HCL 50 MG PO TABS  50 mg 3 (three) times daily. 2 tabs (100mg ) in the am,  1.5 tabs (75mg ) in the evening about 5pm, 1.5 tabs (75mg ) at 12am    . INSULIN GLARGINE 100 UNIT/ML [place] SOLN Subcutaneous Inject 25 Units into the skin daily after breakfast. 20 units daily    . INSULIN LISPRO (HUMAN) 100 UNIT/ML [place] SOLN Subcutaneous Inject 4-5 Units into the skin 3 (three) times daily between meals as needed. Pt is on a sliding scale     . ISOSORBIDE MONONITRATE ER 60 MG PO TB24 Oral Take 30 mg by mouth every morning.     Marland Kitchen LEVOTHYROXINE SODIUM 75 MCG PO TABS Oral Take 37.5 mcg by mouth every morning.      Marland Kitchen LORAZEPAM 0.5 MG PO TABS Oral Take 0.5 mg by mouth every 8 (eight) hours as needed. For anxiety/sleep     . NITROGLYCERIN 0.4 MG SL SUBL Sublingual Place 0.4 mg under the tongue every 5 (five) minutes as needed. For chest pain     . OMEPRAZOLE 20 MG PO CPDR Oral Take 20 mg by mouth every morning.     Marland Kitchen PANTOPRAZOLE SODIUM 40 MG PO TBEC Oral Take 40 mg by mouth every morning.      Marland Kitchen PARICALCITOL 1 MCG PO CAPS Oral Take 1 mcg by mouth daily.      Marland Kitchen POTASSIUM CHLORIDE CRYS CR 20 MEQ PO TBCR Oral Take 20 mEq by mouth every morning.     Marland Kitchen ROSUVASTATIN CALCIUM 10 MG PO TABS Oral Take 10 mg by mouth daily.      Marland Kitchen SIMVASTATIN 40 MG PO TABS Oral Take 40 mg by mouth every morning.     Marland Kitchen TEMAZEPAM PO Oral Take 30 mg by mouth at bedtime as needed. For sleep    . TRAMADOL HCL 50 MG PO TABS Oral Take 50 mg by mouth every 6 (six) hours as needed. Maximum dose= 8 tablets per day, for pain       BP 129/73  Pulse 62  Temp(Src) 98.6 F (37 C) (Oral)  Resp 14  SpO2 100%  Physical Exam  Nursing note and vitals reviewed. Constitutional: She is oriented to person, place, and time. She appears well-developed and well-nourished.  HENT:  Head: Normocephalic and atraumatic.  Eyes: EOM are normal. Pupils are equal, round, and reactive to light.  Neck: Normal range of motion. Neck supple.  Cardiovascular: Normal rate, regular rhythm and normal heart sounds.   No murmur  heard. Pulmonary/Chest: Effort normal and breath sounds normal. She has no wheezes. She has no rales.  Abdominal: Soft. Bowel sounds are normal. She exhibits no distension. There is no tenderness.  There is no rebound and no guarding.  Musculoskeletal: Normal range of motion.       Bilateral lower extremity pitting edema.  Neurological: She is alert and oriented to person, place, and time. No cranial nerve deficit.  Skin: Skin is warm and dry.  Psychiatric: She has a normal mood and affect. Her speech is normal.    ED Course  Procedures (including critical care time)   Labs Reviewed  CBC  DIFFERENTIAL  I-STAT, CHEM 8  PRO B NATRIURETIC PEPTIDE  CARDIAC PANEL(CRET KIN+CKTOT+MB+TROPI)   No results found.   No diagnosis found.   Date: 02/14/2011  Rate: 76  Rhythm: ventricular bigeminy.  QRS Axis: normal  Intervals: normal  ST/T Wave abnormalities: normal  Conduction Disutrbances:none  Narrative Interpretation:   Old EKG Reviewed: unchanged    MDM   Chest pain. Patient's cardiac history. Previous stents. She has ventricular bigeminy. She is mostly pain-free now. I discussed with Dr. Gery Pray. He'll see patient in ER.       Juliet Rude. Rubin Payor, MD 02/14/11 [phone]

## 2011-02-14 NOTE — ED Notes (Signed)
Pt ambulated to bathroom without difficulty.

## 2011-02-15 LAB — BASIC METABOLIC PANEL
BUN: 32 mg/dL — ABNORMAL HIGH (ref 6–23)
CO2: 25 mEq/L (ref 19–32)
Calcium: 9.3 mg/dL (ref 8.4–10.5)
Chloride: 110 mEq/L (ref 96–112)
Creatinine, Ser: 1.87 mg/dL — ABNORMAL HIGH (ref 0.50–1.10)
GFR calc Af Amer: 31 mL/min — ABNORMAL LOW (ref >90)
GFR calc non Af Amer: 27 mL/min — ABNORMAL LOW (ref >90)
Glucose, Bld: 101 mg/dL — ABNORMAL HIGH (ref 70–99)
Potassium: 3.4 mEq/L — ABNORMAL LOW (ref 3.5–5.1)
Sodium: 143 mEq/L (ref 135–145)

## 2011-02-15 LAB — GLUCOSE, CAPILLARY: Glucose-Capillary: 189 mg/dL — ABNORMAL HIGH (ref 70–99)

## 2011-02-15 LAB — MRSA PCR SCREENING: MRSA by PCR: NEGATIVE

## 2011-02-15 MED ORDER — POTASSIUM CHLORIDE CRYS ER 20 MEQ PO TBCR
20.0000 meq | EXTENDED_RELEASE_TABLET | Freq: Once | ORAL | Status: AC
  Administered 2011-02-15: 20 meq via ORAL
  Filled 2011-02-15: qty 1

## 2011-02-15 MED ORDER — POTASSIUM CHLORIDE CRYS ER 20 MEQ PO TBCR
20.0000 meq | EXTENDED_RELEASE_TABLET | Freq: Two times a day (BID) | ORAL | Status: DC
  Administered 2011-02-15 – 2011-02-16 (×2): 20 meq via ORAL
  Filled 2011-02-15 (×4): qty 1

## 2011-02-15 MED ORDER — CARVEDILOL 3.125 MG PO TABS: 6.2500 mg | ORAL_TABLET | Freq: Two times a day (BID) | ORAL | Status: DC

## 2011-02-15 MED ORDER — POTASSIUM CHLORIDE CRYS ER 20 MEQ PO TBCR: 20.0000 meq | EXTENDED_RELEASE_TABLET | Freq: Two times a day (BID) | ORAL | Status: DC

## 2011-02-15 NOTE — Discharge Summary (Signed)
Physician Discharge Summary  Patient ID: BAYLOR TEEGARDEN MRN: [id number] DOB/AGE: 65-Jun-1947 65 y.o.  Admit date: 02/13/2011 Discharge date: 02/15/2011  Admission Diagnoses:  Discharge Diagnoses:  Active Problems:  Bigeminy  Hypokaylemia  Palpitations  CAD (coronary atherosclerotic disease)  Atrial fibrillation  Hypertension associated with diabetes  Diabetes mellitus  Chronic renal disease, stage III  Obesity, Class I, BMI 30-34.9   Discharged Condition: stable  Hospital Course: Wanda [name] is an 65 y.o. female.  Chief Complaint: Palpitations and chest pain  HPI: 65 y/o female with a hx of CAD, PAF, SSS, CRI, DM, and HTN. She had a Dx BMS in [place] in 8/09. In 1/12 she had an ant MI treated with LAD BMS that jailed the Dx. In 2/12 she had Lt VATS in [place] secondary to a nodule. She then moved to [place]. Since then she has had several adm for chest pain and diastolic CHF. She was adm in 8/12 and recently seen in the ER 01/04/11, 01/11/11, 02/07/11. She was cathed in 6/12 showing patent LAD stent. She has a history of PAF and has been maintained on low dose Amiodarone. She has frequent PVCs. She has been intolerant to beta blockers in the past. She came to the ER early this am with complaints of palpatations. Prior to these she c/o of sscp. In the ER she was in NSR with bigeminy and PACs. She was noted to have a low potassium of less than 3 and this was corrected.  She was adm to telemetry and her beta blocker was increased. She tolorated this well and we feel she can be d/c 02/16/11.  Consults: none      Discharge Exam: Blood pressure 166/72, pulse 61, temperature 98.6 F (37 C), temperature source Oral, resp. rate 18, weight 95.8 kg (211 lb 3.2 oz), SpO2 98.00%.   Disposition: Home or Self Care   Current Discharge Medication List    CONTINUE these medications which have CHANGED   Details  carvedilol (COREG) 3.125 MG tablet Take 2 tablets (6.25 mg total) by mouth 2 (two) times  daily with a meal. Qty: 60 tablet, Refills: 5    potassium chloride SA (K-DUR,KLOR-CON) 20 MEQ tablet Take 1 tablet (20 mEq total) by mouth 2 (two) times daily. Qty: 60 tablet, Refills: 5      CONTINUE these medications which have NOT CHANGED   Details  albuterol (PROVENTIL HFA;VENTOLIN HFA) 108 (90 BASE) MCG/ACT inhaler Inhale 2 puffs into the lungs every 6 (six) hours as needed. For shortness of breath/wheezing     amiodarone (PACERONE) 200 MG tablet Take 100 mg by mouth every morning.      amLODipine (NORVASC) 10 MG tablet Take 10 mg by mouth daily.      aspirin 81 MG tablet Take 81 mg by mouth daily.      Bimatoprost (LUMIGAN) 0.01 % SOLN Apply to eye at bedtime. One drop in each eye at bedtime    brimonidine (ALPHAGAN) 0.15 % ophthalmic solution Place 1 drop into both eyes 2 (two) times daily.      clopidogrel (PLAVIX) 75 MG tablet Take 75 mg by mouth every morning.     ferrous sulfate 325 (65 FE) MG tablet Take 325 mg by mouth 2 (two) times daily.      Fluticasone-Salmeterol (ADVAIR) 250-50 MCG/DOSE AEPB Inhale 1 puff into the lungs every 12 (twelve) hours.      furosemide (LASIX) 80 MG tablet Take 40-80 mg by mouth 2 (two) times daily. 1 take  in the am, 0.5 half tab in the pm    hydrALAZINE (APRESOLINE) 50 MG tablet 50 mg 3 (three) times daily. 2 tabs (100mg ) in the am, 1.5 tabs (75mg ) in the evening about 5pm, 1.5 tabs (75mg ) at 12am    insulin glargine (LANTUS) 100 UNIT/ML injection Inject 25 Units into the skin daily after breakfast. 20 units daily    insulin lispro (HUMALOG) 100 UNIT/ML injection Inject 4-5 Units into the skin 3 (three) times daily between meals as needed. Pt is on a sliding scale     isosorbide mononitrate (IMDUR) 60 MG 24 hr tablet Take 30 mg by mouth every morning.     levothyroxine (SYNTHROID, LEVOTHROID) 75 MCG tablet Take 37.5 mcg by mouth every morning.      LORazepam (ATIVAN) 0.5 MG tablet Take 0.5 mg by mouth every 8 (eight) hours as needed.  For anxiety/sleep     nitroGLYCERIN (NITROSTAT) 0.4 MG SL tablet Place 0.4 mg under the tongue every 5 (five) minutes as needed. For chest pain     omeprazole (PRILOSEC) 20 MG capsule Take 20 mg by mouth every morning.     paricalcitol (ZEMPLAR) 1 MCG capsule Take 1 mcg by mouth daily.      rosuvastatin (CRESTOR) 10 MG tablet Take 10 mg by mouth daily.      TEMAZEPAM PO Take 30 mg by mouth at bedtime as needed. For sleep    traMADol (ULTRAM) 50 MG tablet Take 50 mg by mouth every 6 (six) hours as needed. Maximum dose= 8 tablets per day, for pain       STOP taking these medications     pantoprazole (PROTONIX) 40 MG tablet      simvastatin (ZOCOR) 40 MG tablet        Follow-up Information    Follow up with CROITORU,MIHAI. (office will call)    Contact information:   [address]  [place] [address] [phone]          Signed: Abelino Derrick 02/15/2011, 7:07 PM  Chrystie Nose, MD Attending Cardiologist [facility]

## 2011-02-15 NOTE — Progress Notes (Signed)
  Subjective:  Up in room, no chest pain. No more palpitations.  Objective:  Vital Signs in the last 24 hours: Temp:  [97.7 F (36.5 C)-99.1 F (37.3 C)] 97.8 F (36.6 C) (11/07 0640) Pulse Rate:  [60-72] 62  (11/07 0817) Resp:  [18-23] 18  (11/07 0817) BP: (135-178)/(47-91) 152/79 mmHg (11/07 0802) SpO2:  [97 %-100 %] 97 % (11/07 0817) Weight:  [95.8 kg (211 lb 3.2 oz)] 211 lb 3.2 oz (95.8 kg) (11/07 0640)  Intake/Output from previous day: 11/06 0701 - 11/07 0700 In: 360 [P.O.:360] Out: -   Physical Exam: Heart: regular rate and rhythm, S1, S2 normal, no murmur, click, rub or gallop Chest: Clear   Rate: 52-65  Rhythm: sinus bradycardia, PVCs resolved. No further Bigeminy.  Lab Results:  [facility] 02/14/11 0939 02/14/11 0501  WBC 6.3 7.5  HGB 11.2* 11.5*  PLT 244 242    Basename 02/15/11 0534 02/14/11 1633 02/14/11 0939  NA 143 -- 147*  K 3.4* 3.7 --  CL 110 -- 110  CO2 25 -- 27  GLUCOSE 101* -- 87  BUN 32* -- 27*  CREATININE 1.87* -- 1.77*    Basename 02/14/11 0502  TROPONINI <0.30   Hepatic Function Panel  Basename 02/14/11 0939  PROT 6.2  ALBUMIN 2.9*  AST 10  ALT 7  ALKPHOS 82  BILITOT 0.2*  BILIDIR --  IBILI --   No results found for this basename: CHOL in the last 72 hours No results found for this basename: PROTIME in the last 72 hours  Imaging: Dg Chest 2 View  02/14/2011  *RADIOLOGY REPORT*  Clinical Data: Chest pain, palpitations.  CHEST - 2 VIEW  Comparison: 02/07/2011  Findings: Left sided volume loss, in keeping with postsurgical changes.  Surgical clips project over the left hilum. No focal areas of consolidation.  No pleural effusion or pneumothorax.  The left hemidiaphragm is elevated. Heart size upper normal limits to mildly enlarged.  Aortic arch atherosclerotic calcification. Multilevel degenerative changes without acute osseous abnormality identified.  IMPRESSION: Stable postoperative changes involving the left lung.  No definite  acute process identified.  Original Report Authenticated By: Waneta Martins, M.D.    Cardiac Studies:    Assessment/Plan  Palpitations with Ventricular Bigeminy, in setting of hypokaylemia (k 2.8 on adm) Hypokaylemia Coronary artery disease coronary artery disease, cath 6/12 showed patent LAD BMS  Hypertension with suboptimal control  Stage 3 CRI  Type 2 NIDDM with proteinuria  PAF with SSS, not felt to be a coumadin candidate secondary to compliance  Chronic diastolic CHF  Obesity  Glaucoma  Dyslipidemia   Plan Keep K 4.0, beta blocker has been increased, continue to monitor for bradycardia which has limited beta blocker use in the past. Increase daily K to bid. Recheck TSH (>7) and check free T4, f/u BMP in am.   Corine Shelter K PA-C 02/15/2011, 9:22 AM  I have seen and examined the patient along with Corine Shelter, PA.  I have reviewed the chart, notes and new data.  I agree with Luke's note.  Key new complaints: None Key examination changes: None Key new findings / data: K+ improved.  PLAN: Monitor overnite for bradycardia given increased BB dose. RE-check TSH Increase daily potassium supplementation.  Anticipate d/c in Am if stable.  HARDING,DAVID W 02/15/2011, 5:07 PM

## 2011-02-16 ENCOUNTER — Other Ambulatory Visit: Payer: Self-pay | Admitting: Cardiology

## 2011-02-16 DIAGNOSIS — Z23 Encounter for immunization: Secondary | ICD-10-CM

## 2011-02-16 LAB — BASIC METABOLIC PANEL
BUN: 33 mg/dL — ABNORMAL HIGH (ref 6–23)
CO2: 23 mEq/L (ref 19–32)
Calcium: 9.9 mg/dL (ref 8.4–10.5)
Chloride: 108 mEq/L (ref 96–112)
Creatinine, Ser: 2.02 mg/dL — ABNORMAL HIGH (ref 0.50–1.10)
GFR calc Af Amer: 29 mL/min — ABNORMAL LOW (ref >90)
GFR calc non Af Amer: 25 mL/min — ABNORMAL LOW (ref >90)
Glucose, Bld: 108 mg/dL — ABNORMAL HIGH (ref 70–99)
Potassium: 3.8 mEq/L (ref 3.5–5.1)
Sodium: 144 mEq/L (ref 135–145)

## 2011-02-16 LAB — TSH: TSH: 8.658 u[IU]/mL — ABNORMAL HIGH (ref 0.350–4.500)

## 2011-02-16 LAB — T4, FREE: Free T4: 1.17 ng/dL (ref 0.80–1.80)

## 2011-02-16 LAB — GLUCOSE, CAPILLARY
Glucose-Capillary: 91 mg/dL (ref 70–99)
Glucose-Capillary: 237 mg/dL — ABNORMAL HIGH (ref 70–99)

## 2011-02-16 MED ORDER — CARVEDILOL 3.125 MG PO TABS: 6.2500 mg | ORAL_TABLET | Freq: Two times a day (BID) | ORAL | Status: DC

## 2011-02-16 MED ORDER — CARVEDILOL 6.25 MG PO TABS: 6.2500 mg | ORAL_TABLET | Freq: Two times a day (BID) | ORAL | Status: DC

## 2011-02-16 NOTE — Progress Notes (Signed)
  Subjective:  Doing well, no palpatations  Objective:  Vital Signs in the last 24 hours: Temp:  [98 F (36.7 C)-98.6 F (37 C)] 98 F (36.7 C) (11/08 0500) Pulse Rate:  [47-61] 56  (11/08 0500) Resp:  [18-20] 18  (11/08 0500) BP: (133-167)/(68-87) 167/76 mmHg (11/08 0500) SpO2:  [96 %-98 %] 96 % (11/08 0745) Weight:  [95.1 kg (209 lb 10.5 oz)] 209 lb 10.5 oz (95.1 kg) (11/08 0500)  Intake/Output from previous day: 11/07 0701 - 11/08 0700 In: 240 [P.O.:240] Out: -   Physical Exam: General appearance: alert, cooperative and no distress Lungs: clear to auscultation bilaterally Heart: regular rate and rhythm, S1, S2 normal, no murmur, click, rub or gallop Extremities: extremities normal, atraumatic, no cyanosis or edema   Rate: 50-75  Rhythm: normal sinus rhythm, sinus bradycardia, premature ventricular contractions (PVC) and short runs of bigemeny  Lab Results:  [facility] 02/14/11 0939 02/14/11 0501  WBC 6.3 7.5  HGB 11.2* 11.5*  PLT 244 242    Basename 02/16/11 0705 02/15/11 0534  NA 144 143  K 3.8 3.4*  CL 108 110  CO2 23 25  GLUCOSE 108* 101*  BUN 33* 32*  CREATININE 2.02* 1.87*    Basename 02/14/11 0502  TROPONINI <0.30   Hepatic Function Panel  Basename 02/14/11 0939  PROT 6.2  ALBUMIN 2.9*  AST 10  ALT 7  ALKPHOS 82  BILITOT 0.2*  BILIDIR --  IBILI --    Imaging: No results found.  Cardiac Studies:  Assessment/Plan:   Bigeminy  Hypokaylemia  Palpitations  CAD (coronary atherosclerotic disease)  Atrial fibrillation  Hypertension associated with diabetes  Diabetes mellitus  Chronic renal disease, stage III  Obesity, Class I, BMI 30-34.9    Plan: discharge today after seen by MD, on increased dose of Coreg and potassium.    Corine Shelter PA-C 02/16/2011, 11:10 AM   I have seen and examined the patient along with Corine Shelter, PA. I have reviewed the chart, notes and new data. I agree with Luke's note.  Symptoms greatly improved.  Tolerating higher beta blocker dose without symptomatic bradycardia. Potassium back in normal range on higher dose of supplement. DC home today.

## 2011-03-13 ENCOUNTER — Emergency Department (HOSPITAL_COMMUNITY): Payer: PRIVATE HEALTH INSURANCE

## 2011-03-13 ENCOUNTER — Emergency Department (HOSPITAL_COMMUNITY)
Admission: EM | Admit: 2011-03-13 | Discharge: 2011-03-14 | Disposition: A | Discharge status: Home / Self Care | Payer: PRIVATE HEALTH INSURANCE | Attending: Emergency Medicine | Admitting: Emergency Medicine

## 2011-03-13 ENCOUNTER — Other Ambulatory Visit: Payer: Self-pay

## 2011-03-13 DIAGNOSIS — M129 Arthropathy, unspecified: Secondary | ICD-10-CM | POA: Insufficient documentation

## 2011-03-13 DIAGNOSIS — J45909 Unspecified asthma, uncomplicated: Secondary | ICD-10-CM | POA: Insufficient documentation

## 2011-03-13 DIAGNOSIS — I10 Essential (primary) hypertension: Secondary | ICD-10-CM | POA: Insufficient documentation

## 2011-03-13 DIAGNOSIS — R0989 Other specified symptoms and signs involving the circulatory and respiratory systems: Secondary | ICD-10-CM | POA: Insufficient documentation

## 2011-03-13 DIAGNOSIS — R059 Cough, unspecified: Secondary | ICD-10-CM | POA: Insufficient documentation

## 2011-03-13 DIAGNOSIS — R0609 Other forms of dyspnea: Secondary | ICD-10-CM | POA: Insufficient documentation

## 2011-03-13 DIAGNOSIS — F3289 Other specified depressive episodes: Secondary | ICD-10-CM | POA: Insufficient documentation

## 2011-03-13 DIAGNOSIS — I509 Heart failure, unspecified: Secondary | ICD-10-CM | POA: Insufficient documentation

## 2011-03-13 DIAGNOSIS — E119 Type 2 diabetes mellitus without complications: Secondary | ICD-10-CM | POA: Insufficient documentation

## 2011-03-13 DIAGNOSIS — R110 Nausea: Secondary | ICD-10-CM | POA: Insufficient documentation

## 2011-03-13 DIAGNOSIS — R071 Chest pain on breathing: Secondary | ICD-10-CM | POA: Insufficient documentation

## 2011-03-13 DIAGNOSIS — R05 Cough: Secondary | ICD-10-CM | POA: Insufficient documentation

## 2011-03-13 DIAGNOSIS — F329 Major depressive disorder, single episode, unspecified: Secondary | ICD-10-CM | POA: Insufficient documentation

## 2011-03-13 DIAGNOSIS — IMO0001 Reserved for inherently not codable concepts without codable children: Secondary | ICD-10-CM | POA: Insufficient documentation

## 2011-03-13 DIAGNOSIS — J40 Bronchitis, not specified as acute or chronic: Secondary | ICD-10-CM | POA: Insufficient documentation

## 2011-03-13 DIAGNOSIS — R079 Chest pain, unspecified: Secondary | ICD-10-CM | POA: Insufficient documentation

## 2011-03-13 DIAGNOSIS — E039 Hypothyroidism, unspecified: Secondary | ICD-10-CM | POA: Insufficient documentation

## 2011-03-13 DIAGNOSIS — Z79899 Other long term (current) drug therapy: Secondary | ICD-10-CM | POA: Insufficient documentation

## 2011-03-13 DIAGNOSIS — M79609 Pain in unspecified limb: Secondary | ICD-10-CM | POA: Insufficient documentation

## 2011-03-13 DIAGNOSIS — M2550 Pain in unspecified joint: Secondary | ICD-10-CM | POA: Insufficient documentation

## 2011-03-13 DIAGNOSIS — R0602 Shortness of breath: Secondary | ICD-10-CM | POA: Insufficient documentation

## 2011-03-13 DIAGNOSIS — N289 Disorder of kidney and ureter, unspecified: Secondary | ICD-10-CM | POA: Insufficient documentation

## 2011-03-13 DIAGNOSIS — Z794 Long term (current) use of insulin: Secondary | ICD-10-CM | POA: Insufficient documentation

## 2011-03-13 DIAGNOSIS — I252 Old myocardial infarction: Secondary | ICD-10-CM | POA: Insufficient documentation

## 2011-03-13 DIAGNOSIS — R002 Palpitations: Secondary | ICD-10-CM | POA: Insufficient documentation

## 2011-03-13 DIAGNOSIS — Z9889 Other specified postprocedural states: Secondary | ICD-10-CM | POA: Insufficient documentation

## 2011-03-13 DIAGNOSIS — K219 Gastro-esophageal reflux disease without esophagitis: Secondary | ICD-10-CM | POA: Insufficient documentation

## 2011-03-13 LAB — BASIC METABOLIC PANEL
CO2: 26 mEq/L (ref 19–32)
Calcium: 8.9 mg/dL (ref 8.4–10.5)
Chloride: 106 mEq/L (ref 96–112)
Potassium: 3.6 mEq/L (ref 3.5–5.1)
Sodium: 142 mEq/L (ref 135–145)

## 2011-03-13 LAB — CBC
Platelets: 231 10*3/uL (ref 150–400)
RBC: 3.75 MIL/uL — ABNORMAL LOW (ref 3.87–5.11)
WBC: 7.5 10*3/uL (ref 4.0–10.5)

## 2011-03-13 MED ORDER — ALBUTEROL SULFATE (5 MG/ML) 0.5% IN NEBU
2.5000 mg | INHALATION_SOLUTION | Freq: Once | RESPIRATORY_TRACT | Status: AC
  Administered 2011-03-13: 2.5 mg via RESPIRATORY_TRACT
  Filled 2011-03-13: qty 0.5

## 2011-03-13 MED ORDER — HYDROCODONE-ACETAMINOPHEN 5-325 MG PO TABS
1.0000 | ORAL_TABLET | Freq: Once | ORAL | Status: AC
  Administered 2011-03-13: 1 via ORAL
  Filled 2011-03-13: qty 1

## 2011-03-13 MED ORDER — IPRATROPIUM BROMIDE 0.02 % IN SOLN
0.5000 mg | Freq: Once | RESPIRATORY_TRACT | Status: AC
  Administered 2011-03-13: 0.5 mg via RESPIRATORY_TRACT
  Filled 2011-03-13: qty 2.5

## 2011-03-13 NOTE — ED Notes (Signed)
Pt states when walking she has cp.

## 2011-03-13 NOTE — ED Notes (Signed)
Pt on cardiac monitor in EKG room

## 2011-03-13 NOTE — ED Notes (Signed)
Pt states she began having chest pain after coughing excessively over the last 3 days.

## 2011-03-13 NOTE — ED Notes (Signed)
To ed for eval of sob for past 6 hrs. Coughing clear. Speaking in full clear sentences. No wheezing noted.

## 2011-03-13 NOTE — ED Provider Notes (Signed)
History     CSN: [id number] Arrival date & time: 03/13/2011  4:43 PM   First MD Initiated Contact with Patient 03/13/11 1829      Chief Complaint  Patient presents with  . Shortness of Breath    (Consider location/radiation/quality/duration/timing/severity/associated sxs/prior treatment) Patient is a 65 y.o. female presenting with shortness of breath. The history is provided by the patient.  Shortness of Breath  Associated symptoms include shortness of breath.   she has been sick for the last 3 days with what she thinks is a cold. There's not been any rhinorrhea or sore throat. She's had a cough productive of some clear sputum and gradually worsening dyspnea. Last night she was unable to sleep because of the dyspnea. Dyspnea is worse when she lays flat and when she exerts herself. It is better when she sits still and upright. She has had subjective fever, chills, sweats. There's been some mild nausea. She has also had some arthralgias and myalgias. She denies any sick contacts. She has noted some pain in her anterior chest radiating to her left arm which is worse when she takes a deep breath. Pain is rated at 7/10 when she takes a deep breath and 6/10 at rest.  Past Medical History  Diagnosis Date  . Palpitations   . History of angina   . HTN (hypertension)   . DM (diabetes mellitus)   . CAD (coronary artery disease)   . Bigeminy   . Obesity   . Renal failure   . Spinal headache   . PONV (postoperative nausea and vomiting)   . Adverse effect of anesthesia 05/2009    MI during cardiac  stent placement  . Angina   . Myocardial infarction 05/2009  . Heart murmur   . Asthma   . Shortness of breath     "w/exertion"  . Hypothyroidism     "started med 01/2011"  . Blood transfusion   . Anemia 02/14/11     "I have taken shots for it; I take iron pills now"  . CHF (congestive heart failure)   . Recurrent upper respiratory infection (URI)     "colds every year"  . Recurrent upper  respiratory infection (URI)   . Pneumonia     last time "winter 2011"  . Hiatal hernia   . Depression      "lost son 03/2010"  . GERD (gastroesophageal reflux disease)   . Arthritis     Past Surgical History  Procedure Date  . Cardiac stents. 2010; 05/2009  . Lt vats jan 2012   . Tubal ligation   . Cardiac catheterization     last stent 05/2009; "had MI during this"  . Coronary angioplasty with stent placement 05/2009  . Coronary angioplasty with stent placement 2010  . Abdominal hysterectomy ~ 1986  . Diagnostic laparoscopy post 1986    ovaries removed   . Eye surgery ~ 2009    laser tx   . Fracture surgery 2002    right ankle  . Dilation and curettage of uterus     Family History  Problem Relation Age of Onset  . Cancer      History  Substance Use Topics  . Smoking status: Former Smoker    Quit date: 03/10/1998  . Smokeless tobacco: Never Used  . Alcohol Use: No     "quit early 1990's"    OB History    Grav Para Term Preterm Abortions TAB SAB Ect Mult Living  Review of Systems  Respiratory: Positive for shortness of breath.   All other systems reviewed and are negative.    Allergies  Blue dyes (parenteral) and Erythromycin  Home Medications   Current Outpatient Rx  Name Route Sig Dispense Refill  . ALBUTEROL SULFATE HFA 108 (90 BASE) MCG/ACT IN AERS Inhalation Inhale 2 puffs into the lungs every 6 (six) hours as needed. For shortness of breath/wheezing     . AMIODARONE HCL 200 MG PO TABS Oral Take 100 mg by mouth every morning.      Marland Kitchen AMLODIPINE BESYLATE 10 MG PO TABS Oral Take 10 mg by mouth daily.      . ASPIRIN 81 MG PO TABS Oral Take 81 mg by mouth daily.      Marland Kitchen BIMATOPROST 0.01 % OP SOLN Ophthalmic Apply to eye at bedtime. One drop in each eye at bedtime    . BRIMONIDINE TARTRATE 0.15 % OP SOLN Both Eyes Place 1 drop into both eyes 2 (two) times daily.      Marland Kitchen CLOPIDOGREL BISULFATE 75 MG PO TABS Oral Take 75 mg by mouth every  morning.     Marland Kitchen FERROUS SULFATE 325 (65 FE) MG PO TABS Oral Take 325 mg by mouth 2 (two) times daily.      Marland Kitchen FLUTICASONE-SALMETEROL 250-50 MCG/DOSE IN AEPB Inhalation Inhale 1 puff into the lungs every 12 (twelve) hours.      . FUROSEMIDE 80 MG PO TABS Oral Take 40-80 mg by mouth 2 (two) times daily. 1 take in the am, 0.5 half tab in the pm    . HYDRALAZINE HCL 50 MG PO TABS  50 mg 3 (three) times daily. 2 tabs (100mg ) in the am, 1.5 tabs (75mg ) in the evening about 5pm, 1.5 tabs (75mg ) at 12am    . INSULIN GLARGINE 100 UNIT/ML [place] SOLN Subcutaneous Inject 25 Units into the skin daily after breakfast.     . INSULIN LISPRO (HUMAN) 100 UNIT/ML [place] SOLN Subcutaneous Inject 4-5 Units into the skin 3 (three) times daily between meals as needed. Pt is on a sliding scale     . ISOSORBIDE MONONITRATE ER 60 MG PO TB24 Oral Take 30 mg by mouth every morning.     Marland Kitchen LEVOTHYROXINE SODIUM 75 MCG PO TABS Oral Take 37.5 mcg by mouth every morning.      Marland Kitchen LORAZEPAM 0.5 MG PO TABS Oral Take 0.5 mg by mouth every 8 (eight) hours as needed. For anxiety/sleep     . OMEPRAZOLE 20 MG PO CPDR Oral Take 20 mg by mouth every morning.     Marland Kitchen PARICALCITOL 1 MCG PO CAPS Oral Take 1 mcg by mouth daily.      Marland Kitchen POTASSIUM CHLORIDE CRYS CR 20 MEQ PO TBCR Oral Take 1 tablet (20 mEq total) by mouth 2 (two) times daily. 60 tablet 5  . ROSUVASTATIN CALCIUM 10 MG PO TABS Oral Take 10 mg by mouth daily.      Marland Kitchen TEMAZEPAM 30 MG PO CAPS Oral Take 30 mg by mouth at bedtime as needed.      Marland Kitchen TRAMADOL HCL 50 MG PO TABS Oral Take 50 mg by mouth every 6 (six) hours as needed. Maximum dose= 8 tablets per day, for pain     . NITROGLYCERIN 0.4 MG SL SUBL Sublingual Place 0.4 mg under the tongue every 5 (five) minutes as needed. For chest pain       BP 182/53  Pulse 67  Temp(Src) 99 F (37.2 C) (  Oral)  Resp 27  SpO2 99%  Physical Exam  Nursing note and vitals reviewed.  65 year old female who is resting comfortably and in no acute distress.  Vital signs significant for mild tachypnea respiratory rate of 22 and hypertension blood pressure 182/53. Oxygen saturation is borderline adequate at 92%. Head is normocephalic and atraumatic. PERRLA, EOMI. There's no sinus tenderness. Oropharynx is clear. Neck is supple with JVD noted. There is no adenopathy. Lungs have a slightly prolonged exhalation phase without overt rales, wheezes, or rhonchi. Back is nontender without any CVA tenderness. There is no presacral edema. Heart has regular rate and rhythm without murmur. Abdomen is soft, flat, nontender without masses or hepatosplenomegaly. Extremities have 2+ edema, no cyanosis. Full range of motion present. Skin is warm and moist without rash. Neurologic: Mental status is normal, cranial nerves are intact, there no motor or sensory deficits. Psychiatric: No abnormalities of mood or affect.  ED Course  Procedures (including critical care time)   Labs Reviewed  CBC  BASIC METABOLIC PANEL  POCT CARDIAC MARKERS  POCT B-TYPE NATRIURETIC PEPTIDE(BNP)   No results found. Results for orders placed during the hospital encounter of 03/13/11  CBC      Component Value Range   WBC 7.5  4.0 - 10.5 (K/uL)   RBC 3.75 (*) 3.87 - 5.11 (MIL/uL)   Hemoglobin 11.4 (*) 12.0 - 15.0 (g/dL)   HCT 96.0 (*) 45.4 - 46.0 (%)   MCV 89.9  78.0 - 100.0 (fL)   MCH 30.4  26.0 - 34.0 (pg)   MCHC 33.8  30.0 - 36.0 (g/dL)   RDW 09.8  11.9 - 14.7 (%)   Platelets 231  150 - 400 (K/uL)  BASIC METABOLIC PANEL      Component Value Range   Sodium 142  135 - 145 (mEq/L)   Potassium 3.6  3.5 - 5.1 (mEq/L)   Chloride 106  96 - 112 (mEq/L)   CO2 26  19 - 32 (mEq/L)   Glucose, Bld 148 (*) 70 - 99 (mg/dL)   BUN 24 (*) 6 - 23 (mg/dL)   Creatinine, Ser 8.29 (*) 0.50 - 1.10 (mg/dL)   Calcium 8.9  8.4 - 56.2 (mg/dL)   GFR calc non Af Amer 26 (*) >90 (mL/min)   GFR calc Af Amer 30 (*) >90 (mL/min)  TROPONIN I      Component Value Range   Troponin I <0.30  <0.30 (ng/mL)  PRO B  NATRIURETIC PEPTIDE      Component Value Range   BNP, POC 935.0 (*) 0 - 125 (pg/mL)   Dg Chest 2 View  03/13/2011  *RADIOLOGY REPORT*  Clinical Data: Cough, wheezing and shortness of breath.  CHEST - 2 VIEW  Comparison: 02/14/2011.  Findings: The left hemidiaphragm remains elevated.  This is increased with increased gaseous distention of the stomach.  The heart remains grossly normal in size.  Stable left hilar surgical clips, diffuse peribronchial thickening and diffuse prominence of the interstitial markings.  Post thoracotomy rib deformities on the left.  Diffuse osteopenia.  IMPRESSION: Stable postoperative changes on the left, mild chronic bronchitic changes and mild chronic interstitial lung disease.  No acute abnormality.  Original Report Authenticated By: Darrol Angel, M.D.   Dg Chest 2 View  02/14/2011  *RADIOLOGY REPORT*  Clinical Data: Chest pain, palpitations.  CHEST - 2 VIEW  Comparison: 02/07/2011  Findings: Left sided volume loss, in keeping with postsurgical changes.  Surgical clips project over the left hilum. No focal areas  of consolidation.  No pleural effusion or pneumothorax.  The left hemidiaphragm is elevated. Heart size upper normal limits to mildly enlarged.  Aortic arch atherosclerotic calcification. Multilevel degenerative changes without acute osseous abnormality identified.  IMPRESSION: Stable postoperative changes involving the left lung.  No definite acute process identified.  Original Report Authenticated By: Waneta Martins, M.D.      No diagnosis found.  Chest x-ray did not show evidence of pulmonary edema. She was given an albuterol with Atrovent nebulizer treatment with subjective improvement. Symptoms started to worsen, and she was given a second albuterol nebulizer treatment with improvement once again. Laboratory workup shows that her beta natruretic protein is essentially the same range that she normally runs. She will be treated for bronchitis with  instructions to use her albuterol inhaler aggressively.   MDM  Dyspnea which is probably multifactorial with some component of a respiratory tract infection and some component of congestive heart failure.        Dione Booze, MD 03/14/11 [phone]

## 2011-03-14 ENCOUNTER — Encounter (HOSPITAL_COMMUNITY): Payer: Self-pay | Admitting: *Deleted

## 2011-03-14 LAB — TROPONIN I: Troponin I: <0.3 ng/mL (ref <0.30)

## 2011-03-14 LAB — PRO B NATRIURETIC PEPTIDE: Pro B Natriuretic peptide (BNP): 935 pg/mL — ABNORMAL HIGH (ref 0–125)

## 2011-03-14 MED ORDER — HYDROCODONE-ACETAMINOPHEN 5-325 MG PO TABS: 1.0000 | ORAL_TABLET | ORAL | Status: AC | PRN

## 2011-03-16 ENCOUNTER — Other Ambulatory Visit: Payer: Self-pay

## 2011-03-16 ENCOUNTER — Emergency Department (HOSPITAL_COMMUNITY)
Admission: EM | Admit: 2011-03-16 | Discharge: 2011-03-16 | Disposition: A | Discharge status: Home / Self Care | Payer: PRIVATE HEALTH INSURANCE | Attending: Emergency Medicine | Admitting: Emergency Medicine

## 2011-03-16 ENCOUNTER — Emergency Department (HOSPITAL_COMMUNITY): Payer: PRIVATE HEALTH INSURANCE

## 2011-03-16 ENCOUNTER — Encounter (HOSPITAL_COMMUNITY): Payer: Self-pay | Admitting: Emergency Medicine

## 2011-03-16 DIAGNOSIS — I509 Heart failure, unspecified: Secondary | ICD-10-CM | POA: Insufficient documentation

## 2011-03-16 DIAGNOSIS — I252 Old myocardial infarction: Secondary | ICD-10-CM | POA: Insufficient documentation

## 2011-03-16 DIAGNOSIS — J40 Bronchitis, not specified as acute or chronic: Secondary | ICD-10-CM

## 2011-03-16 DIAGNOSIS — E119 Type 2 diabetes mellitus without complications: Secondary | ICD-10-CM | POA: Insufficient documentation

## 2011-03-16 DIAGNOSIS — R05 Cough: Secondary | ICD-10-CM | POA: Insufficient documentation

## 2011-03-16 DIAGNOSIS — I129 Hypertensive chronic kidney disease with stage 1 through stage 4 chronic kidney disease, or unspecified chronic kidney disease: Secondary | ICD-10-CM | POA: Insufficient documentation

## 2011-03-16 DIAGNOSIS — Z794 Long term (current) use of insulin: Secondary | ICD-10-CM | POA: Insufficient documentation

## 2011-03-16 DIAGNOSIS — M129 Arthropathy, unspecified: Secondary | ICD-10-CM | POA: Insufficient documentation

## 2011-03-16 DIAGNOSIS — M79609 Pain in unspecified limb: Secondary | ICD-10-CM | POA: Insufficient documentation

## 2011-03-16 DIAGNOSIS — F329 Major depressive disorder, single episode, unspecified: Secondary | ICD-10-CM | POA: Insufficient documentation

## 2011-03-16 DIAGNOSIS — E039 Hypothyroidism, unspecified: Secondary | ICD-10-CM | POA: Insufficient documentation

## 2011-03-16 DIAGNOSIS — Z7982 Long term (current) use of aspirin: Secondary | ICD-10-CM | POA: Insufficient documentation

## 2011-03-16 DIAGNOSIS — J45909 Unspecified asthma, uncomplicated: Secondary | ICD-10-CM | POA: Insufficient documentation

## 2011-03-16 DIAGNOSIS — R059 Cough, unspecified: Secondary | ICD-10-CM | POA: Insufficient documentation

## 2011-03-16 DIAGNOSIS — F3289 Other specified depressive episodes: Secondary | ICD-10-CM | POA: Insufficient documentation

## 2011-03-16 DIAGNOSIS — Z79899 Other long term (current) drug therapy: Secondary | ICD-10-CM | POA: Insufficient documentation

## 2011-03-16 DIAGNOSIS — R079 Chest pain, unspecified: Secondary | ICD-10-CM | POA: Insufficient documentation

## 2011-03-16 DIAGNOSIS — N189 Chronic kidney disease, unspecified: Secondary | ICD-10-CM | POA: Insufficient documentation

## 2011-03-16 DIAGNOSIS — K219 Gastro-esophageal reflux disease without esophagitis: Secondary | ICD-10-CM | POA: Insufficient documentation

## 2011-03-16 DIAGNOSIS — I2510 Atherosclerotic heart disease of native coronary artery without angina pectoris: Secondary | ICD-10-CM | POA: Insufficient documentation

## 2011-03-16 LAB — DIFFERENTIAL
Basophils Absolute: 0 10*3/uL (ref 0.0–0.1)
Basophils Relative: 0 % (ref 0–1)
Monocytes Absolute: 0.6 10*3/uL (ref 0.1–1.0)
Neutro Abs: 4.7 10*3/uL (ref 1.7–7.7)
Neutrophils Relative %: 60 % (ref 43–77)

## 2011-03-16 LAB — BASIC METABOLIC PANEL
Calcium: 9.3 mg/dL (ref 8.4–10.5)
Chloride: 109 mEq/L (ref 96–112)
Creatinine, Ser: 1.78 mg/dL — ABNORMAL HIGH (ref 0.50–1.10)
GFR calc Af Amer: 33 mL/min — ABNORMAL LOW (ref >90)
Sodium: 144 mEq/L (ref 135–145)

## 2011-03-16 LAB — CBC
HCT: 34.5 % — ABNORMAL LOW (ref 36.0–46.0)
MCHC: 33.3 g/dL (ref 30.0–36.0)
Platelets: 220 10*3/uL (ref 150–400)
RDW: 14.5 % (ref 11.5–15.5)

## 2011-03-16 LAB — CK TOTAL AND CKMB (NOT AT ARMC): Total CK: 227 U/L — ABNORMAL HIGH (ref 7–177)

## 2011-03-16 LAB — TROPONIN I: Troponin I: <0.3 ng/mL (ref <0.30)

## 2011-03-16 MED ORDER — AMOXICILLIN 500 MG PO CAPS
500.0000 mg | ORAL_CAPSULE | Freq: Once | ORAL | Status: AC
  Administered 2011-03-16: 500 mg via ORAL
  Filled 2011-03-16: qty 1

## 2011-03-16 MED ORDER — AMOXICILLIN 500 MG PO CAPS: 500.0000 mg | ORAL_CAPSULE | Freq: Three times a day (TID) | ORAL | Status: AC

## 2011-03-16 NOTE — ED Provider Notes (Signed)
History     CSN: [id number] Arrival date & time: 03/16/2011 12:29 AM   First MD Initiated Contact with Patient 03/16/11 0046      Chief Complaint  Patient presents with  . Chest Pain    (Consider location/radiation/quality/duration/timing/severity/associated sxs/prior treatment) HPI Comments: Here two nights ago and told she had bronchitis.  Returns tonight complaining of pains in chest and left arm.  She has a history of stents and denies that this is similar to her heart pain.  Patient is a 65 y.o. female presenting with chest pain.  Chest Pain The chest pain began 6 - 12 hours ago. Chest pain occurs constantly. The chest pain is unchanged. The pain is associated with breathing. The severity of the pain is mild. The quality of the pain is described as aching. The pain does not radiate. Chest pain is worsened by certain positions and deep breathing. Pertinent negatives for primary symptoms include no fever and no fatigue. She tried nothing for the symptoms.     Past Medical History  Diagnosis Date  . Palpitations   . History of angina   . HTN (hypertension)   . DM (diabetes mellitus)   . CAD (coronary artery disease)   . Bigeminy   . Obesity   . Renal failure   . Spinal headache   . PONV (postoperative nausea and vomiting)   . Adverse effect of anesthesia 05/2009    MI during cardiac  stent placement  . Angina   . Myocardial infarction 05/2009  . Heart murmur   . Asthma   . Shortness of breath     "w/exertion"  . Hypothyroidism     "started med 01/2011"  . Blood transfusion   . Anemia 02/14/11     "I have taken shots for it; I take iron pills now"  . CHF (congestive heart failure)   . Recurrent upper respiratory infection (URI)     "colds every year"  . Recurrent upper respiratory infection (URI)   . Pneumonia     last time "winter 2011"  . Hiatal hernia   . Depression      "lost son 03/2010"  . GERD (gastroesophageal reflux disease)   . Arthritis     Past  Surgical History  Procedure Date  . Cardiac stents. 2010; 05/2009  . Lt vats jan 2012   . Tubal ligation   . Cardiac catheterization     last stent 05/2009; "had MI during this"  . Coronary angioplasty with stent placement 05/2009  . Coronary angioplasty with stent placement 2010  . Abdominal hysterectomy ~ 1986  . Diagnostic laparoscopy post 1986    ovaries removed   . Eye surgery ~ 2009    laser tx   . Fracture surgery 2002    right ankle  . Dilation and curettage of uterus     Family History  Problem Relation Age of Onset  . Cancer      History  Substance Use Topics  . Smoking status: Former Smoker    Quit date: 03/10/1998  . Smokeless tobacco: Never Used  . Alcohol Use: No     "quit early 1990's"    OB History    Grav Para Term Preterm Abortions TAB SAB Ect Mult Living                  Review of Systems  Constitutional: Negative for fever and fatigue.  Cardiovascular: Positive for chest pain.  All other systems reviewed and are negative.  Allergies  Blue dyes (parenteral) and Erythromycin  Home Medications   Current Outpatient Rx  Name Route Sig Dispense Refill  . ALBUTEROL SULFATE HFA 108 (90 BASE) MCG/ACT IN AERS Inhalation Inhale 2 puffs into the lungs every 6 (six) hours as needed. For shortness of breath/wheezing     . AMIODARONE HCL 200 MG PO TABS Oral Take 100 mg by mouth every morning.      Marland Kitchen AMLODIPINE BESYLATE 10 MG PO TABS Oral Take 10 mg by mouth daily.      . ASPIRIN 81 MG PO TABS Oral Take 81 mg by mouth daily.      Marland Kitchen BIMATOPROST 0.01 % OP SOLN Ophthalmic Apply to eye at bedtime. One drop in each eye at bedtime    . BRIMONIDINE TARTRATE 0.15 % OP SOLN Both Eyes Place 1 drop into both eyes 2 (two) times daily.      Marland Kitchen CLOPIDOGREL BISULFATE 75 MG PO TABS Oral Take 75 mg by mouth every morning.     Marland Kitchen FERROUS SULFATE 325 (65 FE) MG PO TABS Oral Take 325 mg by mouth 2 (two) times daily.      Marland Kitchen FLUTICASONE-SALMETEROL 250-50 MCG/DOSE IN AEPB  Inhalation Inhale 1 puff into the lungs every 12 (twelve) hours.      . FUROSEMIDE 80 MG PO TABS Oral Take 40-80 mg by mouth 2 (two) times daily. 1 take in the am, 0.5 half tab in the pm    . HYDRALAZINE HCL 50 MG PO TABS  50 mg 3 (three) times daily. 2 tabs (100mg ) in the am, 1.5 tabs (75mg ) in the evening about 5pm, 1.5 tabs (75mg ) at 12am    . HYDROCODONE-ACETAMINOPHEN 5-325 MG PO TABS Oral Take 1 tablet by mouth every 4 (four) hours as needed for pain. 20 tablet 0  . INSULIN GLARGINE 100 UNIT/ML [place] SOLN Subcutaneous Inject 25 Units into the skin daily after breakfast.     . INSULIN LISPRO (HUMAN) 100 UNIT/ML [place] SOLN Subcutaneous Inject 4-5 Units into the skin 3 (three) times daily between meals as needed. Pt is on a sliding scale     . ISOSORBIDE MONONITRATE ER 60 MG PO TB24 Oral Take 30 mg by mouth every morning.     Marland Kitchen LEVOTHYROXINE SODIUM 75 MCG PO TABS Oral Take 37.5 mcg by mouth every morning.      Marland Kitchen LORAZEPAM 0.5 MG PO TABS Oral Take 0.5 mg by mouth every 8 (eight) hours as needed. For anxiety/sleep     . NITROGLYCERIN 0.4 MG SL SUBL Sublingual Place 0.4 mg under the tongue every 5 (five) minutes as needed. For chest pain     . OMEPRAZOLE 20 MG PO CPDR Oral Take 20 mg by mouth every morning.     Marland Kitchen PARICALCITOL 1 MCG PO CAPS Oral Take 1 mcg by mouth daily.      Marland Kitchen POTASSIUM CHLORIDE CRYS CR 20 MEQ PO TBCR Oral Take 1 tablet (20 mEq total) by mouth 2 (two) times daily. 60 tablet 5  . ROSUVASTATIN CALCIUM 10 MG PO TABS Oral Take 10 mg by mouth daily.      Marland Kitchen TEMAZEPAM 30 MG PO CAPS Oral Take 30 mg by mouth at bedtime as needed.      Marland Kitchen TRAMADOL HCL 50 MG PO TABS Oral Take 50 mg by mouth every 6 (six) hours as needed. Maximum dose= 8 tablets per day, for pain       BP 129/98  Pulse 76  Temp(Src) 98.4 F (  36.9 C) (Oral)  Resp 20  SpO2 99%  Physical Exam  Nursing note and vitals reviewed. Constitutional: She is oriented to person, place, and time. She appears well-developed and  well-nourished. No distress.  HENT:  Head: Normocephalic and atraumatic.  Neck: Normal range of motion. Neck supple.  Cardiovascular: Normal rate and regular rhythm.  Exam reveals no gallop and no friction rub.   No murmur heard. Pulmonary/Chest: Effort normal and breath sounds normal. No respiratory distress. She has no wheezes.  Abdominal: Soft. Bowel sounds are normal. She exhibits no distension. There is no tenderness.  Musculoskeletal: Normal range of motion.  Neurological: She is alert and oriented to person, place, and time.  Skin: Skin is warm and dry. She is not diaphoretic.    ED Course  Procedures (including critical care time)  Labs Reviewed - No data to display No results found.   No diagnosis found.   Date: 03/16/2011  Rate: 80  Rhythm: premature ventricular contractions (PVC)  QRS Axis: normal  Intervals: normal  ST/T Wave abnormalities: normal  Conduction Disutrbances:none  Narrative Interpretation:   Old EKG Reviewed: unchanged    MDM  Patient with cough, discomfort in chest, different from prior cardiac symptoms.  Workup okay, patient wants to go home.  She is leaving for [place] for the holidays and just wanted to make sure she was okay for travel.  Will discharge, to return prn.        Geoffery Lyons, MD 03/16/11 [phone]

## 2012-02-27 ENCOUNTER — Emergency Department (HOSPITAL_COMMUNITY): Payer: PRIVATE HEALTH INSURANCE

## 2012-02-27 ENCOUNTER — Inpatient Hospital Stay (HOSPITAL_COMMUNITY)
Admission: EM | Admit: 2012-02-27 | Discharge: 2012-03-03 | DRG: 261 | Disposition: A | Discharge status: Home / Self Care | Payer: PRIVATE HEALTH INSURANCE | Attending: Cardiovascular Disease | Admitting: Cardiovascular Disease

## 2012-02-27 ENCOUNTER — Encounter (HOSPITAL_COMMUNITY): Payer: Self-pay | Admitting: *Deleted

## 2012-02-27 DIAGNOSIS — I129 Hypertensive chronic kidney disease with stage 1 through stage 4 chronic kidney disease, or unspecified chronic kidney disease: Secondary | ICD-10-CM | POA: Diagnosis present

## 2012-02-27 DIAGNOSIS — Z79899 Other long term (current) drug therapy: Secondary | ICD-10-CM

## 2012-02-27 DIAGNOSIS — I480 Paroxysmal atrial fibrillation: Secondary | ICD-10-CM | POA: Diagnosis present

## 2012-02-27 DIAGNOSIS — R002 Palpitations: Secondary | ICD-10-CM | POA: Diagnosis present

## 2012-02-27 DIAGNOSIS — E876 Hypokalemia: Secondary | ICD-10-CM | POA: Diagnosis present

## 2012-02-27 DIAGNOSIS — Z91041 Radiographic dye allergy status: Secondary | ICD-10-CM | POA: Diagnosis present

## 2012-02-27 DIAGNOSIS — I493 Ventricular premature depolarization: Secondary | ICD-10-CM | POA: Diagnosis present

## 2012-02-27 DIAGNOSIS — R9431 Abnormal electrocardiogram [ECG] [EKG]: Secondary | ICD-10-CM | POA: Diagnosis not present

## 2012-02-27 DIAGNOSIS — R55 Syncope and collapse: Secondary | ICD-10-CM | POA: Diagnosis present

## 2012-02-27 DIAGNOSIS — E669 Obesity, unspecified: Secondary | ICD-10-CM | POA: Diagnosis present

## 2012-02-27 DIAGNOSIS — I2510 Atherosclerotic heart disease of native coronary artery without angina pectoris: Principal | ICD-10-CM | POA: Diagnosis present

## 2012-02-27 DIAGNOSIS — I10 Essential (primary) hypertension: Secondary | ICD-10-CM | POA: Diagnosis present

## 2012-02-27 DIAGNOSIS — E785 Hyperlipidemia, unspecified: Secondary | ICD-10-CM | POA: Diagnosis present

## 2012-02-27 DIAGNOSIS — I509 Heart failure, unspecified: Secondary | ICD-10-CM | POA: Diagnosis present

## 2012-02-27 DIAGNOSIS — E119 Type 2 diabetes mellitus without complications: Secondary | ICD-10-CM | POA: Diagnosis present

## 2012-02-27 DIAGNOSIS — I5032 Chronic diastolic (congestive) heart failure: Secondary | ICD-10-CM | POA: Diagnosis present

## 2012-02-27 DIAGNOSIS — R0789 Other chest pain: Secondary | ICD-10-CM | POA: Diagnosis present

## 2012-02-27 DIAGNOSIS — I4891 Unspecified atrial fibrillation: Secondary | ICD-10-CM | POA: Diagnosis present

## 2012-02-27 DIAGNOSIS — I472 Ventricular tachycardia: Secondary | ICD-10-CM | POA: Diagnosis not present

## 2012-02-27 DIAGNOSIS — N183 Chronic kidney disease, stage 3 unspecified: Secondary | ICD-10-CM | POA: Diagnosis present

## 2012-02-27 DIAGNOSIS — R079 Chest pain, unspecified: Secondary | ICD-10-CM | POA: Diagnosis present

## 2012-02-27 HISTORY — DX: Ventricular tachycardia: I47.2

## 2012-02-27 LAB — BASIC METABOLIC PANEL
BUN: 67 mg/dL — ABNORMAL HIGH (ref 6–23)
Calcium: 10 mg/dL (ref 8.4–10.5)
GFR calc non Af Amer: 18 mL/min — ABNORMAL LOW (ref >90)
Glucose, Bld: 112 mg/dL — ABNORMAL HIGH (ref 70–99)

## 2012-02-27 LAB — GLUCOSE, CAPILLARY: Glucose-Capillary: 159 mg/dL — ABNORMAL HIGH (ref 70–99)

## 2012-02-27 LAB — POTASSIUM: Potassium: 2.6 mEq/L — CL (ref 3.5–5.1)

## 2012-02-27 LAB — CBC
HCT: 34.8 % — ABNORMAL LOW (ref 36.0–46.0)
Hemoglobin: 12 g/dL (ref 12.0–15.0)
MCH: 30.2 pg (ref 26.0–34.0)
MCHC: 34.5 g/dL (ref 30.0–36.0)

## 2012-02-27 LAB — TROPONIN I: Troponin I: <0.3 ng/mL (ref <0.30)

## 2012-02-27 MED ORDER — POTASSIUM CHLORIDE CRYS ER 20 MEQ PO TBCR: 40.0000 meq | EXTENDED_RELEASE_TABLET | Freq: Once | ORAL | Status: DC

## 2012-02-27 MED ORDER — MOMETASONE FURO-FORMOTEROL FUM 200-5 MCG/ACT IN AERO
2.0000 | INHALATION_SPRAY | Freq: Two times a day (BID) | RESPIRATORY_TRACT | Status: DC
  Administered 2012-02-27 – 2012-03-03 (×8): 2 via RESPIRATORY_TRACT
  Filled 2012-02-27: qty 8.8

## 2012-02-27 MED ORDER — POTASSIUM CHLORIDE CRYS ER 20 MEQ PO TBCR
40.0000 meq | EXTENDED_RELEASE_TABLET | Freq: Once | ORAL | Status: AC
  Administered 2012-02-27: 40 meq via ORAL
  Filled 2012-02-27: qty 2

## 2012-02-27 MED ORDER — BRIMONIDINE TARTRATE 0.2 % OP SOLN
1.0000 [drp] | Freq: Two times a day (BID) | OPHTHALMIC | Status: DC
  Administered 2012-02-28 – 2012-03-03 (×9): 1 [drp] via OPHTHALMIC
  Filled 2012-02-27: qty 5

## 2012-02-27 MED ORDER — HYDRALAZINE HCL 50 MG PO TABS
50.0000 mg | ORAL_TABLET | Freq: Three times a day (TID) | ORAL | Status: DC
  Administered 2012-02-28 – 2012-03-03 (×11): 50 mg via ORAL
  Filled 2012-02-27 (×18): qty 1

## 2012-02-27 MED ORDER — VERAPAMIL HCL 180 MG (CO) PO TB24
180.0000 mg | ORAL_TABLET | Freq: Two times a day (BID) | ORAL | Status: DC
  Administered 2012-02-28: 180 mg via ORAL
  Filled 2012-02-27 (×6): qty 1

## 2012-02-27 MED ORDER — POTASSIUM CHLORIDE CRYS ER 20 MEQ PO TBCR
40.0000 meq | EXTENDED_RELEASE_TABLET | Freq: Once | ORAL | Status: AC
  Administered 2012-02-28: 40 meq via ORAL
  Filled 2012-02-27: qty 2

## 2012-02-27 MED ORDER — MECLIZINE HCL 25 MG PO TABS
25.0000 mg | ORAL_TABLET | Freq: Three times a day (TID) | ORAL | Status: DC | PRN
  Filled 2012-02-27: qty 1

## 2012-02-27 MED ORDER — FERROUS SULFATE 325 (65 FE) MG PO TABS
325.0000 mg | ORAL_TABLET | Freq: Two times a day (BID) | ORAL | Status: DC
  Administered 2012-02-28 – 2012-03-03 (×9): 325 mg via ORAL
  Filled 2012-02-27 (×11): qty 1

## 2012-02-27 MED ORDER — BIMATOPROST 0.01 % OP SOLN
1.0000 [drp] | Freq: Every day | OPHTHALMIC | Status: DC
  Administered 2012-02-28 – 2012-03-02 (×4): 1 [drp] via OPHTHALMIC
  Filled 2012-02-27: qty 2.5

## 2012-02-27 MED ORDER — ZOLPIDEM TARTRATE 5 MG PO TABS
5.0000 mg | ORAL_TABLET | Freq: Every evening | ORAL | Status: DC | PRN
  Administered 2012-02-27 – 2012-03-02 (×5): 5 mg via ORAL
  Filled 2012-02-27 (×5): qty 1

## 2012-02-27 MED ORDER — ALBUTEROL SULFATE HFA 108 (90 BASE) MCG/ACT IN AERS
2.0000 | INHALATION_SPRAY | Freq: Four times a day (QID) | RESPIRATORY_TRACT | Status: DC | PRN
  Filled 2012-02-27: qty 6.7

## 2012-02-27 MED ORDER — POTASSIUM CHLORIDE CRYS ER 20 MEQ PO TBCR
40.0000 meq | EXTENDED_RELEASE_TABLET | Freq: Once | ORAL | Status: AC
  Administered 2012-02-27: 40 meq via ORAL

## 2012-02-27 MED ORDER — NITROGLYCERIN 2 % TD OINT
1.0000 [in_us] | TOPICAL_OINTMENT | Freq: Four times a day (QID) | TRANSDERMAL | Status: DC
  Administered 2012-02-27: 1 [in_us] via TOPICAL
  Filled 2012-02-27 (×2): qty 1

## 2012-02-27 MED ORDER — ACETAMINOPHEN 325 MG PO TABS: 650.0000 mg | ORAL_TABLET | ORAL | Status: DC | PRN

## 2012-02-27 MED ORDER — TRAMADOL HCL 50 MG PO TABS
50.0000 mg | ORAL_TABLET | Freq: Four times a day (QID) | ORAL | Status: DC | PRN
  Filled 2012-02-27 (×2): qty 1

## 2012-02-27 MED ORDER — POTASSIUM CHLORIDE CRYS ER 20 MEQ PO TBCR
20.0000 meq | EXTENDED_RELEASE_TABLET | Freq: Every day | ORAL | Status: DC
  Administered 2012-02-28: 20 meq via ORAL
  Filled 2012-02-27: qty 1
  Filled 2012-02-27: qty 3
  Filled 2012-02-27: qty 1

## 2012-02-27 MED ORDER — BRIMONIDINE TARTRATE 0.15 % OP SOLN
1.0000 [drp] | Freq: Two times a day (BID) | OPHTHALMIC | Status: DC
Start: 2012-02-27 — End: 2012-02-27
  Filled 2012-02-27: qty 5

## 2012-02-27 MED ORDER — INSULIN ASPART 100 UNIT/ML ~~LOC~~ SOLN: 0.0000 [IU] | Freq: Every day | SUBCUTANEOUS | Status: DC

## 2012-02-27 MED ORDER — FUROSEMIDE 40 MG PO TABS
40.0000 mg | ORAL_TABLET | Freq: Every day | ORAL | Status: DC
  Administered 2012-02-28 – 2012-03-03 (×5): 40 mg via ORAL
  Filled 2012-02-27 (×6): qty 1

## 2012-02-27 MED ORDER — CYCLOBENZAPRINE HCL 10 MG PO TABS
5.0000 mg | ORAL_TABLET | Freq: Three times a day (TID) | ORAL | Status: DC | PRN
  Administered 2012-03-02: 5 mg via ORAL
  Filled 2012-02-27: qty 0.5
  Filled 2012-02-27: qty 1

## 2012-02-27 MED ORDER — HEPARIN BOLUS VIA INFUSION
4000.0000 [IU] | Freq: Once | INTRAVENOUS | Status: AC
  Administered 2012-02-27: 4000 [IU] via INTRAVENOUS
  Filled 2012-02-27: qty 4000

## 2012-02-27 MED ORDER — INSULIN ASPART 100 UNIT/ML ~~LOC~~ SOLN
0.0000 [IU] | Freq: Three times a day (TID) | SUBCUTANEOUS | Status: DC
  Administered 2012-02-28: 3 [IU] via SUBCUTANEOUS
  Administered 2012-02-28 (×2): 5 [IU] via SUBCUTANEOUS
  Administered 2012-02-29 – 2012-03-02 (×4): 2 [IU] via SUBCUTANEOUS
  Administered 2012-03-02 – 2012-03-03 (×2): 3 [IU] via SUBCUTANEOUS

## 2012-02-27 MED ORDER — MORPHINE SULFATE 2 MG/ML IJ SOLN
2.0000 mg | INTRAMUSCULAR | Status: DC | PRN
  Administered 2012-02-27: 2 mg via INTRAVENOUS
  Filled 2012-02-27: qty 1

## 2012-02-27 MED ORDER — POTASSIUM CHLORIDE CRYS ER 20 MEQ PO TBCR
EXTENDED_RELEASE_TABLET | ORAL | Status: AC
  Filled 2012-02-27: qty 2

## 2012-02-27 MED ORDER — NITROGLYCERIN IN D5W 200-5 MCG/ML-% IV SOLN
2.0000 ug/min | INTRAVENOUS | Status: DC
  Administered 2012-02-27: 7 ug/min via INTRAVENOUS
  Filled 2012-02-27: qty 250

## 2012-02-27 MED ORDER — MORPHINE SULFATE 4 MG/ML IJ SOLN
4.0000 mg | INTRAMUSCULAR | Status: DC | PRN
  Administered 2012-03-02 (×2): 4 mg via INTRAVENOUS
  Filled 2012-02-27 (×2): qty 1

## 2012-02-27 MED ORDER — ASPIRIN 81 MG PO TABS: 81.0000 mg | ORAL_TABLET | Freq: Every day | ORAL | Status: DC

## 2012-02-27 MED ORDER — ATORVASTATIN CALCIUM 10 MG PO TABS
10.0000 mg | ORAL_TABLET | Freq: Every day | ORAL | Status: DC
  Administered 2012-02-28 – 2012-03-02 (×4): 10 mg via ORAL
  Filled 2012-02-27 (×5): qty 1

## 2012-02-27 MED ORDER — INSULIN GLARGINE 100 UNIT/ML ~~LOC~~ SOLN
25.0000 [IU] | Freq: Every day | SUBCUTANEOUS | Status: DC
  Administered 2012-02-28: 25 [IU] via SUBCUTANEOUS
  Administered 2012-03-01: 12.5 [IU] via SUBCUTANEOUS
  Administered 2012-03-02 – 2012-03-03 (×2): 25 [IU] via SUBCUTANEOUS

## 2012-02-27 MED ORDER — ASPIRIN 81 MG PO CHEW
81.0000 mg | CHEWABLE_TABLET | Freq: Every day | ORAL | Status: DC
  Administered 2012-02-28 – 2012-03-03 (×4): 81 mg via ORAL
  Filled 2012-02-27 (×4): qty 1

## 2012-02-27 MED ORDER — ONDANSETRON HCL 4 MG/2ML IJ SOLN: 4.0000 mg | Freq: Four times a day (QID) | INTRAMUSCULAR | Status: DC | PRN

## 2012-02-27 MED ORDER — PANTOPRAZOLE SODIUM 40 MG PO TBEC: 40.0000 mg | DELAYED_RELEASE_TABLET | Freq: Every day | ORAL | Status: DC

## 2012-02-27 MED ORDER — HEPARIN (PORCINE) IN NACL 100-0.45 UNIT/ML-% IJ SOLN
1000.0000 [IU]/h | INTRAMUSCULAR | Status: DC
  Administered 2012-02-27: 1000 [IU]/h via INTRAVENOUS
  Filled 2012-02-27 (×4): qty 250

## 2012-02-27 MED ORDER — LEVOTHYROXINE SODIUM 100 MCG PO TABS
100.0000 ug | ORAL_TABLET | Freq: Every day | ORAL | Status: DC
  Administered 2012-02-28 – 2012-03-03 (×5): 100 ug via ORAL
  Filled 2012-02-27 (×7): qty 1

## 2012-02-27 MED ORDER — ALPRAZOLAM 0.25 MG PO TABS
0.2500 mg | ORAL_TABLET | Freq: Three times a day (TID) | ORAL | Status: DC | PRN
  Administered 2012-02-27: 0.25 mg via ORAL
  Filled 2012-02-27: qty 1

## 2012-02-27 MED ORDER — CLOPIDOGREL BISULFATE 75 MG PO TABS
75.0000 mg | ORAL_TABLET | ORAL | Status: DC
  Administered 2012-02-28 – 2012-03-03 (×5): 75 mg via ORAL
  Filled 2012-02-27 (×7): qty 1

## 2012-02-27 NOTE — H&P (Signed)
Patient ID: Wanda [name] MRN: [id number], DOB/AGE: Apr 29, 1945   Admit date: 02/27/2012   Primary Physician: Pearson Grippe, MD Primary Cardiologist: Dr Royann Shivers  HPI: Wanda [name] is an unfortunate 66 y/o female who we have followed since November 2011. She travels back and forth between here and [facility]. She has had several caths-'90s, 2000, 2005 showing no significant CAD. In Aug 2009 she had a BMS placed to her Dx. In Jan 2011 she had an LAD stent while in [place]. Follow up cath for chest pain one month later was OK. We saw her in November for chest pain. Work up revealed a lung lesion suspicious for cancer. She went back to [facility] and had a VATs. Apparently the diagnosis was not cancer. She came to [facility] in 2012 and had at least 3 admissions for chest pain with a low risk Myoview. Echo has shown NL LVF with diastolic dysfunction. We last saw her in Sept 2012 and then she went back to [facility] (DR Lovena Neighbours is her cardiologist in [place]). She presented to the office today to see Dr Royann Shivers. The pt apparently had a syncopal spell recently and was taken off Amiodarone and Norvasc, and put on Verapamil. She has had bradycardia in the past with Coreg. Dr Royann Shivers had planned to put in a loop recorder later this week. She left the office and then decided to come to the ER to be seen for chest pain. She is a vague historian, chest "soreness" and she admits she is afraid she might have a heart attack. She tells me while she was in [place] she went to the hospital " about 20 times " for chest pain. She has not been re-cathed per her history (Scr 2.4). Today EKG shows PVCs, PACs. Her BNP is slightly elevated 1334, Troponin negative.   Problem List: Past Medical History  Diagnosis Date  . Palpitations   . History of angina   . HTN (hypertension)   . DM (diabetes mellitus)   . CAD (coronary artery disease)   . Bigeminy   . Obesity   . Renal failure   . Spinal headache   . PONV (postoperative nausea and  vomiting)   . Adverse effect of anesthesia 05/2009    MI during cardiac  stent placement  . Angina   . Myocardial infarction 05/2009  . Heart murmur   . Asthma   . Shortness of breath     "w/exertion"  . Hypothyroidism     "started med 01/2011"  . Blood transfusion   . Anemia 02/14/11     "I have taken shots for it; I take iron pills now"  . CHF (congestive heart failure)   . Recurrent upper respiratory infection (URI)     "colds every year"  . Recurrent upper respiratory infection (URI)   . Pneumonia     last time "winter 2011"  . Hiatal hernia   . Depression      "lost son 03/2010"  . GERD (gastroesophageal reflux disease)   . Arthritis     Past Surgical History  Procedure Date  . Cardiac stents. 2010; 05/2009  . Lt vats jan 2012   . Tubal ligation   . Cardiac catheterization     last stent 05/2009; "had MI during this"  . Coronary angioplasty with stent placement 05/2009  . Coronary angioplasty with stent placement 2010  . Abdominal hysterectomy ~ 1986  . Diagnostic laparoscopy post 1986    ovaries removed   . Eye surgery ~  2009    laser tx   . Fracture surgery 2002    right ankle  . Dilation and curettage of uterus      Allergies:  Allergies  Allergen Reactions  . Contrast Media (Iodinated Diagnostic Agents) Swelling  . Blue Dyes (Parenteral) Swelling       . Erythromycin Palpitations     Home Medications  See Med Rec   Family History  Problem Relation Age of Onset  . Cancer       History   Social History  . Marital Status: Divorced    Spouse Name: N/A    Number of Children: N/A  . Years of Education: N/A   Occupational History  . Not on file.   Social History Main Topics  . Smoking status: Former Smoker    Quit date: 03/10/1998  . Smokeless tobacco: Never Used  . Alcohol Use: No     Comment: "quit early 1990's"  . Drug Use: No  . Sexually Active: Not on file   Other Topics Concern  . Not on file   Social History Narrative   The  patient lives with her daughter in [place], also is  staying with her sister now in [place].  She does not smoke or drink  alcohol.        Review of Systems: General: negative for chills, fever, night sweats or weight changes.  Cardiovascular: negative for chest pain, dyspnea on exertion, edema, orthopnea, palpitations, paroxysmal nocturnal dyspnea or shortness of breath Dermatological: negative for rash Respiratory: negative for cough or wheezing Urologic: negative for hematuria Abdominal: negative for nausea, vomiting, diarrhea, bright red blood per rectum, melena, or hematemesis Neurologic: negative for visual changes, syncope, or dizziness All other systems reviewed and are otherwise negative except as noted above.  Physical Exam: Blood pressure 163/36, pulse 63, temperature 97.7 F (36.5 C), temperature source Oral, resp. rate 18, SpO2 98.00%.  General appearance: alert, cooperative, no distress and morbidly obese Neck: no carotid bruit, no JVD and supple, symmetrical, trachea midline Lungs: clear to auscultation bilaterally Heart: regular rate and rhythm Abdomen: obese, non tender Extremities: no edema Pulses: 2+ and symmetric Skin: cool and dry Neurologic: Grossly normal    Labs:   Results for orders placed during the hospital encounter of 02/27/12 (from the past 24 hour(s))  CBC     Status: Abnormal   Collection Time   02/27/12  3:22 PM      Component Value Range   WBC 7.5  4.0 - 10.5 K/uL   RBC 3.97  3.87 - 5.11 MIL/uL   Hemoglobin 12.0  12.0 - 15.0 g/dL   HCT 16.1 (*) 09.6 - 04.5 %   MCV 87.7  78.0 - 100.0 fL   MCH 30.2  26.0 - 34.0 pg   MCHC 34.5  30.0 - 36.0 g/dL   RDW 40.9  81.1 - 91.4 %   Platelets 211  150 - 400 K/uL  BASIC METABOLIC PANEL     Status: Abnormal   Collection Time   02/27/12  3:22 PM      Component Value Range   Sodium 141  135 - 145 mEq/L   Potassium 2.5 (*) 3.5 - 5.1 mEq/L   Chloride 98  96 - 112 mEq/L   CO2 32  19 - 32 mEq/L    Glucose, Bld 112 (*) 70 - 99 mg/dL   BUN 67 (*) 6 - 23 mg/dL   Creatinine, Ser 7.82 (*) 0.50 - 1.10 mg/dL  Calcium 10.0  8.4 - 10.5 mg/dL   GFR calc non Af Amer 18 (*) >90 mL/min   GFR calc Af Amer 21 (*) >90 mL/min  PRO B NATRIURETIC PEPTIDE     Status: Abnormal   Collection Time   02/27/12  3:22 PM      Component Value Range   Pro B Natriuretic peptide (BNP) 1334.0 (*) 0 - 125 pg/mL  POCT I-STAT TROPONIN I     Status: Normal   Collection Time   02/27/12  4:02 PM      Component Value Range   Troponin i, poc 0.01  0.00 - 0.08 ng/mL   Comment 3              Radiology/Studies: Dg Chest 2 View  02/27/2012  *RADIOLOGY REPORT*  Clinical Data: Upper and left-sided chest pain since last night, left VATS for resection of left upper lobe lung nodule  CHEST - 2 VIEW  Comparison: Chest x-ray of 03/16/2011 and PET CT of 03/10/2010  Findings: Postoperative changes on the left are noted with volume loss and scarring as well as clips overlying the left hilum.  No active infiltrate or effusion is seen.  Left hemidiaphragm is slightly elevated.  Mild cardiomegaly is stable.  No acute bony abnormality is seen.  IMPRESSION:   Stable postop change on the left.  No active lung disease. Stable cardiomegaly   Original Report Authenticated By: Dwyane Dee, M.D.     EKG:NSR, PVCs, PACs  ASSESSMENT AND PLAN:  Principal Problem:  *Chest pain syndrome, S/P multiple admissions for chest pain Active Problems:  Hypokalemia, K+ 2.5 on admission  Palpitations  CAD, multiple caths. Last cath June 2012, no significant LAD ISR  Chronic renal disease, stage III  PAF, Amiodarone stopped by her cardiologist in [facility] Oct 2013  PVC's (premature ventricular contractions)  Syncope and collapse, Oct 2013- Dr Croitoru had planned a loop recorder for 02/29/12  Chronic diastolic CHF (congestive heart failure)  Hypertension associated with diabetes  Diabetes mellitus  Obesity, Class I, BMI 30-34.9  Allergy to intravenous  contrast- "swelling"  Dyslipidemia  Plan- Admit, r/o MI. Loop recorder Thursday. Replace K+. Hold Zaroxolyn for now.  Deland Pretty, PA-C 02/27/2012, 5:27 PM  Agree with note written by Corine Shelter PAC  Pt well known to our service She just saw Dr. Royann Shivers the day of admission in the office. She does have a h/o CAD as well as multiple other problems as outlined. She has had numerous admissions for CP/ROMI and caths revealing patent coronary arteries. She recently had an episode of syncope as well. She seems to shuttle between [place] and [place] and gets admitted to the hospital in both places. Dr. Salena Saner was planning to put in a loop recorder on Thurs however she presents now with CP. Exam is benign. EKG w/o acute changes. There are bigeminal PVCs. Labs OK except for CRI. Enz are neg. I doubt this is ischemically mediated CP but given her H/O CAD will admit, heparinize and R/O. Plan loop recorder implant tomorrow by Dr. Zachary George 02/28/2012 6:33 AM

## 2012-02-27 NOTE — ED Notes (Signed)
Potassium 2.5 ED MD made awre

## 2012-02-27 NOTE — ED Notes (Signed)
MD at bedside. 

## 2012-02-27 NOTE — Progress Notes (Signed)
ANTICOAGULATION CONSULT NOTE - Initial Consult  Pharmacy Consult for Heparin Indication: Chest Pain  Allergies  Allergen Reactions  . Contrast Media (Iodinated Diagnostic Agents) Swelling  . Blue Dyes (Parenteral) Swelling       . Erythromycin Palpitations    Patient Measurements: Pt. states weight is 192 pounds (~ 87 kg) Pt. States height is 5 foot 1 inch ( 61 inches) Heparin Dosing Weight: 61 kg  Vital Signs: Temp: 97.7 F (36.5 C) (11/19 1431) Temp src: Oral (11/19 1431) BP: 144/53 mmHg (11/19 1615) Pulse Rate: 58  (11/19 1615)  Labs:  [facility] 02/27/12 1522  HGB 12.0  HCT 34.8*  PLT 211  APTT --  LABPROT --  INR --  HEPARINUNFRC --  CREATININE 2.56*  CKTOTAL --  CKMB --  TROPONINI --   Medical History: Past Medical History  Diagnosis Date  . Palpitations   . History of angina   . HTN (hypertension)   . DM (diabetes mellitus)   . CAD (coronary artery disease)   . Bigeminy   . Obesity   . Renal failure   . Spinal headache   . PONV (postoperative nausea and vomiting)   . Adverse effect of anesthesia 05/2009    MI during cardiac  stent placement  . Angina   . Myocardial infarction 05/2009  . Heart murmur   . Asthma   . Shortness of breath     "w/exertion"  . Hypothyroidism     "started med 01/2011"  . Blood transfusion   . Anemia 02/14/11     "I have taken shots for it; I take iron pills now"  . CHF (congestive heart failure)   . Recurrent upper respiratory infection (URI)     "colds every year"  . Recurrent upper respiratory infection (URI)   . Pneumonia     last time "winter 2011"  . Hiatal hernia   . Depression      "lost son 03/2010"  . GERD (gastroesophageal reflux disease)   . Arthritis     Medications:  clopidogrel (PLAVIX)  insulin glargine (LANTUS) aspirin 81 MG isosorbide mononitrate (IMDUR) 60 MG 24 hr omeprazole (PRILOSEC) 20 MG  furosemide (LASIX) 80 MG tablet  Bimatoprost (LUMIGAN) 0.01 % SOLN  traMADol (ULTRAM) 50 MG  tablet  ferrous sulfate 325 (65 FE) MG  albuterol (PROVENTIL HFA;VENTOLIN HFA) 108 (90 BASE) MCG/ACT inhaler nitroGLYCERIN (NITROSTAT) 0.4 MG SL tablet nsulin lispro (HUMALOG) 100 UNIT/ML injection potassium chloride SA (K-DUR,KLOR-CON) 20 MEQ tablet  Fluticasone-Salmeterol (ADVAIR) 500-50 MCG/DOSE AEPB  levothyroxine (SYNTHROID, LEVOTHROID) 100 MCG meclizine (ANTIVERT) 25 MG tablet  metolazone (ZAROXOLYN) 5 MG tablet  NIFEdipine (PROCARDIA XL/ADALAT-CC) 60 MG 24 hr tablet  ondansetron (ZOFRAN-ODT) 8 MG disintegrating tablet  hydrALAZINE (APRESOLINE) 100 MG  cyclobenzaprine (FLEXERIL) 5 MG tablet   Assessment: 66yo female admitted with chest pain.  She has a past medical history significant for multiple ED visits due to chest pain.  Her EKG revealed PVC's, and PAC's.  Her initial troponin has been negative.  She has had PCI with stent placement on more than one occasion.  She was to have a planned loop recorder by Dr. Royann Shivers on 11/21.  We will initiate Heparin therapy with ongoing chest pain and monitor until therapy discontinued.  I spoke with patient and she denies any recent bleeding episodes.  She has anemia of chronic disease with chronic renal dysfunction.  Goal of Therapy:  Heparin level 0.3-0.7 units/ml Monitor platelets by anticoagulation protocol: Yes   Plan:  1.  Heparin 4000 units IV bolus x 1  2.  Heparin infusion at 1000 units/hr 3.  Obtain a heparin level 6 hours after starting and then daily. 4.  Monitor s/s of bleeding complications.  Nadara Mustard, PharmD., MS Clinical Pharmacist Pager:  [phone] Thank you for allowing pharmacy to be part of this patients care team. 02/27/2012,7:02 PM

## 2012-02-27 NOTE — ED Notes (Signed)
Dinner tray ordered.

## 2012-02-27 NOTE — ED Notes (Signed)
Pt is here with chest pain that started last nite and went to cardiologist this am.  Pt is scheduled to have a defibrillator for PVCs adn then will get a pacer per patient.  Pt is in bigeminy in triage.  Pt sob and denies fluid weight

## 2012-02-27 NOTE — Progress Notes (Signed)
CRITICAL VALUE ALERT  Critical value received: yes  Date of notification:  02/27/12  Time of notification:  2116  Critical value read back:yes  Nurse who received alert:  Shelbie Ammons, RN   MD notified (1st page):  Corine Shelter, PA-C  Time of first page:  2124  MD notified (2nd page):  Time of second page:  Responding MD:  Corine Shelter, PA-C  Time MD responded:  2125

## 2012-02-27 NOTE — ED Provider Notes (Addendum)
History     CSN: [id number]  Arrival date & time 02/27/12  1417   First MD Initiated Contact with Patient 02/27/12 1539      Chief Complaint  Patient presents with  . Chest Pain    (Consider location/radiation/quality/duration/timing/severity/associated sxs/prior treatment) Patient is a 66 y.o. female presenting with chest pain. The history is provided by the patient.  Chest Pain    patient here with atypical chest pain that started last night at rest and was relieved after patient took nitroglycerin, Ultram and Tylenol. Symptoms lasted for approximately 20 minutes and were associated with dyspnea. Patient saw her cardiologist today for preoperative clearance for a defibrillator that is to be inserted into days. She had no symptoms there. Upon leaving her cardiologist office, she once again had substernal chest pain radiating to both arms associated with dyspnea. No diaphoresis. No recent fever or cough. Symptoms resolved spontaneously. She denies any recent travel history, leg pain or swelling  Past Medical History  Diagnosis Date  . Palpitations   . History of angina   . HTN (hypertension)   . DM (diabetes mellitus)   . CAD (coronary artery disease)   . Bigeminy   . Obesity   . Renal failure   . Spinal headache   . PONV (postoperative nausea and vomiting)   . Adverse effect of anesthesia 05/2009    MI during cardiac  stent placement  . Angina   . Myocardial infarction 05/2009  . Heart murmur   . Asthma   . Shortness of breath     "w/exertion"  . Hypothyroidism     "started med 01/2011"  . Blood transfusion   . Anemia 02/14/11     "I have taken shots for it; I take iron pills now"  . CHF (congestive heart failure)   . Recurrent upper respiratory infection (URI)     "colds every year"  . Recurrent upper respiratory infection (URI)   . Pneumonia     last time "winter 2011"  . Hiatal hernia   . Depression      "lost son 03/2010"  . GERD (gastroesophageal reflux  disease)   . Arthritis     Past Surgical History  Procedure Date  . Cardiac stents. 2010; 05/2009  . Lt vats jan 2012   . Tubal ligation   . Cardiac catheterization     last stent 05/2009; "had MI during this"  . Coronary angioplasty with stent placement 05/2009  . Coronary angioplasty with stent placement 2010  . Abdominal hysterectomy ~ 1986  . Diagnostic laparoscopy post 1986    ovaries removed   . Eye surgery ~ 2009    laser tx   . Fracture surgery 2002    right ankle  . Dilation and curettage of uterus     Family History  Problem Relation Age of Onset  . Cancer      History  Substance Use Topics  . Smoking status: Former Smoker    Quit date: 03/10/1998  . Smokeless tobacco: Never Used  . Alcohol Use: No     Comment: "quit early 1990's"    OB History    Grav Para Term Preterm Abortions TAB SAB Ect Mult Living                  Review of Systems  Cardiovascular: Positive for chest pain.  All other systems reviewed and are negative.    Allergies  Blue dyes (parenteral) and Erythromycin  Home Medications  Current Outpatient Rx  Name  Route  Sig  Dispense  Refill  . ALBUTEROL SULFATE HFA 108 (90 BASE) MCG/ACT IN AERS   Inhalation   Inhale 2 puffs into the lungs every 6 (six) hours as needed. For shortness of breath/wheezing          . AMIODARONE HCL 200 MG PO TABS   Oral   Take 100 mg by mouth every morning.           Marland Kitchen AMLODIPINE BESYLATE 10 MG PO TABS   Oral   Take 10 mg by mouth daily.           . ASPIRIN 81 MG PO TABS   Oral   Take 81 mg by mouth daily.           Marland Kitchen BIMATOPROST 0.01 % OP SOLN   Ophthalmic   Apply to eye at bedtime. One drop in each eye at bedtime         . BRIMONIDINE TARTRATE 0.15 % OP SOLN   Both Eyes   Place 1 drop into both eyes 2 (two) times daily.           Marland Kitchen CLOPIDOGREL BISULFATE 75 MG PO TABS   Oral   Take 75 mg by mouth every morning.          Marland Kitchen FERROUS SULFATE 325 (65 FE) MG PO TABS   Oral    Take 325 mg by mouth 2 (two) times daily.           Marland Kitchen FLUTICASONE-SALMETEROL 250-50 MCG/DOSE IN AEPB   Inhalation   Inhale 1 puff into the lungs every 12 (twelve) hours.           . FUROSEMIDE 80 MG PO TABS   Oral   Take 40-80 mg by mouth 2 (two) times daily. 1 take in the am, 0.5 half tab in the pm         . HYDRALAZINE HCL 50 MG PO TABS      50 mg 3 (three) times daily. 2 tabs (100mg ) in the am, 1.5 tabs (75mg ) in the evening about 5pm, 1.5 tabs (75mg ) at 12am         . INSULIN GLARGINE 100 UNIT/ML [place] SOLN   Subcutaneous   Inject 25 Units into the skin daily after breakfast.          . INSULIN LISPRO (HUMAN) 100 UNIT/ML [place] SOLN   Subcutaneous   Inject 4-5 Units into the skin 3 (three) times daily between meals as needed. Pt is on a sliding scale          . ISOSORBIDE MONONITRATE ER 60 MG PO TB24   Oral   Take 30 mg by mouth every morning.          Marland Kitchen LEVOTHYROXINE SODIUM 75 MCG PO TABS   Oral   Take 37.5 mcg by mouth every morning.           Marland Kitchen LORAZEPAM 0.5 MG PO TABS   Oral   Take 0.5 mg by mouth every 8 (eight) hours as needed. For anxiety/sleep          . NITROGLYCERIN 0.4 MG SL SUBL   Sublingual   Place 0.4 mg under the tongue every 5 (five) minutes as needed. For chest pain          . OMEPRAZOLE 20 MG PO CPDR   Oral   Take 20 mg by mouth every morning.          Marland Kitchen  PARICALCITOL 1 MCG PO CAPS   Oral   Take 1 mcg by mouth daily.           Marland Kitchen POTASSIUM CHLORIDE CRYS ER 20 MEQ PO TBCR   Oral   Take 1 tablet (20 mEq total) by mouth 2 (two) times daily.   60 tablet   5   . ROSUVASTATIN CALCIUM 10 MG PO TABS   Oral   Take 10 mg by mouth daily.           Marland Kitchen TEMAZEPAM 30 MG PO CAPS   Oral   Take 30 mg by mouth at bedtime as needed.           Marland Kitchen TRAMADOL HCL 50 MG PO TABS   Oral   Take 50 mg by mouth every 6 (six) hours as needed. Maximum dose= 8 tablets per day, for pain            BP 163/36  Pulse 63  Temp 97.7 F (36.5 C)  (Oral)  Resp 18  SpO2 98%  Physical Exam  Nursing note and vitals reviewed. Constitutional: She is oriented to person, place, and time. She appears well-developed and well-nourished.  Non-toxic appearance. No distress.  HENT:  Head: Normocephalic and atraumatic.  Eyes: Conjunctivae normal, EOM and lids are normal. Pupils are equal, round, and reactive to light.  Neck: Normal range of motion. Neck supple. No tracheal deviation present. No mass present.  Cardiovascular: Normal rate and normal heart sounds.  An irregular rhythm present. Exam reveals no gallop.   No murmur heard. Pulmonary/Chest: Effort normal and breath sounds normal. No stridor. No respiratory distress. She has no decreased breath sounds. She has no wheezes. She has no rhonchi. She has no rales.  Abdominal: Soft. Normal appearance and bowel sounds are normal. She exhibits no distension. There is no tenderness. There is no rebound and no CVA tenderness.  Musculoskeletal: Normal range of motion. She exhibits no edema and no tenderness.  Neurological: She is alert and oriented to person, place, and time. She has normal strength. No cranial nerve deficit or sensory deficit. GCS eye subscore is 4. GCS verbal subscore is 5. GCS motor subscore is 6.  Skin: Skin is warm and dry. No abrasion and no rash noted.  Psychiatric: She has a normal mood and affect. Her speech is normal and behavior is normal.    ED Course  Procedures (including critical care time)  Labs Reviewed  CBC - Abnormal; Notable for the following:    HCT 34.8 (*)     All other components within normal limits  BASIC METABOLIC PANEL  PRO B NATRIURETIC PEPTIDE   Dg Chest 2 View  02/27/2012  *RADIOLOGY REPORT*  Clinical Data: Upper and left-sided chest pain since last night, left VATS for resection of left upper lobe lung nodule  CHEST - 2 VIEW  Comparison: Chest x-ray of 03/16/2011 and PET CT of 03/10/2010  Findings: Postoperative changes on the left are noted  with volume loss and scarring as well as clips overlying the left hilum.  No active infiltrate or effusion is seen.  Left hemidiaphragm is slightly elevated.  Mild cardiomegaly is stable.  No acute bony abnormality is seen.  IMPRESSION:   Stable postop change on the left.  No active lung disease. Stable cardiomegaly   Original Report Authenticated By: Dwyane Dee, M.D.      No diagnosis found.    MDM   Date: 02/27/2012  Rate: 78  Rhythm: premature ventricular contractions (  PVC)  QRS Axis: normal  Intervals: normal  ST/T Wave abnormalities: nonspecific ST changes  Conduction Disutrbances:bigemeny  Narrative Interpretation:   Old EKG Reviewed: unchanged   Spoke with cardiology and they'll come and see the patient   4:44 PM Patient given potassium for his hypokalemia.    Toy Baker, MD 02/27/12 1612  Toy Baker, MD 02/27/12 1644  Toy Baker, MD 02/27/12 [id number]

## 2012-02-27 NOTE — ED Notes (Signed)
MD at bedside.- consulting 

## 2012-02-28 ENCOUNTER — Other Ambulatory Visit: Payer: Self-pay | Admitting: Cardiovascular Disease

## 2012-02-28 ENCOUNTER — Encounter (HOSPITAL_COMMUNITY): Payer: Self-pay | Admitting: Pharmacy Technician

## 2012-02-28 ENCOUNTER — Encounter (HOSPITAL_COMMUNITY): Payer: Self-pay | Admitting: General Practice

## 2012-02-28 LAB — BASIC METABOLIC PANEL
BUN: 70 mg/dL — ABNORMAL HIGH (ref 6–23)
BUN: 68 mg/dL — ABNORMAL HIGH (ref 6–23)
CO2: 29 mEq/L (ref 19–32)
CO2: 28 mEq/L (ref 19–32)
Calcium: 9.6 mg/dL (ref 8.4–10.5)
Calcium: 9.7 mg/dL (ref 8.4–10.5)
Chloride: 101 mEq/L (ref 96–112)
Chloride: 101 mEq/L (ref 96–112)
Creatinine, Ser: 2.7 mg/dL — ABNORMAL HIGH (ref 0.50–1.10)
Creatinine, Ser: 2.57 mg/dL — ABNORMAL HIGH (ref 0.50–1.10)
GFR calc Af Amer: 20 mL/min — ABNORMAL LOW (ref >90)
GFR calc Af Amer: 21 mL/min — ABNORMAL LOW (ref >90)
GFR calc non Af Amer: 17 mL/min — ABNORMAL LOW (ref >90)
GFR calc non Af Amer: 18 mL/min — ABNORMAL LOW (ref >90)
Glucose, Bld: 166 mg/dL — ABNORMAL HIGH (ref 70–99)
Glucose, Bld: 215 mg/dL — ABNORMAL HIGH (ref 70–99)
Potassium: 2.5 mEq/L — CL (ref 3.5–5.1)
Potassium: 3.4 mEq/L — ABNORMAL LOW (ref 3.5–5.1)
Sodium: 143 mEq/L (ref 135–145)
Sodium: 140 mEq/L (ref 135–145)

## 2012-02-28 LAB — MAGNESIUM: Magnesium: 2.1 mg/dL (ref 1.5–2.5)

## 2012-02-28 LAB — TROPONIN I
Troponin I: <0.3 ng/mL (ref <0.30)
Troponin I: <0.3 ng/mL (ref <0.30)

## 2012-02-28 LAB — HEMOGLOBIN A1C
Hgb A1c MFr Bld: 6.7 % — ABNORMAL HIGH (ref <5.7)
Mean Plasma Glucose: 146 mg/dL — ABNORMAL HIGH (ref <117)

## 2012-02-28 LAB — GLUCOSE, CAPILLARY
Glucose-Capillary: 213 mg/dL — ABNORMAL HIGH (ref 70–99)
Glucose-Capillary: 227 mg/dL — ABNORMAL HIGH (ref 70–99)
Glucose-Capillary: 119 mg/dL — ABNORMAL HIGH (ref 70–99)

## 2012-02-28 LAB — TSH: TSH: 3.183 u[IU]/mL (ref 0.350–4.500)

## 2012-02-28 MED ORDER — VERAPAMIL HCL ER 180 MG PO TBCR
180.0000 mg | EXTENDED_RELEASE_TABLET | Freq: Two times a day (BID) | ORAL | Status: DC
  Administered 2012-02-29 – 2012-03-02 (×6): 180 mg via ORAL
  Filled 2012-02-28 (×8): qty 1

## 2012-02-28 MED ORDER — POTASSIUM CHLORIDE CRYS ER 20 MEQ PO TBCR
40.0000 meq | EXTENDED_RELEASE_TABLET | Freq: Once | ORAL | Status: AC
  Administered 2012-02-28: 40 meq via ORAL

## 2012-02-28 MED ORDER — MAGNESIUM SULFATE 40 MG/ML IJ SOLN
2.0000 g | INTRAMUSCULAR | Status: AC
  Administered 2012-02-28: 2 g via INTRAVENOUS
  Filled 2012-02-28 (×2): qty 50

## 2012-02-28 MED ORDER — MAGNESIUM OXIDE 400 (241.3 MG) MG PO TABS
400.0000 mg | ORAL_TABLET | Freq: Two times a day (BID) | ORAL | Status: DC
  Filled 2012-02-28 (×2): qty 1

## 2012-02-28 MED FILL — Verapamil HCl Tab ER 180 MG: ORAL | Qty: 1 | Status: AC

## 2012-02-28 NOTE — Progress Notes (Signed)
[facility]  Subjective: Pt reports that she feels lightheaded at times. Denies feeling dizzy. Denies CP and SOB.   Objective: Vital signs in last 24 hours: Temp:  [97.7 F (36.5 C)-100 F (37.8 C)] 98.2 F (36.8 C) (11/20 0449) Pulse Rate:  [55-63] 58  (11/20 0605) Resp:  [18-20] 20  (11/20 0449) BP: (144-172)/(36-58) 159/55 mmHg (11/20 0605) SpO2:  [96 %-98 %] 98 % (11/20 0910) Weight:  [85.2 kg (187 lb 13.3 oz)-87.091 kg (192 lb)] 86.32 kg (190 lb 4.8 oz) (11/20 0449) Last BM Date: 02/27/12  Intake/Output from previous day: 11/19 0701 - 11/20 0700 In: 720 [P.O.:720] Out: 700 [Urine:700] Intake/Output this shift:    Medications Current Facility-Administered Medications  Medication Dose Route Frequency Provider Last Rate Last Dose  . acetaminophen (TYLENOL) tablet 650 mg  650 mg Oral Q4H PRN Abelino Derrick, PA      . albuterol (PROVENTIL HFA;VENTOLIN HFA) 108 (90 BASE) MCG/ACT inhaler 2 puff  2 puff Inhalation Q6H PRN Abelino Derrick, PA      . ALPRAZolam Prudy Feeler) tablet 0.25 mg  0.25 mg Oral TID PRN Abelino Derrick, PA   0.25 mg at 02/27/12 2344  . aspirin chewable tablet 81 mg  81 mg Oral Daily Mihai Croitoru, MD      . atorvastatin (LIPITOR) tablet 10 mg  10 mg Oral q1800 Luke K Kilroy, PA      . bimatoprost (LUMIGAN) 0.01 % ophthalmic solution 1 drop  1 drop Both Eyes QHS Luke K Kilroy, PA      . brimonidine (ALPHAGAN) 0.2 % ophthalmic solution 1 drop  1 drop Both Eyes BID Mihai Croitoru, MD      . clopidogrel (PLAVIX) tablet 75 mg  75 mg Oral [address] [place], [place]   75 mg at 02/28/12 1610  . cyclobenzaprine (FLEXERIL) tablet 5 mg  5 mg Oral TID PRN Abelino Derrick, PA      . ferrous sulfate tablet 325 mg  325 mg Oral BID Abelino Derrick, [place]      . furosemide (LASIX) tablet 40 mg  40 mg Oral Daily Eda Paschal [place], [place]      . heparin ADULT infusion 100 units/mL (25000 units/250 mL)  1,000 Units/hr Intravenous Continuous Chinita Greenland, PHARMD 10  mL/hr at 02/27/12 2346 1,000 Units/hr at 02/27/12 2346  . [COMPLETED] heparin bolus via infusion 4,000 Units  4,000 Units Intravenous Once Chinita Greenland, PHARMD   4,000 Units at 02/27/12 2346  . hydrALAZINE (APRESOLINE) tablet 50 mg  50 mg Oral [address] [place], [place]   50 mg at 02/28/12 9604  . insulin aspart (novoLOG) injection 0-15 Units  0-15 Units Subcutaneous TID [facility] Eda Paschal [place], [place]   3 Units at 02/28/12 0758  . insulin aspart (novoLOG) injection 0-5 Units  0-5 Units Subcutaneous QHS Eda Paschal [place], [place]      . insulin glargine (LANTUS) injection 25 Units  25 Units Subcutaneous QPC breakfast Eda Paschal [place], [place]      . levothyroxine (SYNTHROID, LEVOTHROID) tablet 100 mcg  100 mcg Oral QAC breakfast Eda Paschal [place], [place]   100 mcg at 02/28/12 [phone]  . magnesium oxide (MAG-OX) tablet 400 mg  400 mg Oral BID Wilburt Finlay, PA      . meclizine (ANTIVERT) tablet 25 mg  25 mg Oral TID PRN Abelino Derrick, PA      . mometasone-formoterol (DULERA) 200-5 MCG/ACT inhaler 2 puff  2 puff Inhalation BID Eda Paschal [place], [place]   2 puff at 02/28/12 0910  . morphine 2 MG/ML injection 2 mg  2 mg Intravenous Q3H PRN Lennette Bihari, MD   2 mg at 02/27/12 2359   Or  . morphine 4 MG/ML injection 4 mg  4 mg Intravenous Q3H PRN Lennette Bihari, MD      . nitroGLYCERIN 0.2 mg/mL in dextrose 5 % infusion  2-200 mcg/min Intravenous Titrated Lennette Bihari, MD 1.5 mL/hr at 02/28/12 0717 5 mcg/min at 02/28/12 0717  . ondansetron (ZOFRAN) injection 4 mg  4 mg Intravenous Q6H PRN Abelino Derrick, PA      . pantoprazole (PROTONIX) EC tablet 40 mg  40 mg Oral Daily Luke K [place], [place]      . potassium chloride SA (K-DUR,KLOR-CON) CR tablet 20 mEq  20 mEq Oral Daily Eda Paschal [place], [place]      . [COMPLETED] potassium chloride SA (K-DUR,KLOR-CON) CR tablet 40 mEq  40 mEq Oral Once Margie Billet, MD   40 mEq at 02/27/12 1622  . [COMPLETED] potassium chloride SA (K-DUR,KLOR-CON) CR tablet 40 mEq  40 mEq Oral Once Abelino Derrick, [place]   40 mEq at 02/27/12  2342   And  . [COMPLETED] potassium chloride SA (K-DUR,KLOR-CON) CR tablet 40 mEq  40 mEq Oral Once Abelino Derrick, [place]   40 mEq at 02/28/12 0326  . potassium chloride SA (K-DUR,KLOR-CON) CR tablet 40 mEq  40 mEq Oral Once Wilburt Finlay, PA      . potassium chloride SA (K-DUR,KLOR-CON) CR tablet 40 mEq  40 mEq Oral Once Wilburt Finlay, PA      . traMADol (ULTRAM) tablet 50 mg  50 mg Oral Q6H PRN Abelino Derrick, PA      . verapamil (COVERA HS) 24 hr tablet 180 mg  180 mg Oral BID Abelino Derrick, PA   180 mg at 02/28/12 0552  . zolpidem (AMBIEN) tablet 5 mg  5 mg Oral QHS PRN Abelino Derrick, PA   5 mg at 02/27/12 2344  . [DISCONTINUED] aspirin tablet 81 mg  81 mg Oral Daily Eda Paschal [place], [place]      . [DISCONTINUED] aspirin tablet 81 mg  81 mg Oral Daily Mihai Croitoru, MD      . [DISCONTINUED] brimonidine (ALPHAGAN) 0.15 % ophthalmic solution 1 drop  1 drop Both Eyes BID Abelino Derrick, [place]      . [DISCONTINUED] nitroGLYCERIN (NITROGLYN) 2 % ointment 1 inch  1 inch Topical Q6H Toy Baker, MD   1 inch at 02/27/12 1625  . [DISCONTINUED] potassium chloride SA (K-DUR,KLOR-CON) CR tablet 40 mEq  40 mEq Oral Once Abelino Derrick, [place]      . [DISCONTINUED] potassium chloride SA (K-DUR,KLOR-CON) CR tablet 40 mEq  40 mEq Oral Once Abelino Derrick, [place]        PE: General appearance: alert, cooperative and no distress Lungs: clear to auscultation bilaterally Heart: regular rate and rhythm and 2/6 murmur Extremities: no LEE Pulses: 2+ and symmetric radials, 2+ right DP and 0 Left dp and 1+ left pt Skin: warm and dry Neurologic: Grossly normal  Lab Results:   Basename 02/27/12 1522  WBC 7.5  HGB 12.0  HCT 34.8*  PLT 211   BMET  Basename 02/28/12 0212 02/27/12 1946 02/27/12 1522  NA 143 -- 141  K 2.5* 2.6* 2.5*  CL 101 -- 98  CO2 29 -- 32  GLUCOSE 166* --  112*  BUN 70* -- 67*  CREATININE 2.70* -- 2.56*  CALCIUM 9.6 -- 10.0   Cardiac Panel (last 3 results)  Basename 02/28/12 0212 02/27/12 2017    CKTOTAL -- --  CKMB -- --  TROPONINI <0.30 <0.30  RELINDX -- --    Studies/Results:   Assessment/Plan  Principal Problem:  *Chest pain syndrome, S/P multiple admissions for chest pain Active Problems:  Palpitations  CAD, multiple caths. Last cath June 2012, no significant LAD ISR  Hypertension associated with diabetes  Diabetes mellitus  Chronic renal disease, stage III  PAF, Amiodarone stopped by her cardiologist in [facility] Oct 2013  Obesity, Class I, BMI 30-34.9  PVC's (premature ventricular contractions)  Syncope and collapse, Oct 2013- Dr Croitoru had planned a loop recorder for 02/29/12  Allergy to intravenous contrast- "swelling"  Dyslipidemia  Chronic diastolic CHF (congestive heart failure)  Hypokalemia, K+ 2.5 on admission  Plan: Telemetry shows period of nonsustained V-tach overnight. Rhythm strip shows what appears to be  Torsades. Qtc interval was 683 this AM. Stat Magnesium has been ordered. Pt started on 2 gm IV magnesium sulfate. Last K was 2.5. Will supplement with 40 mEq now and 40 again in 1300 hrs. Will recheck K at 1600 hrs. CP free. Troponin negative x 2. Scheduled for loop recorder tomorrow but may not need now. Order lipid panel for tomorrow. D/C PPI. Hold verapamil.  ? EP consult.   LOS: 1 day    Pedro Whiters 02/28/2012 9:40 AM   Mag 2.1.    Lorenia Hoston 10:54 AM

## 2012-02-28 NOTE — Progress Notes (Signed)
CRITICAL VALUE ALERT  Critical value received:  Potassium  Date of notification:  02/28/12  Time of notification:  0324  Critical value read back:yes  Nurse who received alert:  Shelbie Ammons, RN   MD notified (1st page):  Medication previously ordered with followup labs    Time of first page:    MD notified (2nd page):  Time of second page:  Responding MD:    Time MD responded:

## 2012-02-28 NOTE — Progress Notes (Signed)
UR Completed.  Sandrina Heaton Jane [phone] 02/28/2012  

## 2012-02-28 NOTE — Progress Notes (Signed)
ANTICOAGULATION CONSULT NOTE   Pharmacy Consult for Heparin Indication: Chest Pain  Allergies  Allergen Reactions  . Contrast Media (Iodinated Diagnostic Agents) Swelling  . Blue Dyes (Parenteral) Swelling       . Erythromycin Palpitations    Patient Measurements: Pt. states weight is 192 pounds (~ 87 kg) Pt. States height is 5 foot 1 inch ( 61 inches) Heparin Dosing Weight: 61 kg  Labs:  Basename 02/28/12 0213 02/28/12 0212 02/27/12 2017 02/27/12 1522  HGB -- -- -- 12.0  HCT -- -- -- 34.8*  PLT -- -- -- 211  APTT -- -- -- --  LABPROT -- -- -- --  INR -- -- -- --  HEPARINUNFRC 0.64 -- -- --  CREATININE -- 2.70* -- 2.56*  CKTOTAL -- -- -- --  CKMB -- -- -- --  TROPONINI -- <0.30 <0.30 --    Assessment: 66 yo female with chest pain for Heparin  Goal of Therapy:  Heparin level 0.3-0.7 units/ml Monitor platelets by anticoagulation protocol: Yes   Plan:  Continue Heparin at current rate  F/U am level  Geannie Risen, PharmD, BCPS 02/28/2012,3:38 AM

## 2012-02-28 NOTE — Progress Notes (Signed)
Pt. Seen and examined. Agree with the NP/PA-C note as written. Frequent ectopy and polymorphic VT was noted overnight. EKG shows very flat T waves, however, the QTc does appear lengthened, ?U wave. Hypokalemic today, will need aggressive repletion. Stop any QT prolonging agents. Will discuss with Dr. Salena Saner, ?need for recorder, may wish to proceed with AICD. She has been having chest pain, but had a recent low risk myoview.  Chrystie Nose, MD, [facility] Attending Cardiologist [facility]

## 2012-02-28 NOTE — Progress Notes (Signed)
ANTICOAGULATION CONSULT NOTE   Pharmacy Consult for Heparin Indication: Chest Pain  Allergies  Allergen Reactions  . Contrast Media (Iodinated Diagnostic Agents) Swelling  . Blue Dyes (Parenteral) Swelling       . Erythromycin Palpitations    Patient Measurements: Pt. states weight is 192 pounds (~ 87 kg) Pt. States height is 5 foot 1 inch ( 61 inches) Heparin Dosing Weight: 61 kg  Labs:  Basename 02/28/12 0930 02/28/12 0213 02/28/12 0212 02/27/12 2017 02/27/12 1522  HGB -- -- -- -- 12.0  HCT -- -- -- -- 34.8*  PLT -- -- -- -- 211  APTT -- -- -- -- --  LABPROT -- -- -- -- --  INR -- -- -- -- --  HEPARINUNFRC 0.40 0.64 -- -- --  CREATININE -- -- 2.70* -- 2.56*  CKTOTAL -- -- -- -- --  CKMB -- -- -- -- --  TROPONINI -- -- <0.30 <0.30 --    Assessment: 66 yo female with chest pain on heparin drip.  Troponin negative x 2.  Pt in Torsades de pointes - Qtc 638.  Scheduled to have loop recorder implanted 11/21.  Heparin level 0.64 at 0213 and 0.40 at 0930.  HL therapeutic. No bleeding reported.   Goal of Therapy:  Heparin level 0.3-0.7 units/ml Monitor platelets by anticoagulation protocol: Yes   Plan:  1. Continue heparin drip at 1000 units/hr 2. Daily HL and CBC while on heparin 3. Home med listed as Verapamil 24 hr 180 po BID - pt states she does take it BID - I called [facility] pharmacy -Rx for Verapamil ER 180 po BID filled 02/09/12 for first time - this is q12h formulation vs q24 hr formulation - I adjusted med hx and inpt orders according - verapamil to be held today per orders.  She got a dose of the 12 hr formulation this am at 0553 am.  Herby Abraham, Pharm.D. [phone] 02/28/2012 11:04 AM

## 2012-02-29 ENCOUNTER — Other Ambulatory Visit: Payer: Self-pay

## 2012-02-29 ENCOUNTER — Ambulatory Visit (HOSPITAL_COMMUNITY)
Admission: RE | Admit: 2012-02-29 | Payer: PRIVATE HEALTH INSURANCE | Source: Ambulatory Visit | Admitting: Cardiovascular Disease

## 2012-02-29 ENCOUNTER — Encounter (HOSPITAL_COMMUNITY)
Admission: EM | Disposition: A | Discharge status: Home / Self Care | Payer: Self-pay | Source: Home / Self Care | Attending: Cardiovascular Disease

## 2012-02-29 DIAGNOSIS — R9431 Abnormal electrocardiogram [ECG] [EKG]: Secondary | ICD-10-CM | POA: Diagnosis not present

## 2012-02-29 DIAGNOSIS — R55 Syncope and collapse: Secondary | ICD-10-CM

## 2012-02-29 DIAGNOSIS — I4581 Long QT syndrome: Secondary | ICD-10-CM

## 2012-02-29 HISTORY — PX: LEFT HEART CATHETERIZATION WITH CORONARY ANGIOGRAM: SHX5451

## 2012-02-29 LAB — GLUCOSE, CAPILLARY
Glucose-Capillary: 119 mg/dL — ABNORMAL HIGH (ref 70–99)
Glucose-Capillary: 127 mg/dL — ABNORMAL HIGH (ref 70–99)

## 2012-02-29 LAB — CBC
MCHC: 33.8 g/dL (ref 30.0–36.0)
Platelets: 172 10*3/uL (ref 150–400)
RDW: 14.8 % (ref 11.5–15.5)
WBC: 6.2 10*3/uL (ref 4.0–10.5)

## 2012-02-29 LAB — BASIC METABOLIC PANEL
BUN: 68 mg/dL — ABNORMAL HIGH (ref 6–23)
Creatinine, Ser: 2.38 mg/dL — ABNORMAL HIGH (ref 0.50–1.10)
GFR calc Af Amer: 23 mL/min — ABNORMAL LOW (ref >90)
GFR calc non Af Amer: 20 mL/min — ABNORMAL LOW (ref >90)

## 2012-02-29 LAB — HEPARIN LEVEL (UNFRACTIONATED): Heparin Unfractionated: 0.38 IU/mL (ref 0.30–0.70)

## 2012-02-29 LAB — POCT ACTIVATED CLOTTING TIME: Activated Clotting Time: 130 seconds

## 2012-02-29 SURGERY — LEFT HEART CATHETERIZATION WITH CORONARY ANGIOGRAM
Anesthesia: LOCAL

## 2012-02-29 MED ORDER — FENTANYL CITRATE 0.05 MG/ML IJ SOLN
INTRAMUSCULAR | Status: AC
  Filled 2012-02-29: qty 2

## 2012-02-29 MED ORDER — POTASSIUM CHLORIDE CRYS ER 20 MEQ PO TBCR
20.0000 meq | EXTENDED_RELEASE_TABLET | Freq: Two times a day (BID) | ORAL | Status: DC
  Administered 2012-02-29 – 2012-03-03 (×7): 20 meq via ORAL
  Filled 2012-02-29 (×7): qty 1

## 2012-02-29 MED ORDER — HEPARIN SODIUM (PORCINE) 1000 UNIT/ML IJ SOLN
INTRAMUSCULAR | Status: AC
  Filled 2012-02-29: qty 1

## 2012-02-29 MED ORDER — SODIUM CHLORIDE 0.9 % IV SOLN: INTRAVENOUS | Status: DC

## 2012-02-29 MED ORDER — CHLORHEXIDINE GLUCONATE 4 % EX LIQD
60.0000 mL | Freq: Once | CUTANEOUS | Status: DC
  Filled 2012-02-29: qty 60

## 2012-02-29 MED ORDER — SODIUM CHLORIDE 0.9 % IR SOLN
80.0000 mg | Status: DC
  Filled 2012-02-29: qty 2

## 2012-02-29 MED ORDER — VERAPAMIL HCL 2.5 MG/ML IV SOLN
INTRAVENOUS | Status: AC
  Filled 2012-02-29: qty 2

## 2012-02-29 MED ORDER — SODIUM CHLORIDE 0.9 % IR SOLN
80.0000 mg | Status: AC
  Administered 2012-03-01: 80 mg
  Filled 2012-02-29 (×3): qty 2

## 2012-02-29 MED ORDER — MIDAZOLAM HCL 2 MG/2ML IJ SOLN
INTRAMUSCULAR | Status: AC
  Filled 2012-02-29: qty 2

## 2012-02-29 MED ORDER — ACETAMINOPHEN 325 MG PO TABS: 650.0000 mg | ORAL_TABLET | ORAL | Status: DC | PRN

## 2012-02-29 MED ORDER — SODIUM CHLORIDE 0.9 % IJ SOLN: 3.0000 mL | INTRAMUSCULAR | Status: DC | PRN

## 2012-02-29 MED ORDER — LIDOCAINE HCL (PF) 1 % IJ SOLN
INTRAMUSCULAR | Status: AC
  Filled 2012-02-29: qty 30

## 2012-02-29 MED ORDER — SODIUM CHLORIDE 0.9 % IV SOLN: 1.0000 mL/kg/h | INTRAVENOUS | Status: AC

## 2012-02-29 MED ORDER — SODIUM CHLORIDE 0.9 % IJ SOLN: 3.0000 mL | Freq: Two times a day (BID) | INTRAMUSCULAR | Status: DC

## 2012-02-29 MED ORDER — SODIUM CHLORIDE 0.9 % IV SOLN: 250.0000 mL | INTRAVENOUS | Status: DC | PRN

## 2012-02-29 MED ORDER — CHLORHEXIDINE GLUCONATE 4 % EX LIQD
60.0000 mL | Freq: Once | CUTANEOUS | Status: AC
  Administered 2012-03-01: 4 via TOPICAL
  Filled 2012-02-29 (×2): qty 60

## 2012-02-29 MED ORDER — SODIUM CHLORIDE 0.9 % IV SOLN: 250.0000 mL | INTRAVENOUS | Status: DC

## 2012-02-29 MED ORDER — HEPARIN (PORCINE) IN NACL 100-0.45 UNIT/ML-% IJ SOLN
1100.0000 [IU]/h | INTRAMUSCULAR | Status: DC
  Administered 2012-02-29: 1000 [IU]/h via INTRAVENOUS
  Filled 2012-02-29 (×3): qty 250

## 2012-02-29 MED ORDER — CEFAZOLIN SODIUM-DEXTROSE 2-3 GM-% IV SOLR
2.0000 g | INTRAVENOUS | Status: AC
  Administered 2012-03-01: 2 g via INTRAVENOUS
  Filled 2012-02-29 (×2): qty 50

## 2012-02-29 MED ORDER — SODIUM CHLORIDE 0.9 % IJ SOLN
3.0000 mL | Freq: Two times a day (BID) | INTRAMUSCULAR | Status: DC
  Administered 2012-02-29 – 2012-03-03 (×6): 3 mL via INTRAVENOUS

## 2012-02-29 MED ORDER — ASPIRIN 81 MG PO CHEW
324.0000 mg | CHEWABLE_TABLET | ORAL | Status: AC
  Administered 2012-02-29: 324 mg via ORAL
  Filled 2012-02-29: qty 4

## 2012-02-29 MED ORDER — ONDANSETRON HCL 4 MG/2ML IJ SOLN: 4.0000 mg | Freq: Four times a day (QID) | INTRAMUSCULAR | Status: DC | PRN

## 2012-02-29 MED ORDER — HEPARIN (PORCINE) IN NACL 2-0.9 UNIT/ML-% IJ SOLN
INTRAMUSCULAR | Status: AC
  Filled 2012-02-29: qty 2000

## 2012-02-29 MED ORDER — CEFAZOLIN SODIUM-DEXTROSE 2-3 GM-% IV SOLR
2.0000 g | INTRAVENOUS | Status: DC
  Filled 2012-02-29: qty 50

## 2012-02-29 MED ORDER — NITROGLYCERIN 0.2 MG/ML ON CALL CATH LAB
INTRAVENOUS | Status: AC
  Filled 2012-02-29: qty 1

## 2012-02-29 NOTE — CV Procedure (Signed)
Wanda [name], Wanda [name] Female, 66 y.o., 1945-10-25  Location: [facility]  Bed: NONE  MRN: [id number]  CSN: [id number]  Admit Dt: 02/27/12   CARDIAC CATHETERIZATION REPORT   Procedures performed:  1. Left heart catheterization  2. Selective coronary angiography    Reason for procedure:  CAD Polymorphic VT/Torsades de Pointes Syncope  Procedure performed by: Thurmon Fair, MD, [facility]  Complications: none   Estimated blood loss: less than 5 mL   History:  66 year old with multiple recent episodes of presyncope and syncope, long QT interval and known CAD s/p previous PCI. During inpatient monitoring, an episode of self terminated symptomatic torsades de pointes was recorded. Saline rehydration was performed before the procedure due CKD stage III.   Consent: The risks, benefits, and details of the procedure were explained to the patient. Risks including death, MI, stroke, bleeding, limb ischemia, renal failure and allergy were described and accepted by the patient. Informed written consent was obtained prior to proceeding.  Technique: The patient was brought to the cardiac catheterization laboratory in the fasting state. He was prepped and draped in the usual sterile fashion. Local anesthesia with 1% lidocaine was administered to the right wrist. Multiple attempts at radial artery cannulation were unsuccessful. The previously prepped right groin area was then anesthetized. Using the modified Seldinger technique a 5 French right common femoral artery sheath was introduced without difficulty. Under fluoroscopic guidance, using 5 [place] JL4, JRcatheters, selective cannulation of the left coronary artery, right coronary artery and left ventricle were respectively performed. Several coronary angiograms in a variety of projections were recorded, as well as a left ventriculogram in the RAO projection. Left ventricular pressure and a pull back to the aorta were recorded. No immediate complications occurred.  At the end of the procedure, all catheters were removed. After the procedure, hemostasis will be achieved with manual pressure.  Contrast used: 40 mL Omnipaque Medications: Versed 4 mg and fentanyl 75 mcg IV  Angiographic Findings:  1. The left main coronary artery is free of significant atherosclerosis and bifurcates in the usual fashion into the left anterior descending artery and left circumflex coronary artery.  It also generates a very small ramus intermedius artery. 2. The left anterior descending artery is a large vessel that reaches the apex and generates two major diagonal branches, the second being much smaller than the first. There is evidence of mild luminal irregularities and mild calcification, with 20-30% stenosis in the proximal vessel. There is a widely patent stent in the mid vessel, with minimal restenosis. No hemodynamically meaningful stenoses are seen. 3. The left circumflex coronary artery is a small to medium-size vessel non dominant vessel that generates two major oblque marginal arteries. There is evidence of mild luminal irregularities and no  calcification. No hemodynamically meaningful stenoses are seen. 4. The right coronary artery is a large-size dominant vessel that generates a long posterior lateral ventricular system as well as the posterior descending artery. There is evidence of mild to moderate proximal vessel luminal irregularities and mild calcification. There is a 30-40% stenosis at the entry to the AV groove portion of the vessel, but no other hemodynamically meaningful stenoses are seen.  5. The left ventricle was not injected due to the presence of chronic renal insufficiency. There is no aortic valve stenosis by pullback. The left ventricular end-diastolic pressure is 18 mm Hg.    IMPRESSIONS:  No significant ischemia-producing coronary lesions. Patent LAD stent.  RECOMMENDATION:  EP consultation for long QT syndrome, syncope, torsades and probable  implantation of AICD for secondary prevention.  Thurmon Fair, MD, [facility] [phone] office [phone] pager 02/29/2012

## 2012-02-29 NOTE — Progress Notes (Signed)
ANTICOAGULATION CONSULT NOTE   Pharmacy Consult for Heparin Indication: Chest Pain  Allergies  Allergen Reactions  . Blue Dyes (Parenteral) Swelling       . Erythromycin Palpitations    Patient Measurements: Pt. states weight is 192 pounds (~ 87 kg) Pt. States height is 5 foot 1 inch ( 61 inches) Heparin Dosing Weight: 61 kg  Labs:  Basename 02/29/12 0435 02/28/12 0930 02/28/12 0929 02/28/12 0213 02/28/12 0212 02/27/12 2017 02/27/12 1522  HGB 11.1* -- -- -- -- -- 12.0  HCT 32.8* -- -- -- -- -- 34.8*  PLT 172 -- -- -- -- -- 211  APTT -- -- -- -- -- -- --  LABPROT 14.3 -- -- -- -- -- --  INR 1.13 -- -- -- -- -- --  HEPARINUNFRC 0.38 0.40 -- 0.64 -- -- --  CREATININE 2.38* 2.57* -- -- 2.70* -- --  CKTOTAL -- -- -- -- -- -- --  CKMB -- -- -- -- -- -- --  TROPONINI -- -- <0.30 -- <0.30 <0.30 --    Assessment: 66 yo female with chest pain on heparin drip.  Troponin negative x 2.  Pt in Torsades de pointes - Qtc 638.   Heparin level 0.64 at 0213 and 0.40 at 0930.  HL therapeutic. No bleeding reported. Heparin to be resumed 8 hours after sheath pulled today.  Goal of Therapy:  Heparin level 0.3-0.7 units/ml Monitor platelets by anticoagulation protocol: Yes   Plan:  1. Resume heparin drip at 1000 units/hr at 2315 tonight. 2. Daily HL and CBC while on heparin  Celedonio Miyamoto, PharmD, [facility] Clinical Pharmacist Pager [phone]  02/29/2012 3:57 PM

## 2012-02-29 NOTE — Care Management Note (Unsigned)
    Page 1 of 1   02/29/2012     2:35:40 PM   CARE MANAGEMENT NOTE 02/29/2012  Patient:  ARTICE, HOLOHAN   Account Number:  [id number]  Date Initiated:  02/29/2012  Documentation initiated by:  Deakon Frix  Subjective/Objective Assessment:   PT ADM WITH CHEST PAIN ON 02/27/12.  ? AICD PLACEMENT TODAY.  PTA, PT IS INDEPENDENT, LIVES WITH SISTER.  SHE AMBULATES WITH CANE.     Action/Plan:   MET WITH PT TO DISCUSS DC PLANS.  SISTER TO PROVIDE CARE AT DC. WILL FOLLOW FOR HOME NEEDS AS PT PROGRESSES.   Anticipated DC Date:  03/02/2012   Anticipated DC Plan:  HOME W HOME HEALTH SERVICES      DC Planning Services  CM consult      Choice offered to / List presented to:             Status of service:  In process, will continue to follow Medicare Important Message given?   (If response is "NO", the following Medicare IM given date fields will be blank) Date Medicare IM given:   Date Additional Medicare IM given:    Discharge Disposition:    Per UR Regulation:  Reviewed for med. necessity/level of care/duration of stay  If discussed at Long Length of Stay Meetings, dates discussed:    Comments:

## 2012-02-29 NOTE — Consult Note (Addendum)
ELECTROPHYSIOLOGY CONSULT NOTE    Patient ID: Wanda [name] MRN: [id number], DOB/AGE: 66-Mar-1947 66 y.o.  Admit date: 02/27/2012 Date of Consult: 02-29-2012  Primary Physician: Pearson Grippe, MD Primary Cardiologist: Dorita Sciara, MD  Reason for Consultation: Prolonged QT interval and torsades  HPI:  Wanda [name] is a 66 year old female with a history of coronary artery disease (s/p BMS to Dx in 2009, stent to LAD in 2011), hypertension, diabetes, ventriclar bigeminy, obesity, hypothyroidism and syncope.  She travels between [place] and [place] and is followed by Dr C while here.  She has had multiple episodes of recurrent chest pain and has had several hospitalizations.   Her ventricular bigeminy has been difficult to treat because of bradycardia.  With recurrent syncope, plans were for outpatient ILR, however the patient presented with recurrent chest pain before this was scheduled.  While in the hospital, she has had repeat catheterization which did not demonstrate any targets for revascularization.  LV gram was not done due to chronic renal insufficiency.    Telemetry has demonstrated sinus bradycardia, PVC's and an episode of torsades, in context of K 3.4 although repletion efforts were still ongoing.  Further review suggests one couplet.  She has had one episode of syncope.  This occurred a few weeks ago;  She awakened and found herself on the floor, the last recalled event was 45 min before.  She was hospitalized, but no comments were made regarding postassium  She was admitted because of dizzy spells, which were akin to the symptoms that accompanied the torsades event.  These however started to occur just the day of admission and never before  Family hx of syncope include granddaughter x2  Events not available  EP has been asked to evaluate for ICD placement for secondary prevention.  With relative bradycardia and difficulty in treating PVC's in the past, an atrial  lead has been asked to be considered.   She apparently has had functional bradycardia in the past and has not toelrated betablockers  Past Medical History  Diagnosis Date  . Palpitations   . History of angina   . HTN (hypertension)   . DM (diabetes mellitus)   . CAD (coronary artery disease)   . Bigeminy   . Obesity   . Renal failure   . Spinal headache   . PONV (postoperative nausea and vomiting)   . Adverse effect of anesthesia 05/2009    MI during cardiac  stent placement  . Angina   . Myocardial infarction 05/2009  . Heart murmur   . Asthma   . Shortness of breath     "w/exertion"  . Hypothyroidism     "started med 01/2011"  . Blood transfusion   . Anemia 02/14/11     "I have taken shots for it; I take iron pills now"  . CHF (congestive heart failure)   . Recurrent upper respiratory infection (URI)     "colds every year"  . Recurrent upper respiratory infection (URI)   . Pneumonia     last time "winter 2011"  . Hiatal hernia   . Depression      "lost son 03/2010"  . GERD (gastroesophageal reflux disease)   . Arthritis      Surgical History:  Past Surgical History  Procedure Date  . Cardiac stents. 2010; 05/2009  . Lt vats jan 2012   . Tubal ligation   . Cardiac catheterization     last stent 05/2009; "had MI during this"  .  Coronary angioplasty with stent placement 05/2009  . Coronary angioplasty with stent placement 2010  . Abdominal hysterectomy ~ 1986  . Diagnostic laparoscopy post 1986    ovaries removed   . Eye surgery ~ 2009    laser tx   . Fracture surgery 2002    right ankle  . Dilation and curettage of uterus      Prescriptions prior to admission  Medication Sig Dispense Refill  . albuterol (PROVENTIL HFA;VENTOLIN HFA) 108 (90 BASE) MCG/ACT inhaler Inhale 2 puffs into the lungs every 6 (six) hours as needed. For shortness of breath/wheezing       . aspirin 81 MG tablet Take 81 mg by mouth daily.        . Bimatoprost (LUMIGAN) 0.01 % SOLN Place  1 drop into both eyes at bedtime.       . brimonidine (ALPHAGAN) 0.15 % ophthalmic solution Place 1 drop into both eyes 2 (two) times daily.       . clopidogrel (PLAVIX) 75 MG tablet Take 75 mg by mouth every morning.       . cyclobenzaprine (FLEXERIL) 5 MG tablet Take 5 mg by mouth 3 (three) times daily as needed. For muscle spasms      . ferrous sulfate 325 (65 FE) MG tablet Take 325 mg by mouth 2 (two) times daily.        . Fluticasone-Salmeterol (ADVAIR) 500-50 MCG/DOSE AEPB Inhale 1 puff into the lungs every 12 (twelve) hours.      . furosemide (LASIX) 80 MG tablet Take 40-80 mg by mouth 2 (two) times daily. 1 take in the am, 0.5 half tab in the pm      . hydrALAZINE (APRESOLINE) 100 MG tablet Take 100 mg by mouth every 8 (eight) hours.      . insulin glargine (LANTUS) 100 UNIT/ML injection Inject 25 Units into the skin daily after breakfast.       . insulin lispro (HUMALOG) 100 UNIT/ML injection Inject 2-14 Units into the skin 3 (three) times daily between meals as needed. sliding scale-150-175=2 units, 176-200=4 units, 201-225=6 units, 226-250=8 units, 251-300=10 units, 301-350=12 units & >350=14 units & contact doctor      . isosorbide mononitrate (IMDUR) 60 MG 24 hr tablet Take 90 mg by mouth every morning.       Marland Kitchen levothyroxine (SYNTHROID, LEVOTHROID) 100 MCG tablet Take 100 mcg by mouth daily.      . meclizine (ANTIVERT) 25 MG tablet Take 25 mg by mouth 3 (three) times daily as needed.      . metolazone (ZAROXOLYN) 5 MG tablet Take 5 mg by mouth daily as needed. Only takes for a weight gain of 5lbs      . NIFEdipine (PROCARDIA XL/ADALAT-CC) 60 MG 24 hr tablet Take 60 mg by mouth every morning.      . nitroGLYCERIN (NITROSTAT) 0.4 MG SL tablet Place 0.4 mg under the tongue every 5 (five) minutes as needed. For chest pain       . omeprazole (PRILOSEC) 20 MG capsule Take 20 mg by mouth every morning.       . ondansetron (ZOFRAN-ODT) 8 MG disintegrating tablet Take 8 mg by mouth every 6 (six)  hours as needed.      . potassium chloride SA (K-DUR,KLOR-CON) 20 MEQ tablet Take 20 mEq by mouth daily.      . traMADol (ULTRAM) 50 MG tablet Take 50 mg by mouth every 6 (six) hours as needed. Maximum dose= 8 tablets per day,  for pain      . verapamil (CALAN-SR) 180 MG CR tablet Take 180 mg by mouth 2 (two) times daily.        Inpatient Medications:    . [facility] HOLD] aspirin  81 mg Oral Daily  . [facility] HOLD] atorvastatin  10 mg Oral q1800  . [MAR HOLD] bimatoprost  1 drop Both Eyes QHS  . [MAR HOLD] brimonidine  1 drop Both Eyes BID  . [facility] HOLD] clopidogrel  75 mg Oral BH-q7a  . [facility] HOLD] ferrous sulfate  325 mg Oral BID  . [facility] HOLD] furosemide  40 mg Oral Daily  . [MAR HOLD] hydrALAZINE  50 mg Oral Q8H  . [MAR HOLD] insulin aspart  0-15 Units Subcutaneous TID WC  . [MAR HOLD] insulin aspart  0-5 Units Subcutaneous QHS  . [MAR HOLD] insulin glargine  25 Units Subcutaneous QPC breakfast  . [MAR HOLD] levothyroxine  100 mcg Oral QAC breakfast    Allergies:  Allergies  Allergen Reactions  . Blue Dyes (Parenteral) Swelling       . Erythromycin Palpitations    History   Social History  . Marital Status: Divorced    Spouse Name: N/A    Number of Children: N/A  . Years of Education: N/A   Occupational History  . Not on file.   Social History Main Topics  . Smoking status: Former Smoker    Quit date: 03/10/1998  . Smokeless tobacco: Never Used  . Alcohol Use: No     Comment: "quit early 1990's"  . Drug Use: No  . Sexually Active: No   Other Topics Concern  . Not on file   Social History Narrative   The patient lives with her daughter in [place], also is  staying with her sister now in [place].  She does not smoke or drink  alcohol.        Review of Systems -NEG APART FROM PMM AND HPI   Family History  Problem Relation Age of Onset  . Cancer      BP 151/59  Pulse 54  Temp 97.5 F (36.4 C) (Oral)  Resp 18  Ht 5\' 1"  (1.549 m)  Wt 190 lb 7.6 oz (86.4 kg)   BMI 35.99 kg/m2  SpO2 99% Alert and oriented in no acute distress HENT- normal Eyes- EOMI, without scleral icterus Skin- warm and dry; without rashes LN-neg Neck- supple without thyromegaly, JVP-flat, carotids brisk and full without bruits Back-bed rest Lungs-clear to auscultation CV-Regular rate and rhythm, nl S1 and S2, no murmurs gallops or rubs, S4-absent Abd-soft with active bowel sounds; no midline pulsation or hepatomegaly Pulses-intact femoral and distal MKS-without gross deformity Neuro- Ax O, CN3-12 intact, grossly normal motor and sensory function Affect engaging   Labs:   Lab Results  Component Value Date   WBC 6.2 02/29/2012   HGB 11.1* 02/29/2012   HCT 32.8* 02/29/2012   MCV 90.4 02/29/2012   PLT 172 02/29/2012    Lab 02/29/12 0435  NA 141  K 3.7  CL 103  CO2 27  BUN 68*  CREATININE 2.38*  CALCIUM 9.5  PROT --  BILITOT --  ALKPHOS --  ALT --  AST --  GLUCOSE 152*    Radiology/Studies: Dg Chest 2 View 02/27/2012  *RADIOLOGY REPORT*  Clinical Data: Upper and left-sided chest pain since last night, left VATS for resection of left upper lobe lung nodule  CHEST - 2 VIEW  Comparison: Chest x-ray of 03/16/2011 and PET CT of  03/10/2010  Findings: Postoperative changes on the left are noted with volume loss and scarring as well as clips overlying the left hilum.  No active infiltrate or effusion is seen.  Left hemidiaphragm is slightly elevated.  Mild cardiomegaly is stable.  No acute bony abnormality is seen.  IMPRESSION:   Stable postop change on the left.  No active lung disease. Stable cardiomegaly   Original Report Authenticated By: Dwyane Dee, M.D.     ZOX:WRUEA rhythm, QTc .  EKG's reviewed from the past demonstrate QTc's as long at .   TELEMETRY: sinus rhythm with PVC's, one episode of long short associated polymorphic ventricular tachycardia   Patient Active Hospital Problem List:   Palpitations (02/14/2011)   CAD, multiple caths. Last  cath June 2012, no significant LAD ISR (02/14/2011)     Chronic renal disease, stage III (02/14/2011)   PAF, Amiodarone stopped by her cardiologist in [facility] Oct 2013 (02/14/2011)  PVC's (premature ventricular contractions) (02/27/2012)   Syncope and collapse, Oct 2013- Dr Croitoru had planned a loop recorder for 02/29/12 (02/27/2012)   Hypokalemia,    Long QT  Assessment and plan   The patient has evidence of long QT on multiple electrocardiograms dating back a number of years. The pattern would suggest LQT 1.  She has a single episode of polymorphic ventricular tachycardia occurring in the setting of repeating hypokalemia that was quite profound. Significance of this is not clear to me. There was a suggestion of a couplet on telemetry overnight with a normal potassium; this is also worrisome for QT associated arrhythmia.  The syncopal episode does not suggest an arrhythmic event. She was apparently unconscious for some period of time. Medications and electrolytes are not available from that hospitalization yet although we will attempt to get them.  The fact that there have been no events prior to date speak against long QT syndrome; while she has had QT prolongation in hospital, this has occurred in the setting of hypokalemia.  I think there is out you in making a diagnosis given the suggestions as outlined above. 2 strategies present themselves as complementary. The first, given the fact that her ECG looks most like LQT1  epinephrine infusion is quite sensitive in this cohort.  There is differences of opinion regarding the role of ICD here especially given the fact that her potassium was 3.4 the time of her polymorphic ventricular tachycardia episode. However, I think beta blockers are the primary form of therapy. Although she has not tolerated them  in the past, there multitude of beta blockers worth trying.  I have reviewed this with Dr. Salena Saner. and Dr. Leonia Reeves

## 2012-02-29 NOTE — Progress Notes (Signed)
ANTICOAGULATION CONSULT NOTE   Pharmacy Consult for Heparin Indication: Chest Pain  Allergies  Allergen Reactions  . Contrast Media (Iodinated Diagnostic Agents) Swelling  . Blue Dyes (Parenteral) Swelling       . Erythromycin Palpitations    Patient Measurements: Pt. states weight is 192 pounds (~ 87 kg) Pt. States height is 5 foot 1 inch ( 61 inches) Heparin Dosing Weight: 61 kg  Labs:  Basename 02/29/12 0435 02/28/12 0930 02/28/12 0929 02/28/12 0213 02/28/12 0212 02/27/12 2017 02/27/12 1522  HGB 11.1* -- -- -- -- -- 12.0  HCT 32.8* -- -- -- -- -- 34.8*  PLT 172 -- -- -- -- -- 211  APTT -- -- -- -- -- -- --  LABPROT 14.3 -- -- -- -- -- --  INR 1.13 -- -- -- -- -- --  HEPARINUNFRC 0.38 0.40 -- 0.64 -- -- --  CREATININE 2.38* 2.57* -- -- 2.70* -- --  CKTOTAL -- -- -- -- -- -- --  CKMB -- -- -- -- -- -- --  TROPONINI -- -- <0.30 -- <0.30 <0.30 --    Assessment: 66 yo female with chest pain on heparin drip.  Troponin negative x 3.  Yesterday she was in [place] - Qtc 638- possibly electrolyte related.  Now in sinsus brady .  Previously scheduled to have loop recorder implanted 11/21. Now she is to have cardiac cath today to see if she has any significant coronary stenosis. If she does not, then a AICD will be considered.  Her heparin level is 0.38 today on 1000 units/hr.  INR 1.13 Hg 11.1 from 12, no bleeding reported.    Goal of Therapy:  Heparin level 0.3-0.7 units/ml Monitor platelets by anticoagulation protocol: Yes   Plan:  1. Continue heparin drip at 1000 units/hr 2. Daily HL and CBC while on heparin 3. F/u after cath today Herby Abraham, Pharm.D. [phone] 02/29/2012 11:02 AM

## 2012-02-29 NOTE — Progress Notes (Signed)
[facility]  Subjective: Feeling better.  No episodes of dizziness in the last 24 hours.    Objective: Vital signs in last 24 hours: Temp:  [97 F (36.1 C)-98.6 F (37 C)] 97.6 F (36.4 C) (11/21 0447) Pulse Rate:  [53-58] 56  (11/21 0447) Resp:  [18-19] 19  (11/21 0447) BP: (123-159)/(54-61) 159/61 mmHg (11/21 0447) SpO2:  [97 %-100 %] 98 % (11/21 0746) Weight:  [86.4 kg (190 lb 7.6 oz)] 86.4 kg (190 lb 7.6 oz) (11/21 0447) Last BM Date: 02/27/12  Intake/Output from previous day: 11/20 0701 - 11/21 0700 In: 779.1 [P.O.:480; I.V.:299.1] Out: 1800 [Urine:1800] Intake/Output this shift:    Medications Current Facility-Administered Medications  Medication Dose Route Frequency Provider Last Rate Last Dose  . acetaminophen (TYLENOL) tablet 650 mg  650 mg Oral Q4H PRN Abelino Derrick, PA      . albuterol (PROVENTIL HFA;VENTOLIN HFA) 108 (90 BASE) MCG/ACT inhaler 2 puff  2 puff Inhalation Q6H PRN Abelino Derrick, PA      . ALPRAZolam Prudy Feeler) tablet 0.25 mg  0.25 mg Oral TID PRN Abelino Derrick, PA   0.25 mg at 02/27/12 2344  . aspirin chewable tablet 81 mg  81 mg Oral Daily Aleeah Greeno, MD   81 mg at 02/28/12 1210  . atorvastatin (LIPITOR) tablet 10 mg  10 mg Oral q1800 Eda Paschal [place], PA   10 mg at 02/28/12 1701  . bimatoprost (LUMIGAN) 0.01 % ophthalmic solution 1 drop  1 drop Both Eyes QHS Eda Paschal [place], [place]   1 drop at 02/28/12 2230  . brimonidine (ALPHAGAN) 0.2 % ophthalmic solution 1 drop  1 drop Both Eyes BID Thurmon Fair, MD   1 drop at 02/28/12 2231  . clopidogrel (PLAVIX) tablet 75 mg  75 mg Oral [address] [place], [place]   75 mg at 02/29/12 4696  . cyclobenzaprine (FLEXERIL) tablet 5 mg  5 mg Oral TID PRN Abelino Derrick, PA      . ferrous sulfate tablet 325 mg  325 mg Oral BID Eda Paschal [place], [place]   325 mg at 02/28/12 2231  . furosemide (LASIX) tablet 40 mg  40 mg Oral Daily Eda Paschal [place], [place]   40 mg at 02/28/12 1110  . heparin ADULT infusion 100  units/mL (25000 units/250 mL)  1,000 Units/hr Intravenous Continuous Chinita Greenland, PHARMD 10 mL/hr at 02/27/12 2346 1,000 Units/hr at 02/27/12 2346  . hydrALAZINE (APRESOLINE) tablet 50 mg  50 mg Oral [address] [place], [place]   50 mg at 02/29/12 0649  . insulin aspart (novoLOG) injection 0-15 Units  0-15 Units Subcutaneous TID WC Abelino Derrick, PA   2 Units at 02/29/12 0700  . insulin aspart (novoLOG) injection 0-5 Units  0-5 Units Subcutaneous QHS Eda Paschal [place], [place]      . insulin glargine (LANTUS) injection 25 Units  25 Units Subcutaneous QPC breakfast Eda Paschal [place], [place]   25 Units at 02/28/12 1109  . levothyroxine (SYNTHROID, LEVOTHROID) tablet 100 mcg  100 mcg Oral QAC breakfast Eda Paschal [place], [place]   100 mcg at 02/29/12 [phone]  . [COMPLETED] magnesium sulfate IVPB 2 g 50 mL  2 g Intravenous STAT Wilburt Finlay, PA   2 g at 02/28/12 1100  . meclizine (ANTIVERT) tablet 25 mg  25 mg Oral TID PRN Abelino Derrick, PA      . mometasone-formoterol (DULERA) 200-5 MCG/ACT inhaler 2 puff  2 puff Inhalation  BID Abelino Derrick, [place]   2 puff at 02/29/12 [phone]  . morphine 2 MG/ML injection 2 mg  2 mg Intravenous Q3H PRN Lennette Bihari, MD   2 mg at 02/27/12 2359   Or  . morphine 4 MG/ML injection 4 mg  4 mg Intravenous Q3H PRN Lennette Bihari, MD      . nitroGLYCERIN 0.2 mg/mL in dextrose 5 % infusion  2-200 mcg/min Intravenous Titrated Lennette Bihari, MD 1.5 mL/hr at 02/28/12 0717 5 mcg/min at 02/28/12 0717  . ondansetron (ZOFRAN) injection 4 mg  4 mg Intravenous Q6H PRN Abelino Derrick, PA      . potassium chloride SA (K-DUR,KLOR-CON) CR tablet 20 mEq  20 mEq Oral Daily Eda Paschal [place], [place]   20 mEq at 02/28/12 0943  . [COMPLETED] potassium chloride SA (K-DUR,KLOR-CON) CR tablet 40 mEq  40 mEq Oral Once Wilburt Finlay, PA   40 mEq at 02/28/12 1211  . [COMPLETED] potassium chloride SA (K-DUR,KLOR-CON) CR tablet 40 mEq  40 mEq Oral Once Wilburt Finlay, PA   40 mEq at 02/28/12 0944  . traMADol (ULTRAM) tablet 50 mg  50 mg Oral Q6H  PRN Abelino Derrick, PA      . verapamil (CALAN-SR) CR tablet 180 mg  180 mg Oral BID Tniya Bowditch, MD      . zolpidem (AMBIEN) tablet 5 mg  5 mg Oral QHS PRN Abelino Derrick, PA   5 mg at 02/28/12 2232  . [DISCONTINUED] magnesium oxide (MAG-OX) tablet 400 mg  400 mg Oral BID Wilburt Finlay, PA      . [DISCONTINUED] pantoprazole (PROTONIX) EC tablet 40 mg  40 mg Oral Daily Eda Paschal [place], [place]      . [DISCONTINUED] verapamil (COVERA HS) 24 hr tablet 180 mg  180 mg Oral BID Wilburt Finlay, PA   180 mg at 02/28/12 9604    PE: General appearance: alert, cooperative and no distress  Lungs: clear to auscultation bilaterally  Heart: regular rate and rhythm and 1/6 murmur  Extremities: no LEE  Pulses: 2+ and symmetric radials, 2+ right DP and 0 Left dp and 1+ left pt  Skin: warm and dry  Neurologic: Grossly normal   Lab Results:   Basename 02/29/12 0435 02/27/12 1522  WBC 6.2 7.5  HGB 11.1* 12.0  HCT 32.8* 34.8*  PLT 172 211   BMET  Basename 02/29/12 0435 02/28/12 0930 02/28/12 0212  NA 141 140 143  K 3.7 3.4* 2.5*  CL 103 101 101  CO2 27 28 29   GLUCOSE 152* 215* 166*  BUN 68* 68* 70*  CREATININE 2.38* 2.57* 2.70*  CALCIUM 9.5 9.7 9.6   PT/INR  Basename 02/29/12 0435  LABPROT 14.3  INR 1.13    Assessment/Plan   Principal Problem:  *Chest pain syndrome, S/P multiple admissions for chest pain Active Problems:  Palpitations  CAD, multiple caths. Last cath June 2012, no significant LAD ISR  Hypertension associated with diabetes  Diabetes mellitus  Chronic renal disease, stage III  PAF, Amiodarone stopped by her cardiologist in [facility] Oct 2013  Obesity, Class I, BMI 30-34.9  PVC's (premature ventricular contractions)  Syncope and collapse, Oct 2013- Dr Trisha Morandi had planned a loop recorder for 02/29/12  Allergy to intravenous contrast- "swelling"  Dyslipidemia  Chronic diastolic CHF (congestive heart failure)  Hypokalemia, K+ 2.5 on admission  Plan:  Potassium improved to WNL.    SCr improved.   Repeat EKG this AM.  ICD today.  She is having episodes of bradycardia into the 40's at night.  Sinus brady on EKG.  The patient reported mouth swells up when eating shrimp, but she has had 3-4 heart caths with no contrast related reactions.   LOS: 2 days    HAGER, BRYAN 02/29/2012 7:57 AM   I have seen and examined the patient along with Wilburt Finlay, PA.  I have reviewed the chart, notes and new data.  I agree with PA's note.  Key new complaints: no new presyncope. Key examination changes: no signs of hypervolemia/CHF Key new findings / data: creat down to  ; no further TdP on monitor; QTC shortened to 480, Anterior T waves mostly normalized.  PLAN: Long QT on arrival may explain initial long QT, but K was 3.4 when she had TdP and presyncope. ECG changes may have been electrolyte  Related, but there is also suspicion for coronary insufficiency. Recommend coronary angiography, notwithstanding the risk of contrast nephrotoxicity. If a significant coronary stenosis is identified, it should be addressed with revascularization. If no acute coronary problem, AICD should be considered. She has not tolerated beta blockers in the past and has frequent symptoms due to effective bradycardia (ventricular bigeminy in 60swith peripheral pulse in 30s). She is not a candidate for either class I or class III antiarrhythmics. Will call EP consult if no need for PCI today. This procedure has been fully reviewed with the patient and written informed consent has been obtained.  Thurmon Fair, MD, [facility] [phone] 02/29/2012, 9:40 AM

## 2012-03-01 ENCOUNTER — Encounter (HOSPITAL_COMMUNITY)
Admission: EM | Disposition: A | Discharge status: Home / Self Care | Payer: Self-pay | Source: Home / Self Care | Attending: Cardiovascular Disease

## 2012-03-01 ENCOUNTER — Encounter (HOSPITAL_COMMUNITY): Payer: Self-pay | Admitting: Cardiology

## 2012-03-01 DIAGNOSIS — I472 Ventricular tachycardia: Secondary | ICD-10-CM

## 2012-03-01 DIAGNOSIS — I4721 Torsades de pointes: Secondary | ICD-10-CM

## 2012-03-01 DIAGNOSIS — R55 Syncope and collapse: Secondary | ICD-10-CM

## 2012-03-01 DIAGNOSIS — R079 Chest pain, unspecified: Secondary | ICD-10-CM

## 2012-03-01 HISTORY — PX: IMPLANTABLE CARDIOVERTER DEFIBRILLATOR IMPLANT: SHX5473

## 2012-03-01 HISTORY — DX: Ventricular tachycardia: I47.2

## 2012-03-01 HISTORY — PX: LOOP RECORDER IMPLANT: SHX5954

## 2012-03-01 HISTORY — DX: Torsades de pointes: I47.21

## 2012-03-01 LAB — CBC
HCT: 31.9 % — ABNORMAL LOW (ref 36.0–46.0)
MCV: 91.1 fL (ref 78.0–100.0)
Platelets: 179 10*3/uL (ref 150–400)
RBC: 3.5 MIL/uL — ABNORMAL LOW (ref 3.87–5.11)
WBC: 5.9 10*3/uL (ref 4.0–10.5)

## 2012-03-01 LAB — BASIC METABOLIC PANEL
BUN: 62 mg/dL — ABNORMAL HIGH (ref 6–23)
CO2: 27 mEq/L (ref 19–32)
Chloride: 100 mEq/L (ref 96–112)
Creatinine, Ser: 2.18 mg/dL — ABNORMAL HIGH (ref 0.50–1.10)
GFR calc Af Amer: 26 mL/min — ABNORMAL LOW (ref >90)
Potassium: 3.7 mEq/L (ref 3.5–5.1)

## 2012-03-01 LAB — GLUCOSE, CAPILLARY: Glucose-Capillary: 135 mg/dL — ABNORMAL HIGH (ref 70–99)

## 2012-03-01 LAB — HEPARIN LEVEL (UNFRACTIONATED): Heparin Unfractionated: 0.24 IU/mL — ABNORMAL LOW (ref 0.30–0.70)

## 2012-03-01 SURGERY — IMPLANTABLE CARDIOVERTER DEFIBRILLATOR IMPLANT
Anesthesia: LOCAL

## 2012-03-01 MED ORDER — ACETAMINOPHEN 325 MG PO TABS
325.0000 mg | ORAL_TABLET | ORAL | Status: DC | PRN
  Administered 2012-03-02 – 2012-03-03 (×3): 650 mg via ORAL
  Filled 2012-03-01 (×3): qty 2

## 2012-03-01 MED ORDER — LIDOCAINE HCL (PF) 1 % IJ SOLN
INTRAMUSCULAR | Status: AC
  Filled 2012-03-01: qty 60

## 2012-03-01 MED ORDER — MIDAZOLAM HCL 5 MG/5ML IJ SOLN
INTRAMUSCULAR | Status: AC
  Filled 2012-03-01: qty 5

## 2012-03-01 MED ORDER — EPINEPHRINE HCL 0.1 MG/ML IJ SOLN
INTRAMUSCULAR | Status: AC
  Filled 2012-03-01: qty 10

## 2012-03-01 MED ORDER — FENTANYL CITRATE 0.05 MG/ML IJ SOLN
INTRAMUSCULAR | Status: AC
  Filled 2012-03-01: qty 2

## 2012-03-01 MED ORDER — ISOSORBIDE MONONITRATE ER 60 MG PO TB24
60.0000 mg | ORAL_TABLET | Freq: Every day | ORAL | Status: DC
  Administered 2012-03-01 – 2012-03-03 (×3): 60 mg via ORAL
  Filled 2012-03-01 (×3): qty 1

## 2012-03-01 MED ORDER — ONDANSETRON HCL 4 MG/2ML IJ SOLN: 4.0000 mg | Freq: Four times a day (QID) | INTRAMUSCULAR | Status: DC | PRN

## 2012-03-01 MED ORDER — EPINEPHRINE HCL 1 MG/ML IJ SOLN
0.5000 ug/min | INTRAVENOUS | Status: DC
  Filled 2012-03-01: qty 1

## 2012-03-01 MED ORDER — DEXTROSE 5 % IV SOLN
INTRAVENOUS | Status: AC
  Filled 2012-03-01: qty 250

## 2012-03-01 NOTE — Progress Notes (Signed)
   ELECTROPHYSIOLOGY ROUNDING NOTE    Patient Name: Wanda [name] Date of Encounter: 03-01-2012    SUBJECTIVE:Patient feels well.  No chest pain or shortness of breath.  For epinephrine infusion and ILR implantation today to further define diagnosis.  TELEMETRY: Reviewed telemetry pt in sinus rhythm with occasional PVC's.  No couplets or NSVT Filed Vitals:   02/29/12 1846 02/29/12 1918 02/29/12 2009 03/01/12 0421  BP: 152/51  164/54 175/59  Pulse:   58 52  Temp:   98.5 F (36.9 C) 97.9 F (36.6 C)  TempSrc:   Oral Oral  Resp:   18 18  Height:      Weight:    185 lb 13.6 oz (84.3 kg)  SpO2:  98% 96% 99%    Intake/Output Summary (Last 24 hours) at 03/01/12 0700 Last data filed at 03/01/12 0436  Gross per 24 hour  Intake  690.9 ml  Output   3850 ml  Net -3159.1 ml    Physical Exam  Well appearing middle aged woman, NAD HEENT: Unremarkable Neck:  No JVD, no thyromegally Lungs:  Clear with no wheezes, rales, or rhonchi HEART:  Regular rate rhythm, no murmurs, no rubs, no clicks Abd:  Flat, positive bowel sounds, no organomegally, no rebound, no guarding Ext:  2 plus pulses, no edema, no cyanosis, no clubbing Skin:  No rashes no nodules Neuro:  CN II through XII intact, motor grossly intact   LABS: Basic Metabolic Panel:  Basename 02/29/12 0435 02/28/12 0933 02/28/12 0930  NA 141 -- 140  K 3.7 -- 3.4*  CL 103 -- 101  CO2 27 -- 28  GLUCOSE 152* -- 215*  BUN 68* -- 68*  CREATININE 2.38* -- 2.57*  CALCIUM 9.5 -- 9.7  MG -- 2.1 --  PHOS -- -- --   CBC:  Basename 02/29/12 0435 02/27/12 1522  WBC 6.2 7.5  NEUTROABS -- --  HGB 11.1* 12.0  HCT 32.8* 34.8*  MCV 90.4 87.7  PLT 172 211   Cardiac Enzymes:  Basename 02/28/12 0929 02/28/12 0212 02/27/12 2017  CKTOTAL -- -- --  CKMB -- -- --  CKMBINDEX -- -- --  TROPONINI <0.30 <0.30 <0.30   Hemoglobin A1C:  Basename 02/28/12 0212  HGBA1C 6.7*   Thyroid Function Tests:  Basename 02/28/12 0212  TSH  3.183  T4TOTAL --  T3FREE --  THYROIDAB --    Radiology/Studies:  Dg Chest 2 View 02/27/2012  *RADIOLOGY REPORT*  Clinical Data: Upper and left-sided chest pain since last night, left VATS for resection of left upper lobe lung nodule  CHEST - 2 VIEW  Comparison: Chest x-ray of 03/16/2011 and PET CT of 03/10/2010  Findings: Postoperative changes on the left are noted with volume loss and scarring as well as clips overlying the left hilum.  No active infiltrate or effusion is seen.  Left hemidiaphragm is slightly elevated.  Mild cardiomegaly is stable.  No acute bony abnormality is seen.  IMPRESSION:   Stable postop change on the left.  No active lung disease. Stable cardiomegaly   Original Report Authenticated By: Paul Barry, M.D.     A/P  1. PMVT, non-sustained 2. Long QT 3. CAD 4. Chronic renal insufficiency Rec: Will plan to proceed with ILR and epinephrine infusion later today.  Severiano Utsey,M.D. 2.     

## 2012-03-01 NOTE — H&P (View-Only) (Signed)
   ELECTROPHYSIOLOGY ROUNDING NOTE    Patient Name: Wanda [name] Date of Encounter: 03-01-2012    SUBJECTIVE:Patient feels well.  No chest pain or shortness of breath.  For epinephrine infusion and ILR implantation today to further define diagnosis.  TELEMETRY: Reviewed telemetry pt in sinus rhythm with occasional PVC's.  No couplets or NSVT Filed Vitals:   02/29/12 1846 02/29/12 1918 02/29/12 2009 03/01/12 0421  BP: 152/51  164/54 175/59  Pulse:   58 52  Temp:   98.5 F (36.9 C) 97.9 F (36.6 C)  TempSrc:   Oral Oral  Resp:   18 18  Height:      Weight:    185 lb 13.6 oz (84.3 kg)  SpO2:  98% 96% 99%    Intake/Output Summary (Last 24 hours) at 03/01/12 0700 Last data filed at 03/01/12 0436  Gross per 24 hour  Intake  690.9 ml  Output   3850 ml  Net -3159.1 ml    Physical Exam  Well appearing middle aged woman, NAD HEENT: Unremarkable Neck:  No JVD, no thyromegally Lungs:  Clear with no wheezes, rales, or rhonchi HEART:  Regular rate rhythm, no murmurs, no rubs, no clicks Abd:  Flat, positive bowel sounds, no organomegally, no rebound, no guarding Ext:  2 plus pulses, no edema, no cyanosis, no clubbing Skin:  No rashes no nodules Neuro:  CN II through XII intact, motor grossly intact   LABS: Basic Metabolic Panel:  Basename 02/29/12 0435 02/28/12 0933 02/28/12 0930  NA 141 -- 140  K 3.7 -- 3.4*  CL 103 -- 101  CO2 27 -- 28  GLUCOSE 152* -- 215*  BUN 68* -- 68*  CREATININE 2.38* -- 2.57*  CALCIUM 9.5 -- 9.7  MG -- 2.1 --  PHOS -- -- --   CBC:  Basename 02/29/12 0435 02/27/12 1522  WBC 6.2 7.5  NEUTROABS -- --  HGB 11.1* 12.0  HCT 32.8* 34.8*  MCV 90.4 87.7  PLT 172 211   Cardiac Enzymes:  Basename 02/28/12 0929 02/28/12 0212 02/27/12 2017  CKTOTAL -- -- --  CKMB -- -- --  CKMBINDEX -- -- --  TROPONINI <0.30 <0.30 <0.30   Hemoglobin A1C:  Basename 02/28/12 0212  HGBA1C 6.7*   Thyroid Function Tests:  Basename 02/28/12 1610  TSH  3.183  T4TOTAL --  T3FREE --  THYROIDAB --    Radiology/Studies:  Dg Chest 2 View 02/27/2012  *RADIOLOGY REPORT*  Clinical Data: Upper and left-sided chest pain since last night, left VATS for resection of left upper lobe lung nodule  CHEST - 2 VIEW  Comparison: Chest x-ray of 03/16/2011 and PET CT of 03/10/2010  Findings: Postoperative changes on the left are noted with volume loss and scarring as well as clips overlying the left hilum.  No active infiltrate or effusion is seen.  Left hemidiaphragm is slightly elevated.  Mild cardiomegaly is stable.  No acute bony abnormality is seen.  IMPRESSION:   Stable postop change on the left.  No active lung disease. Stable cardiomegaly   Original Report Authenticated By: Dwyane Dee, M.D.     A/P  1. PMVT, non-sustained 2. Long QT 3. CAD 4. Chronic renal insufficiency Rec: Will plan to proceed with ILR and epinephrine infusion later today.  Gregg Taylor,M.D. 2.

## 2012-03-01 NOTE — Op Note (Signed)
Epinephrine infusion and insertion of an ILR without immediate complication. [id number].

## 2012-03-01 NOTE — Progress Notes (Addendum)
ANTICOAGULATION CONSULT NOTE - Follow Up Consult  Pharmacy Consult for Heparin  Indication: Chest Pain  Allergies  Allergen Reactions  . Blue Dyes (Parenteral) Swelling       . Erythromycin Palpitations   Patient Measurements:  Pt. states weight is 192 pounds (~ 87 kg)  Pt. States height is 5 foot 1 inch ( 61 inches)  Heparin Dosing Weight: 61 kg  Vital Signs: Temp: 97.9 F (36.6 C) (11/22 0421) Temp src: Oral (11/22 0421) BP: 175/59 mmHg (11/22 0421) Pulse Rate: 52  (11/22 0421)  Labs:  Alvira Philips 03/01/12 0605 02/29/12 0435 02/28/12 0930 02/28/12 0929 02/28/12 0212 02/27/12 2017 02/27/12 1522  HGB 10.6* 11.1* -- -- -- -- --  HCT 31.9* 32.8* -- -- -- -- 34.8*  PLT 179 172 -- -- -- -- 211  APTT -- -- -- -- -- -- --  LABPROT -- 14.3 -- -- -- -- --  INR -- 1.13 -- -- -- -- --  HEPARINUNFRC 0.24* 0.38 0.40 -- -- -- --  CREATININE 2.18* 2.38* 2.57* -- -- -- --  CKTOTAL -- -- -- -- -- -- --  CKMB -- -- -- -- -- -- --  TROPONINI -- -- -- <0.30 <0.30 <0.30 --    Estimated Creatinine Clearance: 25 ml/min (by C-G formula based on Cr of 2.18).   Assessment: 66 yo female admitted with chest pain, now s/p catheterization being managed on IV heparin. Heparin level sub-therapeutic at 0.24 units/ml this morning after restarting IV heparin 8 hours post sheath removal. HL was previously therapeutic at rate of 1000 units/hr. Hgb down to 10.6, Plts 179 post procedure. CrCl ~25 ml/min. No issues with bleeding at sheath removal site and no issues with heparin infusion reported by RN or in chart.  Goal of Therapy:  Heparin level 0.3-0.7 units/ml Monitor platelets by anticoagulation protocol: Yes   Plan:  1. Increase IV heparin rate to 1100 units/hr 2. Check 8-hr heparin level 3. Daily HL and CBC while on heparin 4. Follow-up cardiology plans  Benjaman Pott, PharmD    03/01/2012   8:33 AM

## 2012-03-01 NOTE — Progress Notes (Signed)
Received pt from cath lab. Pt is stable, arousable but sleepy. Pt's dressing is dry and intact. Old blood marked on the dressing. Pt is resting. Will continue to monitor.

## 2012-03-01 NOTE — Interval H&P Note (Signed)
History and Physical Interval Note:  03/01/2012 1:20 PM  Wanda [name]  has presented today for surgery, with the diagnosis of Heart failure  The various methods of treatment have been discussed with the patient and family. After consideration of risks, benefits and other options for treatment, the patient has consented to  Procedure(s) (LRB) with comments: IMPLANTABLE CARDIOVERTER DEFIBRILLATOR IMPLANT (N/A) as a surgical intervention .  The patient's history has been reviewed, patient examined, no change in status, stable for surgery.  I have reviewed the patient's chart and labs.  Questions were answered to the patient's satisfaction.     Leonia Reeves.D.

## 2012-03-01 NOTE — Progress Notes (Signed)
Subjective: No complaints, awaiting procedure  Objective: Vital signs in last 24 hours: Temp:  [97.5 F (36.4 C)-98.5 F (36.9 C)] 97.9 F (36.6 C) (11/22 0421) Pulse Rate:  [52-58] 52  (11/22 0421) Resp:  [18] 18  (11/22 0421) BP: (130-175)/(46-65) 175/59 mmHg (11/22 0421) SpO2:  [96 %-100 %] 99 % (11/22 0421) Weight:  [84.3 kg (185 lb 13.6 oz)] 84.3 kg (185 lb 13.6 oz) (11/22 0421) Weight change: -2.1 kg (-4 lb 10.1 oz) Last BM Date: 02/29/12 Intake/Output from previous day: -2444.8 11/21 0701 - 11/22 0700 In: 1440.7 [P.O.:480; I.V.:960.7] Out: 3850 [Urine:3850] Intake/Output this shift:    PE: General:alert and oriented, MAE Heart:S1S2 RRR Lungs:clear without rales or wheezes Abd:+ BS soft, non tender Ext:no edema, rt. Groin cath site without hematoma.    Lab Results:  [facility] 03/01/12 0605 02/29/12 0435  WBC 5.9 6.2  HGB 10.6* 11.1*  HCT 31.9* 32.8*  PLT 179 172   BMET  Basename 03/01/12 0605 02/29/12 0435  NA 137 141  K 3.7 3.7  CL 100 103  CO2 27 27  GLUCOSE 155* 152*  BUN 62* 68*  CREATININE 2.18* 2.38*  CALCIUM 9.6 9.5    Basename 02/28/12 0929 02/28/12 0212  TROPONINI <0.30 <0.30    Lab Results  Component Value Date   CHOL 194 12/29/2010   HDL 49 12/29/2010   LDLCALC 106* 12/29/2010   TRIG 194* 12/29/2010   CHOLHDL 4.0 12/29/2010   Lab Results  Component Value Date   HGBA1C 6.7* 02/28/2012     Lab Results  Component Value Date   TSH 3.183 02/28/2012      Studies/Results: Cardiac cath 02/29/12: No significant ischemia-producing coronary lesions. Patent LAD stent     Medications: I have reviewed the patient's current medications.    . [COMPLETED] aspirin  324 mg Oral Pre-Cath  . aspirin  81 mg Oral Daily  . atorvastatin  10 mg Oral q1800  . bimatoprost  1 drop Both Eyes QHS  . brimonidine  1 drop Both Eyes BID  .  ceFAZolin (ANCEF) IV  2 g Intravenous On Call  . [COMPLETED] chlorhexidine  60 mL Topical Once  . clopidogrel  75  mg Oral BH-q7a  . [COMPLETED] fentaNYL      . ferrous sulfate  325 mg Oral BID  . furosemide  40 mg Oral Daily  . gentamicin irrigation  80 mg Irrigation On Call  . [COMPLETED] heparin      . [COMPLETED] heparin      . hydrALAZINE  50 mg Oral Q8H  . insulin aspart  0-15 Units Subcutaneous TID WC  . insulin aspart  0-5 Units Subcutaneous QHS  . insulin glargine  25 Units Subcutaneous QPC breakfast  . levothyroxine  100 mcg Oral QAC breakfast  . [COMPLETED] lidocaine      . [COMPLETED] midazolam      . [COMPLETED] midazolam      . mometasone-formoterol  2 puff Inhalation BID  . [COMPLETED] nitroGLYCERIN      . potassium chloride SA  20 mEq Oral BID  . sodium chloride  3 mL Intravenous Q12H  . verapamil  180 mg Oral BID  . [COMPLETED] verapamil      . [DISCONTINUED]  ceFAZolin (ANCEF) IV  2 g Intravenous On Call  . [DISCONTINUED] chlorhexidine  60 mL Topical Once  . [DISCONTINUED] gentamicin irrigation  80 mg Irrigation On Call  . [DISCONTINUED] potassium chloride SA  20 mEq Oral Daily  . [DISCONTINUED] sodium  chloride  3 mL Intravenous Q12H   Assessment/Plan: Principal Problem:  *Chest pain syndrome, S/P multiple admissions for chest pain Active Problems:  Palpitations  CAD, multiple caths. 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 Graciela Husbands has seen the pt., recommends BB. She is currently on Verapamil and HR 52-58, will d/c Verapamil  first. For epinephrine challenge today then loop.  Medication decisions will be made after these tests.  Will d/c Heparin and NTG and resume home IMDUR.   LOS: 3 days   INGOLD,LAURA R 03/01/2012, 8:40 AM  Patient seen and examined. Agree with assessment and plan. No further arrhythmia.  Agree with beta blocker therapy, will not start until after epinephrine challenge which is scheduled today. Ca 9.5; Mg 2.1. Today's K 3.7.   Lennette Bihari, MD, [facility] 03/01/2012 9:50 AM

## 2012-03-02 LAB — BASIC METABOLIC PANEL
CO2: 27 mEq/L (ref 19–32)
Chloride: 106 mEq/L (ref 96–112)
Glucose, Bld: 118 mg/dL — ABNORMAL HIGH (ref 70–99)
Potassium: 4.3 mEq/L (ref 3.5–5.1)
Sodium: 145 mEq/L (ref 135–145)

## 2012-03-02 LAB — CBC
Hemoglobin: 10.7 g/dL — ABNORMAL LOW (ref 12.0–15.0)
MCH: 30.4 pg (ref 26.0–34.0)
RBC: 3.52 MIL/uL — ABNORMAL LOW (ref 3.87–5.11)
WBC: 5.9 10*3/uL (ref 4.0–10.5)

## 2012-03-02 LAB — PRO B NATRIURETIC PEPTIDE: Pro B Natriuretic peptide (BNP): 912.4 pg/mL — ABNORMAL HIGH (ref 0–125)

## 2012-03-02 LAB — GLUCOSE, CAPILLARY: Glucose-Capillary: 149 mg/dL — ABNORMAL HIGH (ref 70–99)

## 2012-03-02 NOTE — Progress Notes (Signed)
Subjective:  C/O localized CP over ILR site+  Objective:  Temp:  [98 F (36.7 C)-98.5 F (36.9 C)] 98 F (36.7 C) (11/23 0354) Pulse Rate:  [51-62] 57  (11/23 0354) Resp:  [18-19] 19  (11/23 0354) BP: (129-177)/(47-65) 177/65 mmHg (11/23 0354) SpO2:  [100 %] 100 % (11/23 0354) Weight:  [84.369 kg (186 lb)] 84.369 kg (186 lb) (11/23 0354) Weight change: 0.069 kg (2.4 oz)  Intake/Output from previous day: 11/22 0701 - 11/23 0700 In: 954 [P.O.:954] Out: 1400 [Urine:1400]  Intake/Output from this shift:    Physical Exam: General appearance: alert, cooperative and no distress Neck: no adenopathy, no carotid bruit, no JVD, supple, symmetrical, trachea midline and thyroid not enlarged, symmetric, no tenderness/mass/nodules Lungs: clear to auscultation bilaterally Extremities: extremities normal, atraumatic, no cyanosis or edema  Lab Results: Results for orders placed during the hospital encounter of 02/27/12 (from the past 48 hour(s))  GLUCOSE, CAPILLARY     Status: Abnormal   Collection Time   02/29/12 11:11 AM      Component Value Range Comment   Glucose-Capillary 119 (*) 70 - 99 mg/dL    Comment 1 Notify RN     POCT ACTIVATED CLOTTING TIME     Status: Normal   Collection Time   02/29/12  2:22 PM      Component Value Range Comment   Activated Clotting Time 130     GLUCOSE, CAPILLARY     Status: Abnormal   Collection Time   02/29/12  2:50 PM      Component Value Range Comment   Glucose-Capillary 136 (*) 70 - 99 mg/dL   GLUCOSE, CAPILLARY     Status: Abnormal   Collection Time   02/29/12  4:45 PM      Component Value Range Comment   Glucose-Capillary 127 (*) 70 - 99 mg/dL    Comment 1 Documented in Chart      Comment 2 Notify RN     GLUCOSE, CAPILLARY     Status: Abnormal   Collection Time   02/29/12  8:18 PM      Component Value Range Comment   Glucose-Capillary 172 (*) 70 - 99 mg/dL    Comment 1 Documented in Chart      Comment 2 Notify RN     HEPARIN LEVEL  (UNFRACTIONATED)     Status: Abnormal   Collection Time   03/01/12  6:05 AM      Component Value Range Comment   Heparin Unfractionated 0.24 (*) 0.30 - 0.70 IU/mL   CBC     Status: Abnormal   Collection Time   03/01/12  6:05 AM      Component Value Range Comment   WBC 5.9  4.0 - 10.5 K/uL    RBC 3.50 (*) 3.87 - 5.11 MIL/uL    Hemoglobin 10.6 (*) 12.0 - 15.0 g/dL    HCT 16.1 (*) 09.6 - 46.0 %    MCV 91.1  78.0 - 100.0 fL    MCH 30.3  26.0 - 34.0 pg    MCHC 33.2  30.0 - 36.0 g/dL    RDW 04.5  40.9 - 81.1 %    Platelets 179  150 - 400 K/uL   BASIC METABOLIC PANEL     Status: Abnormal   Collection Time   03/01/12  6:05 AM      Component Value Range Comment   Sodium 137  135 - 145 mEq/L    Potassium 3.7  3.5 - 5.1 mEq/L  Chloride 100  96 - 112 mEq/L    CO2 27  19 - 32 mEq/L    Glucose, Bld 155 (*) 70 - 99 mg/dL    BUN 62 (*) 6 - 23 mg/dL    Creatinine, Ser 1.61 (*) 0.50 - 1.10 mg/dL    Calcium 9.6  8.4 - 09.6 mg/dL    GFR calc non Af Amer 22 (*) >90 mL/min    GFR calc Af Amer 26 (*) >90 mL/min   GLUCOSE, CAPILLARY     Status: Abnormal   Collection Time   03/01/12  6:48 AM      Component Value Range Comment   Glucose-Capillary 135 (*) 70 - 99 mg/dL   GLUCOSE, CAPILLARY     Status: Abnormal   Collection Time   03/01/12 11:41 AM      Component Value Range Comment   Glucose-Capillary 138 (*) 70 - 99 mg/dL    Comment 1 Notify RN     HEPARIN LEVEL (UNFRACTIONATED)     Status: Abnormal   Collection Time   03/01/12  3:45 PM      Component Value Range Comment   Heparin Unfractionated <0.10 (*) 0.30 - 0.70 IU/mL   GLUCOSE, CAPILLARY     Status: Abnormal   Collection Time   03/01/12  4:19 PM      Component Value Range Comment   Glucose-Capillary 144 (*) 70 - 99 mg/dL    Comment 1 Documented in Chart      Comment 2 Notify RN     GLUCOSE, CAPILLARY     Status: Abnormal   Collection Time   03/01/12  9:32 PM      Component Value Range Comment   Glucose-Capillary 137 (*) 70 -  99 mg/dL    Comment 1 Documented in Chart      Comment 2 Notify RN     CBC     Status: Abnormal   Collection Time   03/02/12  5:55 AM      Component Value Range Comment   WBC 5.9  4.0 - 10.5 K/uL    RBC 3.52 (*) 3.87 - 5.11 MIL/uL    Hemoglobin 10.7 (*) 12.0 - 15.0 g/dL    HCT 04.5 (*) 40.9 - 46.0 %    MCV 91.5  78.0 - 100.0 fL    MCH 30.4  26.0 - 34.0 pg    MCHC 33.2  30.0 - 36.0 g/dL    RDW 81.1  91.4 - 78.2 %    Platelets 180  150 - 400 K/uL   BASIC METABOLIC PANEL     Status: Abnormal   Collection Time   03/02/12  5:55 AM      Component Value Range Comment   Sodium 145  135 - 145 mEq/L    Potassium 4.3  3.5 - 5.1 mEq/L    Chloride 106  96 - 112 mEq/L    CO2 27  19 - 32 mEq/L    Glucose, Bld 118 (*) 70 - 99 mg/dL    BUN 65 (*) 6 - 23 mg/dL    Creatinine, Ser 9.56 (*) 0.50 - 1.10 mg/dL    Calcium 9.8  8.4 - 21.3 mg/dL    GFR calc non Af Amer 19 (*) >90 mL/min    GFR calc Af Amer 22 (*) >90 mL/min   PRO B NATRIURETIC PEPTIDE     Status: Abnormal   Collection Time   03/02/12  5:55 AM      Component  Value Range Comment   Pro B Natriuretic peptide (BNP) 912.4 (*) 0 - 125 pg/mL   GLUCOSE, CAPILLARY     Status: Abnormal   Collection Time   03/02/12  6:34 AM      Component Value Range Comment   Glucose-Capillary 149 (*) 70 - 99 mg/dL    Comment 1 Documented in Chart      Comment 2 Notify RN       Imaging: Imaging results have been reviewed  Assessment/Plan:   1. Principal Problem: 2.  *Chest pain syndrome, S/P multiple admissions for chest pain 3. Active Problems: 4.  Palpitations 5.  CAD, multiple caths. Last cath June 2012, no significant LAD ISR 6.  Hypertension associated with diabetes 7.  Diabetes mellitus 8.  Chronic renal disease, stage III 9.  PAF, Amiodarone stopped by her cardiologist in [facility] Oct 2013 10.  Obesity, Class I, BMI 30-34.9 11.  PVC's (premature ventricular contractions) 12.  Syncope and collapse, Oct 2013- Dr Croitoru had planned a loop  recorder for 02/29/12 13.  Allergy to intravenous contrast- "swelling" 14.  Dyslipidemia 15.  Chronic diastolic CHF (congestive heart failure) 16.  Hypokalemia, K+ 2.5 on admission 17.  Prolonged QT interval 18.  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   Time Spent Directly with Patient:  20 minutes  Length of Stay:  LOS: 4 days   Insignif CAD at Cath. TsDpt with LQT s/p neg epi infusion. ILR implant. NSR on tele. Scr 2.54. Ambulate today. Prob home tomorrow. ROV with Dr. Salena Saner. Re check labs AM.  Runell Gess 03/02/2012, 8:40 AM

## 2012-03-02 NOTE — Progress Notes (Signed)
Pt ambulated in hallway 300 ft with four point cane and assist X 1. Pt tolerated activity well. Will continue to monitor.

## 2012-03-03 LAB — BASIC METABOLIC PANEL
BUN: 65 mg/dL — ABNORMAL HIGH (ref 6–23)
Creatinine, Ser: 2.65 mg/dL — ABNORMAL HIGH (ref 0.50–1.10)
GFR calc Af Amer: 20 mL/min — ABNORMAL LOW (ref >90)
GFR calc non Af Amer: 18 mL/min — ABNORMAL LOW (ref >90)
Glucose, Bld: 138 mg/dL — ABNORMAL HIGH (ref 70–99)

## 2012-03-03 LAB — CBC
HCT: 30 % — ABNORMAL LOW (ref 36.0–46.0)
MCHC: 33 g/dL (ref 30.0–36.0)
MCV: 91.7 fL (ref 78.0–100.0)
RDW: 15.1 % (ref 11.5–15.5)

## 2012-03-03 LAB — GLUCOSE, CAPILLARY: Glucose-Capillary: 95 mg/dL (ref 70–99)

## 2012-03-03 MED ORDER — HYDRALAZINE HCL 50 MG PO TABS: 50.0000 mg | ORAL_TABLET | Freq: Three times a day (TID) | ORAL | Status: DC

## 2012-03-03 MED ORDER — ISOSORBIDE MONONITRATE ER 60 MG PO TB24: 60.0000 mg | ORAL_TABLET | Freq: Every day | ORAL | Status: DC

## 2012-03-03 MED ORDER — ACETAMINOPHEN-CODEINE #3 300-30 MG PO TABS: 1.0000 | ORAL_TABLET | ORAL | Status: DC | PRN

## 2012-03-03 MED ORDER — FUROSEMIDE 40 MG PO TABS: 40.0000 mg | ORAL_TABLET | Freq: Every day | ORAL | Status: DC

## 2012-03-03 MED ORDER — ATORVASTATIN CALCIUM 10 MG PO TABS: 10.0000 mg | ORAL_TABLET | Freq: Every day | ORAL | Status: DC

## 2012-03-03 MED ORDER — NEBIVOLOL HCL 5 MG PO TABS: 5.0000 mg | ORAL_TABLET | Freq: Every day | ORAL | Status: DC

## 2012-03-03 MED ORDER — POTASSIUM CHLORIDE CRYS ER 20 MEQ PO TBCR: 20.0000 meq | EXTENDED_RELEASE_TABLET | Freq: Two times a day (BID) | ORAL | Status: DC

## 2012-03-03 MED ORDER — NEBIVOLOL HCL 5 MG PO TABS
5.0000 mg | ORAL_TABLET | Freq: Every day | ORAL | Status: DC
  Administered 2012-03-03: 5 mg via ORAL
  Filled 2012-03-03 (×2): qty 1

## 2012-03-03 NOTE — Progress Notes (Signed)
Subjective:  Only C/O mild chest wall pain from ILR  Objective:  Temp:  [98.1 F (36.7 C)-98.7 F (37.1 C)] 98.6 F (37 C) (11/24 0405) Pulse Rate:  [51-64] 58  (11/24 0607) Resp:  [18-19] 19  (11/24 0405) BP: (120-172)/(51-77) 141/51 mmHg (11/24 0607) SpO2:  [97 %-100 %] 97 % (11/24 0919) Weight:  [84.913 kg (187 lb 3.2 oz)] 84.913 kg (187 lb 3.2 oz) (11/24 0405) Weight change: 0.544 kg (1 lb 3.2 oz)  Intake/Output from previous day: 11/23 0701 - 11/24 0700 In: 723 [P.O.:720; I.V.:3] Out: 2050 [Urine:2050]  Intake/Output from this shift: Total I/O In: 240 [P.O.:240] Out: 250 [Urine:250]  Physical Exam: General appearance: alert, cooperative and no distress Neck: no adenopathy, no carotid bruit, no JVD, supple, symmetrical, trachea midline and thyroid not enlarged, symmetric, no tenderness/mass/nodules Lungs: clear to auscultation bilaterally Heart: regular rate and rhythm, S1, S2 normal, no murmur, click, rub or gallop  Lab Results: Results for orders placed during the hospital encounter of 02/27/12 (from the past 48 hour(s))  GLUCOSE, CAPILLARY     Status: Abnormal   Collection Time   03/01/12 11:41 AM      Component Value Range Comment   Glucose-Capillary 138 (*) 70 - 99 mg/dL    Comment 1 Notify RN     HEPARIN LEVEL (UNFRACTIONATED)     Status: Abnormal   Collection Time   03/01/12  3:45 PM      Component Value Range Comment   Heparin Unfractionated <0.10 (*) 0.30 - 0.70 IU/mL   GLUCOSE, CAPILLARY     Status: Abnormal   Collection Time   03/01/12  4:19 PM      Component Value Range Comment   Glucose-Capillary 144 (*) 70 - 99 mg/dL    Comment 1 Documented in Chart      Comment 2 Notify RN     GLUCOSE, CAPILLARY     Status: Abnormal   Collection Time   03/01/12  9:32 PM      Component Value Range Comment   Glucose-Capillary 137 (*) 70 - 99 mg/dL    Comment 1 Documented in Chart      Comment 2 Notify RN     CBC     Status: Abnormal   Collection Time   03/02/12  5:55 AM      Component Value Range Comment   WBC 5.9  4.0 - 10.5 K/uL    RBC 3.52 (*) 3.87 - 5.11 MIL/uL    Hemoglobin 10.7 (*) 12.0 - 15.0 g/dL    HCT 86.5 (*) 78.4 - 46.0 %    MCV 91.5  78.0 - 100.0 fL    MCH 30.4  26.0 - 34.0 pg    MCHC 33.2  30.0 - 36.0 g/dL    RDW 69.6  29.5 - 28.4 %    Platelets 180  150 - 400 K/uL   BASIC METABOLIC PANEL     Status: Abnormal   Collection Time   03/02/12  5:55 AM      Component Value Range Comment   Sodium 145  135 - 145 mEq/L    Potassium 4.3  3.5 - 5.1 mEq/L    Chloride 106  96 - 112 mEq/L    CO2 27  19 - 32 mEq/L    Glucose, Bld 118 (*) 70 - 99 mg/dL    BUN 65 (*) 6 - 23 mg/dL    Creatinine, Ser 1.32 (*) 0.50 - 1.10 mg/dL    Calcium 9.8  8.4 - 10.5 mg/dL    GFR calc non Af Amer 19 (*) >90 mL/min    GFR calc Af Amer 22 (*) >90 mL/min   PRO B NATRIURETIC PEPTIDE     Status: Abnormal   Collection Time   03/02/12  5:55 AM      Component Value Range Comment   Pro B Natriuretic peptide (BNP) 912.4 (*) 0 - 125 pg/mL   GLUCOSE, CAPILLARY     Status: Abnormal   Collection Time   03/02/12  6:34 AM      Component Value Range Comment   Glucose-Capillary 149 (*) 70 - 99 mg/dL    Comment 1 Documented in Chart      Comment 2 Notify RN     GLUCOSE, CAPILLARY     Status: Normal   Collection Time   03/02/12 11:19 AM      Component Value Range Comment   Glucose-Capillary 99  70 - 99 mg/dL   GLUCOSE, CAPILLARY     Status: Abnormal   Collection Time   03/02/12  4:20 PM      Component Value Range Comment   Glucose-Capillary 162 (*) 70 - 99 mg/dL   GLUCOSE, CAPILLARY     Status: Abnormal   Collection Time   03/02/12  9:17 PM      Component Value Range Comment   Glucose-Capillary 105 (*) 70 - 99 mg/dL    Comment 1 Documented in Chart      Comment 2 Notify RN     CBC     Status: Abnormal   Collection Time   03/03/12  5:06 AM      Component Value Range Comment   WBC 5.9  4.0 - 10.5 K/uL    RBC 3.27 (*) 3.87 - 5.11 MIL/uL     Hemoglobin 9.9 (*) 12.0 - 15.0 g/dL    HCT 65.7 (*) 84.6 - 46.0 %    MCV 91.7  78.0 - 100.0 fL    MCH 30.3  26.0 - 34.0 pg    MCHC 33.0  30.0 - 36.0 g/dL    RDW 96.2  95.2 - 84.1 %    Platelets 164  150 - 400 K/uL   GLUCOSE, CAPILLARY     Status: Normal   Collection Time   03/03/12  6:15 AM      Component Value Range Comment   Glucose-Capillary 95  70 - 99 mg/dL    Comment 1 Documented in Chart      Comment 2 Notify RN       Imaging: Imaging results have been reviewed  Assessment/Plan:   1. Principal Problem: 2.  *Chest pain syndrome, S/P multiple admissions for chest pain 3. Active Problems: 4.  Palpitations 5.  CAD, multiple caths. Last cath June 2012, no significant LAD ISR 6.  Hypertension associated with diabetes 7.  Diabetes mellitus 8.  Chronic renal disease, stage III 9.  PAF, Amiodarone stopped by her cardiologist in [facility] Oct 2013 10.  Obesity, Class I, BMI 30-34.9 11.  PVC's (premature ventricular contractions) 12.  Syncope and collapse, Oct 2013- Dr Croitoru had planned a loop recorder for 02/29/12 13.  Allergy to intravenous contrast- "swelling" 14.  Dyslipidemia 15.  Chronic diastolic CHF (congestive heart failure) 16.  Hypokalemia, K+ 2.5 on admission 17.  Prolonged QT interval 18.  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   Time Spent Directly with Patient:  20 minutes  Length of Stay:  LOS: 5 days  NSR w/o arrhythmias. Scr 2.54. S/P cath with non critical CAD and ILR. OK for D/C home this AM. ROV with Dr. Salena Saner 1-2 weeks.  Noreta Kue J 03/03/2012, 9:30 AM

## 2012-03-03 NOTE — Evaluation (Signed)
Physical Therapy Evaluation Patient Details Name: Wanda [name] MRN: [id number] DOB: 09-30-1945 Today's Date: 03/03/2012 Time: 6213-0865 PT Time Calculation (min): 16 min  PT Assessment / Plan / Recommendation Clinical Impression  Patient is a 66 yo female admitted with chest pain syndrome, s/p ILR implanted.  Patient will benefit from acute PT to maximize independence prior to discharge home with sister.    PT Assessment  Patient needs continued PT services    Follow Up Recommendations  No PT follow up;Supervision/Assistance - 24 hour    Does the patient have the potential to tolerate intense rehabilitation      Barriers to Discharge None      Equipment Recommendations  3 in 1 bedside comode    Recommendations for Other Services     Frequency Min 3X/week    Precautions / Restrictions Precautions Precautions: Fall Restrictions Weight Bearing Restrictions: No   Pertinent Vitals/Pain       Mobility  Bed Mobility Bed Mobility: Supine to Sit;Sitting - Scoot to Edge of Bed Supine to Sit: 6: Modified independent (Device/Increase time);With rails;HOB flat Sitting - Scoot to Edge of Bed: 7: Independent Details for Bed Mobility Assistance: No cues or assist needed Transfers Transfers: Sit to Stand;Stand to Sit Sit to Stand: 5: Supervision;From bed Stand to Sit: 5: Supervision;To bed Details for Transfer Assistance: Verbal cues for hand placement.  Slow movements to standing position. Ambulation/Gait Ambulation/Gait Assistance: 4: Min guard Ambulation Distance (Feet): 380 Feet Assistive device: Large base quad cane Ambulation/Gait Assistance Details: Verbal cues for gait sequence and safe use of quad cane. Gait Pattern: Step-to pattern;Decreased stance time - right;Decreased step length - left;Antalgic;Trunk flexed Gait velocity: Slow gait speed           PT Diagnosis: Abnormality of gait;Difficulty walking;Generalized weakness;Acute pain  PT Problem List:  Decreased strength;Decreased activity tolerance;Decreased mobility;Decreased knowledge of use of DME;Cardiopulmonary status limiting activity;Pain PT Treatment Interventions: DME instruction;Gait training;Functional mobility training;Patient/family education   PT Goals Acute Rehab PT Goals PT Goal Formulation: With patient Time For Goal Achievement: 03/10/12 Potential to Achieve Goals: Good Pt will go Sit to Stand: with modified independence;with upper extremity assist PT Goal: Sit to Stand - Progress: Goal set today Pt will go Stand to Sit: with modified independence;with upper extremity assist PT Goal: Stand to Sit - Progress: Goal set today Pt will Ambulate: >150 feet;with modified independence;with cane PT Goal: Ambulate - Progress: Goal set today  Visit Information  Last PT Received On: 03/03/12 Assistance Needed: +1    Subjective Data  Subjective: "I need to get up and walk so I can go home" Patient Stated Goal: To return home.   Prior Functioning  Home Living Lives With: Family (Sister) Available Help at Discharge: Family;Available 24 hours/day Type of Home: Apartment Home Access: Level entry Home Layout: One level Bathroom Shower/Tub: Engineer, manufacturing systems: Standard Home Adaptive Equipment: Quad cane Prior Function Level of Independence: Independent with assistive device(s) Able to Take Stairs?: No Driving: No Vocation: Retired Musician: No difficulties Dominant Hand: Right    Cognition  Overall Cognitive Status: Appears within functional limits for tasks assessed/performed Arousal/Alertness: Awake/alert Orientation Level: Oriented X4 / Intact Behavior During Session: [facility] for tasks performed    Extremity/Trunk Assessment Right Upper Extremity Assessment RUE ROM/Strength/Tone: [facility] for tasks assessed Left Upper Extremity Assessment LUE ROM/Strength/Tone: WFL for tasks assessed Right Lower Extremity Assessment RLE  ROM/Strength/Tone: Deficits RLE ROM/Strength/Tone Deficits: Strength 4/5 - reports pain in hip RLE Sensation: WFL - Light Touch  Left Lower Extremity Assessment LLE ROM/Strength/Tone: WFL for tasks assessed LLE Sensation: WFL - Light Touch   Balance    End of Session PT - End of Session Equipment Utilized During Treatment: Gait belt Activity Tolerance: Patient limited by pain Patient left: in bed;with call bell/phone within reach (sitting on EOB) Nurse Communication: Mobility status  GP     Vena Austria 03/03/2012, 9:37 AM Durenda Hurt. Renaldo Fiddler, [facility] Acute Rehab Services Pager [phone]

## 2012-03-03 NOTE — Discharge Summary (Signed)
Physician Discharge Summary  Patient ID: Wanda [name] MRN: [id number] DOB/AGE: [birth year] 66 y.o.  Admit date: 02/27/2012 Discharge date: 03/03/2012  Discharge Diagnoses:  Principal Problem:  *Chest pain syndrome, negative MI, non obstructing CAD on cath 02/29/12, patent LAD stent Active Problems:  Palpitations  CAD, multiple caths. Last cath 02/2012- no significant LAD ISR, no obstructing CAD  Hypertension associated with diabetes  Diabetes mellitus  Chronic renal disease, stage III  PAF, Amiodarone stopped by her cardiologist in [facility] Oct 2013  Obesity, Class I, BMI 30-34.9  PVC's (premature ventricular contractions)  Syncope and collapse, Oct 2013- Dr Croitoru had planned a loop recorder for 02/29/12  Allergy to intravenous contrast- "swelling"  Dyslipidemia  Chronic diastolic CHF (congestive heart failure)  Hypokalemia, K+ 2.5 on admission  Prolonged QT interval  Torsades de pointes, self terminating this admit   Procedures: 02/29/12 cardiac cath by Dr. Royann Shivers 03/01/12 Epinephrine infusion and insertion of an ILR without immediate complication by Dr. Ladona Ridgel  Discharged Condition: good  Hospital Course: 66 y/o female who we have followed since November 2011. She travels back and forth between here and [facility]. She has had several caths-'90s, 2000, 2005 showing no significant CAD. In Aug 2009 she had a BMS placed to her Dx. In Jan 2011 she had an LAD stent while in [place]. Follow up cath for chest pain one month later was OK. We saw her in November for chest pain. Work up revealed a lung lesion suspicious for cancer. She went back to [facility] and had a VATs. Apparently the diagnosis was not cancer. She came to [facility] in 2012 and had at least 3 admissions for chest pain with a low risk Myoview. Echo has shown NL LVF with diastolic dysfunction. We last saw her in Sept 2012 and then she went back to [facility] (DR Lovena Neighbours is her cardiologist in [place]). She presented to the office  02/27/12 to see Dr Royann Shivers. The pt apparently had a syncopal spell recently and was taken off Amiodarone and Norvasc, and put on Verapamil. She has had bradycardia in the past with Coreg. Dr Royann Shivers had planned to put in a loop recorder later this week. She left the office and then decided to come to the ER to be seen for chest pain. She is a vague historian, chest "soreness" and she admits she is afraid she might have a heart attack. She tells me while she was in [place] she went to the hospital " about 20 times " for chest pain. She has not been re-cathed per her history (Scr 2.4).  EKG on admit shows PVCs, PACs. Her BNP is slightly elevated 1334, Troponin negative.   During the night after admit she has self terminating Torsades.  She also was hypokalemic.   Her Qtc was prolonged.  EP consult was obtained with Dr. Graciela Husbands and Dr. Ladona Ridgel.    Per Dr. Graciela Husbands "The fact that there have been no events prior to date speak against long QT syndrome; while she has had QT prolongation in hospital, this has occurred in the setting of hypokalemia.  I think there is out you in making a diagnosis given the suggestions as outlined above. 2 strategies present themselves as complementary. The first, given the fact that her ECG looks most like LQT1 epinephrine infusion is quite sensitive in this cohort.  There is differences of opinion regarding the role of ICD here especially given the fact that her potassium was 3.4 the time of her polymorphic ventricular tachycardia episode. However,  I think beta blockers are the primary form of therapy. Although she has not tolerated them in the past, there multitude of beta blockers worth trying. "  Pt underwent cardiac cath  02/29/12 revealing No significant ischemia-producing coronary lesions. Patent LAD stent.   The next day she underwent Epinephrine infusion and insertion of an ILR. Results of epinepherine infusion not in computer at this time.   Pt was kept in hospital to ambulate, PT  consult was obtained.  They recommended 3;1 stool but pt only wanted a shower bench.  This was ordered to prevent falls.  She had ambulated in hall with cane.  By 03/03/12 she was seen by Dr. Allyson Sabal and ready for discharge home.  She requested stronger pain med than ultram, she was given tylenol #3 without refills.  She has pain at Lt shoulder with movement.  Also complains of loop recorder pain, though the site is without swelling.  She has low tolerance for pain.  She lives with family and will not be alone at home.  Please note day of discharge she was changed from verapamil to Bystolic, at [facility] recommendation.    Also prior to discharge her Cr. Was slightly elevated- this will be followed as an outpt.  Consults: cardiology EP with Dr. Graciela Husbands and Dr. Ladona Ridgel.  Significant Diagnostic Studies:   BMET    Component Value Date/Time   NA 138 03/03/2012 0934   K 4.0 03/03/2012 0934   CL 100 03/03/2012 0934   CO2 25 03/03/2012 0934   GLUCOSE 138* 03/03/2012 0934   BUN 65* 03/03/2012 0934   CREATININE 2.65* 03/03/2012 0934   CALCIUM 9.9 03/03/2012 0934   GFRNONAA 18* 03/03/2012 0934   GFRAA 20* 03/03/2012 0934    CBC    Component Value Date/Time   WBC 5.9 03/03/2012 0506   RBC 3.27* 03/03/2012 0506   HGB 9.9* 03/03/2012 0506   HCT 30.0* 03/03/2012 0506   PLT 164 03/03/2012 0506   MCV 91.7 03/03/2012 0506   MCH 30.3 03/03/2012 0506   MCHC 33.0 03/03/2012 0506   RDW 15.1 03/03/2012 0506   LYMPHSABS 2.4 03/16/2011 0105   MONOABS 0.6 03/16/2011 0105   EOSABS 0.1 03/16/2011 0105   BASOSABS 0.0 03/16/2011 0105   Negative Troponin X 4  TSH 3.183  Clinical Data: Upper and left-sided chest pain since last night,  left VATS for resection of left upper lobe lung nodule  CHEST - 2 VIEW  Comparison: Chest x-ray of 03/16/2011 and PET CT of 03/10/2010  Findings: Postoperative changes on the left are noted with volume  loss and scarring as well as clips overlying the left hilum. No  active  infiltrate or effusion is seen. Left hemidiaphragm is  slightly elevated. Mild cardiomegaly is stable. No acute bony  abnormality is seen.  IMPRESSION:  Stable postop change on the left. No active lung disease.  Stable cardiomegaly     Discharge Exam: Blood pressure 156/57, pulse 56, temperature 98.4 F (36.9 C), temperature source Oral, resp. rate 18, height 5\' 1"  (1.549 m), weight 84.913 kg (187 lb 3.2 oz), SpO2 100.00%.  AM exam prior to discharge: Physical Exam:  General appearance: alert, cooperative and no distress  Neck: no adenopathy, no carotid bruit, no JVD, supple, symmetrical, trachea midline and thyroid not enlarged, symmetric, no tenderness/mass/nodules  Lungs: clear to auscultation bilaterally  Heart: regular rate and rhythm, S1, S2 normal, no murmur, click, rub or gallop  Dressing removed from Loop site.  Steri strips dry, no  swelling or redness at site.    Disposition: 01-Home or Self Care     Medication List     As of 03/03/2012  2:58 PM    STOP taking these medications         NIFEdipine 60 MG 24 hr tablet   Commonly known as: PROCARDIA XL/ADALAT-CC      traMADol 50 MG tablet   Commonly known as: ULTRAM      verapamil 180 MG CR tablet   Commonly known as: CALAN-SR      TAKE these medications         acetaminophen-codeine 300-30 MG per tablet   Commonly known as: TYLENOL #3   Take 1 tablet by mouth every 4 (four) hours as needed for pain.      albuterol 108 (90 BASE) MCG/ACT inhaler   Commonly known as: PROVENTIL HFA;VENTOLIN HFA   Inhale 2 puffs into the lungs every 6 (six) hours as needed. For shortness of breath/wheezing      aspirin 81 MG tablet   Take 81 mg by mouth daily.      atorvastatin 10 MG tablet   Commonly known as: LIPITOR   Take 1 tablet (10 mg total) by mouth daily at 6 PM.      brimonidine 0.15 % ophthalmic solution   Commonly known as: ALPHAGAN   Place 1 drop into both eyes 2 (two) times daily.      clopidogrel 75 MG  tablet   Commonly known as: PLAVIX   Take 75 mg by mouth every morning.      cyclobenzaprine 5 MG tablet   Commonly known as: FLEXERIL   Take 5 mg by mouth 3 (three) times daily as needed. For muscle spasms      ferrous sulfate 325 (65 FE) MG tablet   Take 325 mg by mouth 2 (two) times daily.      Fluticasone-Salmeterol 500-50 MCG/DOSE Aepb   Commonly known as: ADVAIR   Inhale 1 puff into the lungs every 12 (twelve) hours.      furosemide 40 MG tablet   Commonly known as: LASIX   Take 1 tablet (40 mg total) by mouth daily.      hydrALAZINE 50 MG tablet   Commonly known as: APRESOLINE   Take 1 tablet (50 mg total) by mouth every 8 (eight) hours.      insulin glargine 100 UNIT/ML injection   Commonly known as: LANTUS   Inject 25 Units into the skin daily after breakfast.      insulin lispro 100 UNIT/ML injection   Commonly known as: HUMALOG   Inject 2-14 Units into the skin 3 (three) times daily between meals as needed. sliding scale-150-175=2 units, 176-200=4 units, 201-225=6 units, 226-250=8 units, 251-300=10 units, 301-350=12 units & >350=14 units & contact doctor      isosorbide mononitrate 60 MG 24 hr tablet   Commonly known as: IMDUR   Take 1 tablet (60 mg total) by mouth daily.      levothyroxine 100 MCG tablet   Commonly known as: SYNTHROID, LEVOTHROID   Take 100 mcg by mouth daily.      LUMIGAN 0.01 % Soln   Generic drug: bimatoprost   Place 1 drop into both eyes at bedtime.      meclizine 25 MG tablet   Commonly known as: ANTIVERT   Take 25 mg by mouth 3 (three) times daily as needed.      metolazone 5 MG tablet   Commonly known as:  ZAROXOLYN   Take 5 mg by mouth daily as needed. Only takes for a weight gain of 5lbs      nebivolol 5 MG tablet   Commonly known as: BYSTOLIC   Take 1 tablet (5 mg total) by mouth daily.      nitroGLYCERIN 0.4 MG SL tablet   Commonly known as: NITROSTAT   Place 0.4 mg under the tongue every 5 (five) minutes as needed. For  chest pain      omeprazole 20 MG capsule   Commonly known as: PRILOSEC   Take 20 mg by mouth every morning.      ondansetron 8 MG disintegrating tablet   Commonly known as: ZOFRAN-ODT   Take 8 mg by mouth every 6 (six) hours as needed.      potassium chloride SA 20 MEQ tablet   Commonly known as: K-DUR,KLOR-CON   Take 1 tablet (20 mEq total) by mouth 2 (two) times daily.           Follow-up Information    Follow up with Abelino Derrick, PA. On 03/13/2012. (at 2:00pm)    Contact information:   [address] [place] [address] [phone]        Discharge Instructions: Supplemental Discharge Instructions for  Loop Recorder Patients  Activity Increase activity slowly,  You can use your arm no special instructions.  NO DRIVING is preferable for 2 weeks. DO wear your seatbelt, even if it crosses over the pacemaker site.  WOUND CARE   Keep the wound area clean and dry.  Remove the dressing the day after you return home (usually 48 hours after the procedure).   DO NOT SUBMERGE UNDER WATER UNTIL FULLY HEALED (no tub baths, hot tubs, swimming pools, etc.).    You  may shower or take a sponge bath after the dressing is removed. DO NOT SOAK the area and do not allow the shower to directly spray on the site.   If you have staples, these will be removed in the office in 7-14 days.   If you have tape/steri-strips on your wound, these will fall off; do not pull them off prematurely.     No bandage is needed on the site.  DO  NOT apply any creams, oils, or ointments to the wound area.   If you notice any drainage or discharge from the wound, any swelling, excessive redness or bruising at the site, or if you develop a fever > 101? F after you are discharged home, call the office at once.  Special Instructions   You are still able to use cellular telephones.  Avoid carrying your cellular phone near your device.   When traveling through airports, show security personnel  your identification card to avoid being screened in the metal detectors.    Avoid arc welding equipment, MRI testing (magnetic resonance imaging), TENS units (transcutaneous nerve stimulators).  Call the office for questions about other devices.   Avoid electrical appliances that are in poor condition or are not properly grounded.   Microwave ovens are safe to be near or to operate.  Heart Healthy Diabetic diet.  The office can show you how to download your device.     This is not a pacemaker.  This LOOP RECORDER only allows [place] to monitor your heart on office visits or if you call in to our office.  Have Blood drawn on Tuesday order is on prescription.  SignedLeone Brand 03/03/2012, 2:58 PM

## 2012-03-04 LAB — GLUCOSE, CAPILLARY: Glucose-Capillary: 159 mg/dL — ABNORMAL HIGH (ref 70–99)

## 2012-03-04 NOTE — Op Note (Signed)
Wanda [name], Wanda [name]              ACCOUNT NO.:  [id number]  MEDICAL RECORD NO.:  [id number]  LOCATION:  CATH                         FACILITY:  [facility]  PHYSICIAN:  Doylene Canning. Ladona Ridgel, MD    DATE OF BIRTH:  Apr 03, 1946  DATE OF PROCEDURE:  03/01/2012 DATE OF DISCHARGE:                              OPERATIVE REPORT   PROCEDURE PERFORMED:  Epinephrine challenge followed by insertion of implantable loop recorder.  INTRODUCTION:  The patient is a 66 year old woman with a prolongation of the QT interval, and a remote history of syncope.  She was admitted to the hospital with palpitations and was found to have nonsustained polymorphic VT.  This is in the setting of hypokalemia.  She had previously been on high-dose diuretics.  She is now referred for epinephrine challenge, insertion of implantable loop recorder.  PROCEDURE:  After informed consent was obtained, the patient was taken to the diagnostic EP lab in a fasting state.  The patient was infused with intravenous epinephrine.  Initially after measurement of the baseline QT interval, 0.025 mcg/kg per minute were infused.  There was no appreciable change in the QT interval.  After 10 minutes of this dose, 0.05 mcg/kg per minute was infused for over 5 minutes and again there was no significant change in the QT interval.  Finally 0.1 mcg/kg per minute was infused and during infusion at this higher dose, the patient developed ventricular bigeminy.  Measurement of the QT interval was actually quite difficult, but there did appear to be prolongation of the QT interval in the setting of ventricular bigeminy.  It was difficult to measure the actual QT prolongation because the patient would have a PVC during the T-wave.  There was no sustained ventricular arrhythmias and for that matter there was no polymorphic VT demonstrated.  At this point, the epinephrine was discontinued and the patient was prepped for insertion of implantable loop  recorder.  After the patient was sedated with fentanyl and Versed, lidocaine was infiltrated into the left subpectoral region.  A 3-cm incision was carried out over this region.  Electrocautery was utilized to dissect down to the fascial plane.  Blunt dissection was then carried out with forceps.  The [employer] implantable loop recorder, serial number [id number] was placed in the subcutaneous pocket.  It was secured with silk suture.  The pocket was irrigated with antibiotic irrigation.  The incision was closed with 2-0 and 3-0 Vicryl.  Benzoin and Steri-Strips were painted on the skin, a pressure dressing applied, the patient was returned to her room in satisfactory condition.  COMPLICATIONS:  There were no immediate procedure complications.  RESULTS:  This demonstrates an epinephrine challenge, which demonstrated prolongation of the QT interval.  The exact duration was unclear, but there was at least a 60 millisecond prolongation of the QT interval also demonstrating development of ventricular bigeminy without inducible ventricular tachycardia.  Implantable loop recorder was successfully inserted as well.  The patient will be observed.  Final recommendations will be that the patient be discharged home on beta blockers, but not on calcium channel blockers, specifically metoprolol 25 mg twice daily.  She will need follow up in EP Clinic as well.  Doylene Canning. Ladona Ridgel, MD     GWT/MEDQ  D:  03/01/2012  T:  03/02/2012  Job:  [id number]  cc:   Thurmon Fair, MD

## 2012-04-15 ENCOUNTER — Emergency Department (HOSPITAL_COMMUNITY)
Admission: EM | Admit: 2012-04-15 | Discharge: 2012-04-15 | Disposition: A | Discharge status: Home / Self Care | Payer: PRIVATE HEALTH INSURANCE | Attending: Emergency Medicine | Admitting: Emergency Medicine

## 2012-04-15 ENCOUNTER — Encounter (HOSPITAL_COMMUNITY): Payer: Self-pay | Admitting: Cardiology

## 2012-04-15 ENCOUNTER — Emergency Department (HOSPITAL_COMMUNITY): Payer: PRIVATE HEALTH INSURANCE

## 2012-04-15 DIAGNOSIS — I252 Old myocardial infarction: Secondary | ICD-10-CM | POA: Insufficient documentation

## 2012-04-15 DIAGNOSIS — Z8659 Personal history of other mental and behavioral disorders: Secondary | ICD-10-CM | POA: Insufficient documentation

## 2012-04-15 DIAGNOSIS — E1129 Type 2 diabetes mellitus with other diabetic kidney complication: Secondary | ICD-10-CM | POA: Insufficient documentation

## 2012-04-15 DIAGNOSIS — Z8719 Personal history of other diseases of the digestive system: Secondary | ICD-10-CM | POA: Insufficient documentation

## 2012-04-15 DIAGNOSIS — Z8679 Personal history of other diseases of the circulatory system: Secondary | ICD-10-CM | POA: Insufficient documentation

## 2012-04-15 DIAGNOSIS — K219 Gastro-esophageal reflux disease without esophagitis: Secondary | ICD-10-CM | POA: Insufficient documentation

## 2012-04-15 DIAGNOSIS — Z794 Long term (current) use of insulin: Secondary | ICD-10-CM | POA: Insufficient documentation

## 2012-04-15 DIAGNOSIS — J3489 Other specified disorders of nose and nasal sinuses: Secondary | ICD-10-CM | POA: Insufficient documentation

## 2012-04-15 DIAGNOSIS — Z8709 Personal history of other diseases of the respiratory system: Secondary | ICD-10-CM | POA: Insufficient documentation

## 2012-04-15 DIAGNOSIS — Z90710 Acquired absence of both cervix and uterus: Secondary | ICD-10-CM | POA: Insufficient documentation

## 2012-04-15 DIAGNOSIS — Z9851 Tubal ligation status: Secondary | ICD-10-CM | POA: Insufficient documentation

## 2012-04-15 DIAGNOSIS — E039 Hypothyroidism, unspecified: Secondary | ICD-10-CM | POA: Insufficient documentation

## 2012-04-15 DIAGNOSIS — D649 Anemia, unspecified: Secondary | ICD-10-CM | POA: Insufficient documentation

## 2012-04-15 DIAGNOSIS — Z7982 Long term (current) use of aspirin: Secondary | ICD-10-CM | POA: Insufficient documentation

## 2012-04-15 DIAGNOSIS — Z79899 Other long term (current) drug therapy: Secondary | ICD-10-CM | POA: Insufficient documentation

## 2012-04-15 DIAGNOSIS — Z87891 Personal history of nicotine dependence: Secondary | ICD-10-CM | POA: Insufficient documentation

## 2012-04-15 DIAGNOSIS — Z8739 Personal history of other diseases of the musculoskeletal system and connective tissue: Secondary | ICD-10-CM | POA: Insufficient documentation

## 2012-04-15 DIAGNOSIS — I509 Heart failure, unspecified: Secondary | ICD-10-CM | POA: Insufficient documentation

## 2012-04-15 DIAGNOSIS — I2510 Atherosclerotic heart disease of native coronary artery without angina pectoris: Secondary | ICD-10-CM | POA: Insufficient documentation

## 2012-04-15 DIAGNOSIS — J45909 Unspecified asthma, uncomplicated: Secondary | ICD-10-CM | POA: Insufficient documentation

## 2012-04-15 DIAGNOSIS — J40 Bronchitis, not specified as acute or chronic: Secondary | ICD-10-CM

## 2012-04-15 DIAGNOSIS — I129 Hypertensive chronic kidney disease with stage 1 through stage 4 chronic kidney disease, or unspecified chronic kidney disease: Secondary | ICD-10-CM | POA: Insufficient documentation

## 2012-04-15 DIAGNOSIS — Z8701 Personal history of pneumonia (recurrent): Secondary | ICD-10-CM | POA: Insufficient documentation

## 2012-04-15 DIAGNOSIS — Z9861 Coronary angioplasty status: Secondary | ICD-10-CM | POA: Insufficient documentation

## 2012-04-15 DIAGNOSIS — E669 Obesity, unspecified: Secondary | ICD-10-CM | POA: Insufficient documentation

## 2012-04-15 HISTORY — DX: Unspecified atrial fibrillation: I48.91

## 2012-04-15 LAB — BASIC METABOLIC PANEL
Calcium: 9.5 mg/dL (ref 8.4–10.5)
GFR calc Af Amer: 25 mL/min — ABNORMAL LOW (ref >90)
GFR calc non Af Amer: 22 mL/min — ABNORMAL LOW (ref >90)
Potassium: 4.1 mEq/L (ref 3.5–5.1)
Sodium: 145 mEq/L (ref 135–145)

## 2012-04-15 LAB — CBC
Hemoglobin: 10.9 g/dL — ABNORMAL LOW (ref 12.0–15.0)
MCHC: 32.2 g/dL (ref 30.0–36.0)
Platelets: 235 10*3/uL (ref 150–400)
RDW: 14.7 % (ref 11.5–15.5)

## 2012-04-15 MED ORDER — AMOXICILLIN-POT CLAVULANATE 875-125 MG PO TABS: 1.0000 | ORAL_TABLET | Freq: Once | ORAL | Status: DC

## 2012-04-15 MED ORDER — HYDROCOD POLST-CHLORPHEN POLST 10-8 MG/5ML PO LQCR
5.0000 mL | Freq: Once | ORAL | Status: AC
  Administered 2012-04-15: 5 mL via ORAL
  Filled 2012-04-15: qty 5

## 2012-04-15 MED ORDER — AMOXICILLIN-POT CLAVULANATE 875-125 MG PO TABS
1.0000 | ORAL_TABLET | Freq: Once | ORAL | Status: AC
  Administered 2012-04-15: 1 via ORAL
  Filled 2012-04-15: qty 1

## 2012-04-15 MED ORDER — HYDROCOD POLST-CHLORPHEN POLST 10-8 MG/5ML PO LQCR: 5.0000 mL | Freq: Once | ORAL | Status: DC

## 2012-04-15 NOTE — ED Notes (Signed)
Pt reports a cough and congestion for the past 3 days. States increased SOB with activity and fevers at home. Denies any chest pain, but recently had a loop monitor placed by her cardiologist.

## 2012-04-15 NOTE — ED Provider Notes (Signed)
History     CSN: [id number]  Arrival date & time 04/15/12  1217   First MD Initiated Contact with Patient 04/15/12 1526      Chief Complaint  Patient presents with  . Cough  . Nasal Congestion     HPI Pt reports a cough and congestion for the past 3 days. States increased SOB with activity and fevers at home. Denies any chest pain, but recently had a loop monitor placed by her cardiologist.   Past Medical History  Diagnosis Date  . Palpitations   . History of angina   . HTN (hypertension)   . DM (diabetes mellitus)   . CAD (coronary artery disease)   . Bigeminy   . Obesity   . Renal failure   . Spinal headache   . PONV (postoperative nausea and vomiting)   . Adverse effect of anesthesia 05/2009    MI during cardiac  stent placement  . Angina   . Myocardial infarction 05/2009  . Heart murmur   . Asthma   . Shortness of breath     "w/exertion"  . Hypothyroidism     "started med 01/2011"  . Blood transfusion   . Anemia 02/14/11     "I have taken shots for it; I take iron pills now"  . CHF (congestive heart failure)   . Recurrent upper respiratory infection (URI)     "colds every year"  . Recurrent upper respiratory infection (URI)   . Pneumonia     last time "winter 2011"  . Hiatal hernia   . Depression      "lost son 03/2010"  . GERD (gastroesophageal reflux disease)   . Arthritis   . 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 Atrial fibrillation     Past Surgical History  Procedure Date  . Cardiac stents. 2010; 05/2009  . Lt vats jan 2012   . Tubal ligation   . Cardiac catheterization     last stent 05/2009; "had MI during this"  . Coronary angioplasty with stent placement 05/2009  . Coronary angioplasty with stent placement 2010  . Abdominal hysterectomy ~ 1986  . Diagnostic laparoscopy post 1986    ovaries removed   . Eye surgery ~ 2009    laser tx   . Fracture surgery 2002    right ankle  . Dilation and curettage of uterus      Family History  Problem Relation Age of Onset  . Cancer      History  Substance Use Topics  . Smoking status: Former Smoker    Quit date: 03/10/1998  . Smokeless tobacco: Never Used  . Alcohol Use: No     Comment: "quit early 1990's"    OB History    Grav Para Term Preterm Abortions TAB SAB Ect Mult Living                  Review of Systems All other systems reviewed and are negative Allergies  Blue dyes (parenteral) and Erythromycin  Home Medications   Current Outpatient Rx  Name  Route  Sig  Dispense  Refill  . ACETAMINOPHEN-CODEINE #3 300-30 MG PO TABS   Oral   Take 1 tablet by mouth every 4 (four) hours as needed for pain.   30 tablet   0   . ALBUTEROL SULFATE HFA 108 (90 BASE) MCG/ACT IN AERS   Inhalation   Inhale 2 puffs into the lungs every 6 (six) hours as needed. For shortness  of breath/wheezing          . ASPIRIN 81 MG PO TABS   Oral   Take 81 mg by mouth daily.           . ATORVASTATIN CALCIUM 10 MG PO TABS   Oral   Take 1 tablet (10 mg total) by mouth daily at 6 PM.   30 tablet   6   . BIMATOPROST 0.01 % OP SOLN   Both Eyes   Place 1 drop into both eyes at bedtime.          Marland Kitchen BRIMONIDINE TARTRATE 0.15 % OP SOLN   Both Eyes   Place 1 drop into both eyes 2 (two) times daily.          Marland Kitchen CLOPIDOGREL BISULFATE 75 MG PO TABS   Oral   Take 75 mg by mouth every morning.          . CYCLOBENZAPRINE HCL 5 MG PO TABS   Oral   Take 5 mg by mouth 3 (three) times daily as needed. For muscle spasms         . FERROUS SULFATE 325 (65 FE) MG PO TABS   Oral   Take 325 mg by mouth 2 (two) times daily.           Marland Kitchen FLUTICASONE-SALMETEROL 500-50 MCG/DOSE IN AEPB   Inhalation   Inhale 1 puff into the lungs every 12 (twelve) hours.         . FUROSEMIDE 40 MG PO TABS   Oral   Take 1 tablet (40 mg total) by mouth daily.   30 tablet   6   . HYDRALAZINE HCL 50 MG PO TABS   Oral   Take 1 tablet (50 mg total) by mouth every 8 (eight)  hours.   90 tablet   6   . INSULIN GLARGINE 100 UNIT/ML [place] SOLN   Subcutaneous   Inject 25 Units into the skin daily after breakfast.          . INSULIN LISPRO (HUMAN) 100 UNIT/ML [place] SOLN   Subcutaneous   Inject 2-14 Units into the skin 3 (three) times daily between meals as needed. sliding scale-150-175=2 units, 176-200=4 units, 201-225=6 units, 226-250=8 units, 251-300=10 units, 301-350=12 units & >350=14 units & contact doctor         . ISOSORBIDE MONONITRATE ER 60 MG PO TB24   Oral   Take 1 tablet (60 mg total) by mouth daily.   30 tablet   11   . LEVOTHYROXINE SODIUM 100 MCG PO TABS   Oral   Take 100 mcg by mouth daily.         Marland Kitchen MECLIZINE HCL 25 MG PO TABS   Oral   Take 25 mg by mouth 3 (three) times daily as needed. For dizziness         . METOLAZONE 5 MG PO TABS   Oral   Take 5 mg by mouth daily as needed. Only takes for a weight gain of 5lbs         . NEBIVOLOL HCL 5 MG PO TABS   Oral   Take 1 tablet (5 mg total) by mouth daily.   30 tablet   6   . NITROGLYCERIN 0.4 MG SL SUBL   Sublingual   Place 0.4 mg under the tongue every 5 (five) minutes as needed. For chest pain          . OMEPRAZOLE 20 MG PO CPDR  Oral   Take 20 mg by mouth every morning.          Marland Kitchen ONDANSETRON 8 MG PO TBDP   Oral   Take 8 mg by mouth every 6 (six) hours as needed. For nausea         . POTASSIUM CHLORIDE CRYS ER 20 MEQ PO TBCR   Oral   Take 1 tablet (20 mEq total) by mouth 2 (two) times daily.   60 tablet   6   . AMOXICILLIN-POT CLAVULANATE 875-125 MG PO TABS   Oral   Take 1 tablet by mouth once.   10 tablet   0   . HYDROCOD POLST-CPM POLST ER 10-8 MG/5ML PO LQCR   Oral   Take 5 mLs by mouth once.   140 mL   0     BP 181/79  Pulse 59  Temp 97.8 F (36.6 C) (Oral)  Resp 20  SpO2 94%  Physical Exam  Nursing note and vitals reviewed. Constitutional: She is oriented to person, place, and time. She appears well-developed and well-nourished. No  distress.  HENT:  Head: Normocephalic and atraumatic.  Eyes: Pupils are equal, round, and reactive to light.  Neck: Normal range of motion.  Cardiovascular: Normal rate and intact distal pulses.   Pulmonary/Chest: No respiratory distress. She has wheezes.  Abdominal: Normal appearance. She exhibits no distension.  Musculoskeletal: Normal range of motion.  Neurological: She is alert and oriented to person, place, and time. No cranial nerve deficit.  Skin: Skin is warm and dry. No rash noted.  Psychiatric: She has a normal mood and affect. Her behavior is normal.    ED Course  Procedures (including critical care time)  Medications  amoxicillin-clavulanate (AUGMENTIN) 875-125 MG per tablet (not administered)  chlorpheniramine-HYDROcodone (TUSSIONEX) 10-8 MG/5ML LQCR (not administered)  amoxicillin-clavulanate (AUGMENTIN) 875-125 MG per tablet 1 tablet (1 tablet Oral Given 04/15/12 1936)  chlorpheniramine-HYDROcodone (TUSSIONEX) 10-8 MG/5ML suspension 5 mL (5 mL Oral Given 04/15/12 1936)    Labs Reviewed  CBC - Abnormal; Notable for the following:    RBC 3.69 (*)     Hemoglobin 10.9 (*)     HCT 33.9 (*)     All other components within normal limits  BASIC METABOLIC PANEL - Abnormal; Notable for the following:    Glucose, Bld 196 (*)     BUN 42 (*)     Creatinine, Ser 2.25 (*)     GFR calc non Af Amer 22 (*)     GFR calc Af Amer 25 (*)     All other components within normal limits   Dg Chest 2 View  04/15/2012  *RADIOLOGY REPORT*  Clinical Data: Cough.  Weakness.  History of CHF.  Prior smoker.  CHEST - 2 VIEW  Comparison: 02/27/2012 and 01/03/2011.  Findings: Prior left lung surgery with rib deformity and elevation left hemidiaphragm.  Mild elevation left hilar structures with similar configuration to prior exams.  Loop recorder is in place projecting over the heart.  Pulmonary vascular congestion similar to the prior exam most notable centrally.  No segmental consolidation or  pneumothorax.  Heart size top normal.  Vascular calcifications.  IMPRESSION: Postsurgical changes left lung appear relatively similar to the prior exam.  Pulmonary vascular congestion most notable centrally.   Original Report Authenticated By: Lacy Duverney, M.D.      1. Bronchitis       MDM          Nelia Shi, MD 04/17/12 [phone]

## 2012-05-20 ENCOUNTER — Encounter (HOSPITAL_COMMUNITY): Payer: Self-pay | Admitting: Emergency Medicine

## 2012-05-20 ENCOUNTER — Inpatient Hospital Stay (HOSPITAL_COMMUNITY)
Admission: EM | Admit: 2012-05-20 | Discharge: 2012-05-23 | DRG: 194 | Disposition: A | Discharge status: Home Health | Payer: PRIVATE HEALTH INSURANCE | Attending: Internal Medicine | Admitting: Internal Medicine

## 2012-05-20 ENCOUNTER — Emergency Department (HOSPITAL_COMMUNITY): Payer: PRIVATE HEALTH INSURANCE

## 2012-05-20 DIAGNOSIS — I480 Paroxysmal atrial fibrillation: Secondary | ICD-10-CM

## 2012-05-20 DIAGNOSIS — R55 Syncope and collapse: Secondary | ICD-10-CM

## 2012-05-20 DIAGNOSIS — I252 Old myocardial infarction: Secondary | ICD-10-CM

## 2012-05-20 DIAGNOSIS — E669 Obesity, unspecified: Secondary | ICD-10-CM

## 2012-05-20 DIAGNOSIS — I129 Hypertensive chronic kidney disease with stage 1 through stage 4 chronic kidney disease, or unspecified chronic kidney disease: Secondary | ICD-10-CM | POA: Diagnosis present

## 2012-05-20 DIAGNOSIS — I5022 Chronic systolic (congestive) heart failure: Secondary | ICD-10-CM | POA: Diagnosis present

## 2012-05-20 DIAGNOSIS — I2510 Atherosclerotic heart disease of native coronary artery without angina pectoris: Secondary | ICD-10-CM

## 2012-05-20 DIAGNOSIS — I509 Heart failure, unspecified: Secondary | ICD-10-CM | POA: Diagnosis present

## 2012-05-20 DIAGNOSIS — E785 Hyperlipidemia, unspecified: Secondary | ICD-10-CM

## 2012-05-20 DIAGNOSIS — Z7982 Long term (current) use of aspirin: Secondary | ICD-10-CM

## 2012-05-20 DIAGNOSIS — E039 Hypothyroidism, unspecified: Secondary | ICD-10-CM | POA: Diagnosis present

## 2012-05-20 DIAGNOSIS — I5032 Chronic diastolic (congestive) heart failure: Secondary | ICD-10-CM | POA: Diagnosis present

## 2012-05-20 DIAGNOSIS — Z7902 Long term (current) use of antithrombotics/antiplatelets: Secondary | ICD-10-CM

## 2012-05-20 DIAGNOSIS — Z794 Long term (current) use of insulin: Secondary | ICD-10-CM

## 2012-05-20 DIAGNOSIS — I4891 Unspecified atrial fibrillation: Secondary | ICD-10-CM | POA: Diagnosis present

## 2012-05-20 DIAGNOSIS — K449 Diaphragmatic hernia without obstruction or gangrene: Secondary | ICD-10-CM | POA: Diagnosis present

## 2012-05-20 DIAGNOSIS — J45909 Unspecified asthma, uncomplicated: Secondary | ICD-10-CM | POA: Diagnosis present

## 2012-05-20 DIAGNOSIS — J189 Pneumonia, unspecified organism: Principal | ICD-10-CM

## 2012-05-20 DIAGNOSIS — I493 Ventricular premature depolarization: Secondary | ICD-10-CM

## 2012-05-20 DIAGNOSIS — R002 Palpitations: Secondary | ICD-10-CM

## 2012-05-20 DIAGNOSIS — M542 Cervicalgia: Secondary | ICD-10-CM | POA: Diagnosis present

## 2012-05-20 DIAGNOSIS — E876 Hypokalemia: Secondary | ICD-10-CM

## 2012-05-20 DIAGNOSIS — E1159 Type 2 diabetes mellitus with other circulatory complications: Secondary | ICD-10-CM

## 2012-05-20 DIAGNOSIS — Z91041 Radiographic dye allergy status: Secondary | ICD-10-CM

## 2012-05-20 DIAGNOSIS — Z79899 Other long term (current) drug therapy: Secondary | ICD-10-CM

## 2012-05-20 DIAGNOSIS — I4721 Torsades de pointes: Secondary | ICD-10-CM

## 2012-05-20 DIAGNOSIS — E66811 Obesity, class 1: Secondary | ICD-10-CM

## 2012-05-20 DIAGNOSIS — I472 Ventricular tachycardia: Secondary | ICD-10-CM

## 2012-05-20 DIAGNOSIS — R079 Chest pain, unspecified: Secondary | ICD-10-CM

## 2012-05-20 DIAGNOSIS — K219 Gastro-esophageal reflux disease without esophagitis: Secondary | ICD-10-CM | POA: Diagnosis present

## 2012-05-20 DIAGNOSIS — I10 Essential (primary) hypertension: Secondary | ICD-10-CM | POA: Diagnosis present

## 2012-05-20 DIAGNOSIS — R9431 Abnormal electrocardiogram [ECG] [EKG]: Secondary | ICD-10-CM

## 2012-05-20 DIAGNOSIS — E119 Type 2 diabetes mellitus without complications: Secondary | ICD-10-CM

## 2012-05-20 DIAGNOSIS — N183 Chronic kidney disease, stage 3 unspecified: Secondary | ICD-10-CM

## 2012-05-20 LAB — CBC WITH DIFFERENTIAL/PLATELET
Basophils Relative: 1 % (ref 0–1)
Hemoglobin: 11.9 g/dL — ABNORMAL LOW (ref 12.0–15.0)
Lymphs Abs: 1.6 10*3/uL (ref 0.7–4.0)
Monocytes Relative: 9 % (ref 3–12)
Neutro Abs: 4.2 10*3/uL (ref 1.7–7.7)
Neutrophils Relative %: 65 % (ref 43–77)
Platelets: 174 10*3/uL (ref 150–400)
RBC: 4.04 MIL/uL (ref 3.87–5.11)
WBC: 6.5 10*3/uL (ref 4.0–10.5)

## 2012-05-20 LAB — COMPREHENSIVE METABOLIC PANEL
ALT: 9 U/L (ref 0–35)
Albumin: 3.5 g/dL (ref 3.5–5.2)
Alkaline Phosphatase: 142 U/L — ABNORMAL HIGH (ref 39–117)
BUN: 49 mg/dL — ABNORMAL HIGH (ref 6–23)
Chloride: 106 mEq/L (ref 96–112)
Glucose, Bld: 233 mg/dL — ABNORMAL HIGH (ref 70–99)
Potassium: 3.6 mEq/L (ref 3.5–5.1)
Sodium: 143 mEq/L (ref 135–145)
Total Bilirubin: 0.3 mg/dL (ref 0.3–1.2)
Total Protein: 7.4 g/dL (ref 6.0–8.3)

## 2012-05-20 LAB — GLUCOSE, CAPILLARY: Glucose-Capillary: 253 mg/dL — ABNORMAL HIGH (ref 70–99)

## 2012-05-20 MED ORDER — HYDRALAZINE HCL 50 MG PO TABS
50.0000 mg | ORAL_TABLET | Freq: Three times a day (TID) | ORAL | Status: DC
  Administered 2012-05-20 – 2012-05-22 (×7): 50 mg via ORAL
  Filled 2012-05-20 (×15): qty 1

## 2012-05-20 MED ORDER — SODIUM CHLORIDE 0.9 % IJ SOLN: 3.0000 mL | INTRAMUSCULAR | Status: DC | PRN

## 2012-05-20 MED ORDER — FUROSEMIDE 40 MG PO TABS
40.0000 mg | ORAL_TABLET | Freq: Every day | ORAL | Status: DC
  Administered 2012-05-20 – 2012-05-23 (×4): 40 mg via ORAL
  Filled 2012-05-20 (×4): qty 1

## 2012-05-20 MED ORDER — MOMETASONE FURO-FORMOTEROL FUM 200-5 MCG/ACT IN AERO
2.0000 | INHALATION_SPRAY | Freq: Two times a day (BID) | RESPIRATORY_TRACT | Status: DC
  Administered 2012-05-21 – 2012-05-23 (×3): 2 via RESPIRATORY_TRACT
  Filled 2012-05-20: qty 8.8

## 2012-05-20 MED ORDER — SODIUM CHLORIDE 0.9 % IJ SOLN
3.0000 mL | Freq: Two times a day (BID) | INTRAMUSCULAR | Status: DC
  Administered 2012-05-21 – 2012-05-22 (×3): 3 mL via INTRAVENOUS

## 2012-05-20 MED ORDER — HEPARIN SODIUM (PORCINE) 5000 UNIT/ML IJ SOLN
5000.0000 [IU] | Freq: Three times a day (TID) | INTRAMUSCULAR | Status: DC
  Administered 2012-05-20 – 2012-05-23 (×8): 5000 [IU] via SUBCUTANEOUS
  Filled 2012-05-20 (×11): qty 1

## 2012-05-20 MED ORDER — CARBOXYMETH-GLYCERIN-POLYSORB 0.5-1-0.5 % OP SOLN: 1.0000 [drp] | Freq: Three times a day (TID) | OPHTHALMIC | Status: DC

## 2012-05-20 MED ORDER — ASPIRIN EC 81 MG PO TBEC
81.0000 mg | DELAYED_RELEASE_TABLET | Freq: Every day | ORAL | Status: DC
  Administered 2012-05-20 – 2012-05-23 (×4): 81 mg via ORAL
  Filled 2012-05-20 (×4): qty 1

## 2012-05-20 MED ORDER — INSULIN ASPART 100 UNIT/ML ~~LOC~~ SOLN
0.0000 [IU] | Freq: Three times a day (TID) | SUBCUTANEOUS | Status: DC
  Administered 2012-05-21: 3 [IU] via SUBCUTANEOUS
  Administered 2012-05-21 – 2012-05-22 (×4): 2 [IU] via SUBCUTANEOUS
  Administered 2012-05-22: 1 [IU] via SUBCUTANEOUS
  Administered 2012-05-23: 5 [IU] via SUBCUTANEOUS

## 2012-05-20 MED ORDER — BRIMONIDINE TARTRATE 0.2 % OP SOLN
1.0000 [drp] | Freq: Two times a day (BID) | OPHTHALMIC | Status: DC
  Administered 2012-05-20 – 2012-05-23 (×6): 1 [drp] via OPHTHALMIC
  Filled 2012-05-20: qty 5

## 2012-05-20 MED ORDER — ONDANSETRON HCL 4 MG PO TABS: 4.0000 mg | ORAL_TABLET | Freq: Four times a day (QID) | ORAL | Status: DC | PRN

## 2012-05-20 MED ORDER — POTASSIUM CHLORIDE CRYS ER 20 MEQ PO TBCR
20.0000 meq | EXTENDED_RELEASE_TABLET | Freq: Two times a day (BID) | ORAL | Status: DC
  Administered 2012-05-20 – 2012-05-23 (×6): 20 meq via ORAL
  Filled 2012-05-20 (×7): qty 1

## 2012-05-20 MED ORDER — BRIMONIDINE TARTRATE 0.15 % OP SOLN
1.0000 [drp] | Freq: Two times a day (BID) | OPHTHALMIC | Status: DC
  Filled 2012-05-20: qty 5

## 2012-05-20 MED ORDER — OSELTAMIVIR PHOSPHATE 75 MG PO CAPS
75.0000 mg | ORAL_CAPSULE | Freq: Two times a day (BID) | ORAL | Status: DC
  Administered 2012-05-20 – 2012-05-21 (×2): 75 mg via ORAL
  Filled 2012-05-20 (×3): qty 1

## 2012-05-20 MED ORDER — ISOSORBIDE MONONITRATE ER 60 MG PO TB24
60.0000 mg | ORAL_TABLET | Freq: Every day | ORAL | Status: DC
  Administered 2012-05-20 – 2012-05-23 (×4): 60 mg via ORAL
  Filled 2012-05-20 (×4): qty 1

## 2012-05-20 MED ORDER — OXYCODONE-ACETAMINOPHEN 5-325 MG PO TABS
2.0000 | ORAL_TABLET | Freq: Once | ORAL | Status: AC
  Administered 2012-05-20: 2 via ORAL
  Filled 2012-05-20: qty 2

## 2012-05-20 MED ORDER — NITROGLYCERIN 0.4 MG SL SUBL: 0.4000 mg | SUBLINGUAL_TABLET | SUBLINGUAL | Status: DC | PRN

## 2012-05-20 MED ORDER — SODIUM CHLORIDE 0.9 % IV SOLN: 250.0000 mL | INTRAVENOUS | Status: DC | PRN

## 2012-05-20 MED ORDER — LEVOFLOXACIN IN D5W 750 MG/150ML IV SOLN
750.0000 mg | Freq: Once | INTRAVENOUS | Status: AC
  Administered 2012-05-20: 750 mg via INTRAVENOUS
  Filled 2012-05-20: qty 150

## 2012-05-20 MED ORDER — ONDANSETRON HCL 4 MG/2ML IJ SOLN: 4.0000 mg | Freq: Four times a day (QID) | INTRAMUSCULAR | Status: DC | PRN

## 2012-05-20 MED ORDER — ALBUTEROL SULFATE HFA 108 (90 BASE) MCG/ACT IN AERS
2.0000 | INHALATION_SPRAY | Freq: Four times a day (QID) | RESPIRATORY_TRACT | Status: DC | PRN
  Filled 2012-05-20: qty 6.7

## 2012-05-20 MED ORDER — BIMATOPROST 0.01 % OP SOLN
1.0000 [drp] | Freq: Every day | OPHTHALMIC | Status: DC
  Administered 2012-05-20 – 2012-05-22 (×3): 1 [drp] via OPHTHALMIC
  Filled 2012-05-20: qty 2.5

## 2012-05-20 MED ORDER — PANTOPRAZOLE SODIUM 40 MG PO TBEC
40.0000 mg | DELAYED_RELEASE_TABLET | Freq: Every day | ORAL | Status: DC
  Administered 2012-05-20 – 2012-05-23 (×4): 40 mg via ORAL
  Filled 2012-05-20 (×4): qty 1

## 2012-05-20 MED ORDER — GUAIFENESIN-CODEINE 100-10 MG/5ML PO SOLN: 10.0000 mL | Freq: Four times a day (QID) | ORAL | Status: DC | PRN

## 2012-05-20 MED ORDER — NEBIVOLOL HCL 5 MG PO TABS
5.0000 mg | ORAL_TABLET | Freq: Every day | ORAL | Status: DC
  Administered 2012-05-20 – 2012-05-23 (×4): 5 mg via ORAL
  Filled 2012-05-20 (×4): qty 1

## 2012-05-20 MED ORDER — DEXTROSE 5 % IV SOLN
1.0000 g | Freq: Two times a day (BID) | INTRAVENOUS | Status: DC
  Administered 2012-05-20 – 2012-05-22 (×4): 1 g via INTRAVENOUS
  Filled 2012-05-20 (×6): qty 1

## 2012-05-20 MED ORDER — CLOPIDOGREL BISULFATE 75 MG PO TABS
75.0000 mg | ORAL_TABLET | ORAL | Status: DC
  Administered 2012-05-21 – 2012-05-23 (×3): 75 mg via ORAL
  Filled 2012-05-20 (×4): qty 1

## 2012-05-20 MED ORDER — ATORVASTATIN CALCIUM 10 MG PO TABS
10.0000 mg | ORAL_TABLET | Freq: Every day | ORAL | Status: DC
  Administered 2012-05-22: 10 mg via ORAL
  Filled 2012-05-20 (×3): qty 1

## 2012-05-20 MED ORDER — HYDROCODONE-ACETAMINOPHEN 5-325 MG PO TABS
1.0000 | ORAL_TABLET | ORAL | Status: DC | PRN
  Administered 2012-05-21 – 2012-05-22 (×4): 2 via ORAL
  Filled 2012-05-20 (×5): qty 2

## 2012-05-20 MED ORDER — POLYVINYL ALCOHOL 1.4 % OP SOLN
1.0000 [drp] | Freq: Three times a day (TID) | OPHTHALMIC | Status: DC
  Administered 2012-05-21 – 2012-05-23 (×6): 1 [drp] via OPHTHALMIC
  Filled 2012-05-20 (×2): qty 15

## 2012-05-20 NOTE — ED Provider Notes (Signed)
History     CSN: [id number]  Arrival date & time 05/20/12  1453   First MD Initiated Contact with Patient 05/20/12 1721      No chief complaint on file.   (Consider location/radiation/quality/duration/timing/severity/associated sxs/prior treatment) HPI Comments: This is a 67 year old female, past medical history remarkable for hypertension, diabetes, and chronic renal failure, who presents emergency department with chief complaint of cough, sore throat, and generalized body aches. Patient states that she has been feeling sick for the past week. She states that she did not have a flu shot this year. Patient states that her cough is causing her muscles to be sore. She states that she is in moderate pain. She has not tried taking anything to alleviate her symptoms. Patient was recently hospitalized in the end of November 2013.  Past surgical history remarkable for left VATS procedure in 2012.  The history is provided by the patient. No language interpreter was used.    Past Medical History  Diagnosis Date  . Palpitations   . History of angina   . HTN (hypertension)   . DM (diabetes mellitus)   . CAD (coronary artery disease)   . Bigeminy   . Obesity   . Renal failure   . Spinal headache   . PONV (postoperative nausea and vomiting)   . Adverse effect of anesthesia 05/2009    MI during cardiac  stent placement  . Angina   . Myocardial infarction 05/2009  . Heart murmur   . Asthma   . Shortness of breath     "w/exertion"  . Hypothyroidism     "started med 01/2011"  . Blood transfusion   . Anemia 02/14/11     "I have taken shots for it; I take iron pills now"  . CHF (congestive heart failure)   . Recurrent upper respiratory infection (URI)     "colds every year"  . Recurrent upper respiratory infection (URI)   . Pneumonia     last time "winter 2011"  . Hiatal hernia   . Depression      "lost son 03/2010"  . GERD (gastroesophageal reflux disease)   . Arthritis   .  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 Atrial fibrillation     Past Surgical History  Procedure Laterality Date  . Cardiac stents.  2010; 05/2009  . Lt vats jan 2012    . Tubal ligation    . Cardiac catheterization      last stent 05/2009; "had MI during this"  . Coronary angioplasty with stent placement  05/2009  . Coronary angioplasty with stent placement  2010  . Abdominal hysterectomy  ~ 1986  . Diagnostic laparoscopy  post 1986    ovaries removed   . Eye surgery  ~ 2009    laser tx   . Fracture surgery  2002    right ankle  . Dilation and curettage of uterus      Family History  Problem Relation Age of Onset  . Cancer      History  Substance Use Topics  . Smoking status: Former Smoker    Quit date: 03/10/1998  . Smokeless tobacco: Never Used  . Alcohol Use: No     Comment: "quit early 1990's"    OB History   Grav Para Term Preterm Abortions TAB SAB Ect Mult Living                  Review of Systems  All other systems  reviewed and are negative.    Allergies  Blue dyes (parenteral) and Erythromycin  Home Medications   Current Outpatient Rx  Name  Route  Sig  Dispense  Refill  . albuterol (PROVENTIL HFA;VENTOLIN HFA) 108 (90 BASE) MCG/ACT inhaler   Inhalation   Inhale 2 puffs into the lungs every 6 (six) hours as needed. For shortness of breath/wheezing          . atorvastatin (LIPITOR) 10 MG tablet   Oral   Take 1 tablet (10 mg total) by mouth daily at 6 PM.   30 tablet   6   . Bimatoprost (LUMIGAN) 0.01 % SOLN   Both Eyes   Place 1 drop into both eyes at bedtime.          . brimonidine (ALPHAGAN) 0.15 % ophthalmic solution   Both Eyes   Place 1 drop into both eyes 2 (two) times daily.          . furosemide (LASIX) 40 MG tablet   Oral   Take 1 tablet (40 mg total) by mouth daily.   30 tablet   6   . nebivolol (BYSTOLIC) 5 MG tablet   Oral   Take 1 tablet (5 mg total) by mouth daily.   30 tablet   6   .  potassium chloride SA (K-DUR,KLOR-CON) 20 MEQ tablet   Oral   Take 1 tablet (20 mEq total) by mouth 2 (two) times daily.   60 tablet   6   . aspirin 81 MG tablet   Oral   Take 81 mg by mouth daily.           . clopidogrel (PLAVIX) 75 MG tablet   Oral   Take 75 mg by mouth every morning.          . cyclobenzaprine (FLEXERIL) 5 MG tablet   Oral   Take 5 mg by mouth 3 (three) times daily as needed. For muscle spasms         . ferrous sulfate 325 (65 FE) MG tablet   Oral   Take 325 mg by mouth 2 (two) times daily.           . Fluticasone-Salmeterol (ADVAIR) 500-50 MCG/DOSE AEPB   Inhalation   Inhale 1 puff into the lungs every 12 (twelve) hours.         . hydrALAZINE (APRESOLINE) 50 MG tablet   Oral   Take 1 tablet (50 mg total) by mouth every 8 (eight) hours.   90 tablet   6   . insulin glargine (LANTUS) 100 UNIT/ML injection   Subcutaneous   Inject 25 Units into the skin daily after breakfast.          . insulin lispro (HUMALOG) 100 UNIT/ML injection   Subcutaneous   Inject 2-14 Units into the skin 3 (three) times daily between meals as needed. sliding scale-150-175=2 units, 176-200=4 units, 201-225=6 units, 226-250=8 units, 251-300=10 units, 301-350=12 units & >350=14 units & contact doctor         . isosorbide mononitrate (IMDUR) 60 MG 24 hr tablet   Oral   Take 1 tablet (60 mg total) by mouth daily.   30 tablet   11   . levothyroxine (SYNTHROID, LEVOTHROID) 100 MCG tablet   Oral   Take 100 mcg by mouth daily.         . meclizine (ANTIVERT) 25 MG tablet   Oral   Take 25 mg by mouth 3 (three) times daily as  needed. For dizziness         . metolazone (ZAROXOLYN) 5 MG tablet   Oral   Take 5 mg by mouth daily as needed. Only takes for a weight gain of 5lbs         . nitroGLYCERIN (NITROSTAT) 0.4 MG SL tablet   Sublingual   Place 0.4 mg under the tongue every 5 (five) minutes as needed. For chest pain          . omeprazole (PRILOSEC) 20  MG capsule   Oral   Take 20 mg by mouth every morning.          . ondansetron (ZOFRAN-ODT) 8 MG disintegrating tablet   Oral   Take 8 mg by mouth every 6 (six) hours as needed. For nausea           BP 195/81  Pulse 67  Temp(Src) 98.1 F (36.7 C) (Oral)  Resp 18  SpO2 95%  Physical Exam  Nursing note and vitals reviewed. Constitutional: She is oriented to person, place, and time. She appears well-developed and well-nourished.  HENT:  Head: Normocephalic and atraumatic.  Dry appearing, right TM not visualized secondary to cerumen impaction, left TM is clear  Eyes: Conjunctivae and EOM are normal. Pupils are equal, round, and reactive to light.  Neck: Normal range of motion. Neck supple.  Cardiovascular: Normal rate and regular rhythm.  Exam reveals no gallop and no friction rub.   No murmur heard. Pulmonary/Chest: Effort normal and breath sounds normal. No respiratory distress. She has no wheezes. She has no rales. She exhibits no tenderness.  Abdominal: Soft. Bowel sounds are normal. She exhibits no distension and no mass. There is no tenderness. There is no rebound and no guarding.  Musculoskeletal: Normal range of motion. She exhibits no edema and no tenderness.  Neurological: She is alert and oriented to person, place, and time.  Skin: Skin is warm and dry.  Psychiatric: She has a normal mood and affect. Her behavior is normal. Judgment and thought content normal.    ED Course  Procedures (including critical care time)  Labs Reviewed  CBC WITH DIFFERENTIAL - Abnormal; Notable for the following:    Hemoglobin 11.9 (*)    HCT 35.5 (*)    All other components within normal limits  COMPREHENSIVE METABOLIC PANEL - Abnormal; Notable for the following:    Glucose, Bld 233 (*)    BUN 49 (*)    Creatinine, Ser 1.96 (*)    Alkaline Phosphatase 142 (*)    GFR calc non Af Amer 25 (*)    GFR calc Af Amer 29 (*)    All other components within normal limits  GLUCOSE,  CAPILLARY - Abnormal; Notable for the following:    Glucose-Capillary 253 (*)    All other components within normal limits   Dg Chest 2 View  05/20/2012  *RADIOLOGY REPORT*  Clinical Data: Cough.  Headache.  Body gait.  Hypertension and diabetes.  CHEST - 2 VIEW  Comparison: 04/15/2012 and multiple previous  Findings: The heart is at the upper limits of normal in size.  The aorta is unfolded.  There is been previous chest surgery on the left.  There is chronic elevation of the left hemidiaphragm.  There may be minimal patchy perihilar infiltrate/atelectasis.  No dense consolidation, collapse or effusion.  IMPRESSION: I think there are patchy densities in the perihilar regions bilaterally suggesting mild pneumonia.  No advanced disease.   Original Report Authenticated By: Paulina Fusi, M.D.  1. HCAP (healthcare-associated pneumonia)       MDM  67 year old female with pneumonia. I've discussed this patient with Dr. Rubin Payor, and he tells me that the patient likely need to be admitted to the hospitalist team. He will start patient on cefepime and levofloxacin. I've given the patient some Percocet for her generalized body aches and sore muscles.  7:24 PM Discuss the patient with the hospitalist team, Dr. Eben Burow, will admit the patient. Patient understands and agrees with the plan. She is stable and ready to be admitted.        Roxy Horseman, PA-C 05/20/12 1924

## 2012-05-20 NOTE — H&P (Addendum)
History and Physical  Wanda [name] ZOX:[id number] DOB: [birth year] DOA: 05/20/2012  Referring physician: Dr Rubin Payor PCP: Patient is not sure  Chief Complaint: Shortness of breath  HPI:  Patient is 67 year old female with past medical history as noted below most significant for diabetes, coronary artery disease, hypertension and palpitations who comes in to the hospital with chief complaints of neck pain, and progressive shortness of breath. Patient said that she has been coughing for last 1 week. Cough has been described as dry. She also complains of intermittent fever as high as 104. Cough is associated with shortness of breath and decreased appetite. Patient also complains of neck pain and whole body ache. Patient also complains of earache bilaterally. Patient has not had flu shot this season. No other complaints at this time.   Review of Systems:  10 point review of system was done and is negative except what is noted in history of present illness.   Past Medical History  Diagnosis Date  . Palpitations   . History of angina   . HTN (hypertension)   . DM (diabetes mellitus)   . CAD (coronary artery disease)   . Bigeminy   . Obesity   . Renal failure   . Spinal headache   . PONV (postoperative nausea and vomiting)   . Adverse effect of anesthesia 05/2009    MI during cardiac  stent placement  . Angina   . Myocardial infarction 05/2009  . Heart murmur   . Asthma   . Shortness of breath     "w/exertion"  . Hypothyroidism     "started med 01/2011"  . Blood transfusion   . Anemia 02/14/11     "I have taken shots for it; I take iron pills now"  . CHF (congestive heart failure)   . Recurrent upper respiratory infection (URI)     "colds every year"  . Recurrent upper respiratory infection (URI)   . Pneumonia     last time "winter 2011"  . Hiatal hernia   . Depression      "lost son 03/2010"  . GERD (gastroesophageal reflux disease)   . Arthritis   . 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 Atrial fibrillation     Past Surgical History  Procedure Laterality Date  . Cardiac stents.  2010; 05/2009  . Lt vats jan 2012    . Tubal ligation    . Cardiac catheterization      last stent 05/2009; "had MI during this"  . Coronary angioplasty with stent placement  05/2009  . Coronary angioplasty with stent placement  2010  . Abdominal hysterectomy  ~ 1986  . Diagnostic laparoscopy  post 1986    ovaries removed   . Eye surgery  ~ 2009    laser tx   . Fracture surgery  2002    right ankle  . Dilation and curettage of uterus      Social History:  reports that she quit smoking about 14 years ago. She has never used smokeless tobacco. She reports that she does not drink alcohol or use illicit drugs.  Allergies  Allergen Reactions  . Blue Dyes (Parenteral) Swelling       . Erythromycin Palpitations    Family History  Problem Relation Age of Onset  . Cancer       Prior to Admission medications   Medication Sig Start Date End Date Taking? Authorizing Provider  albuterol (PROVENTIL HFA;VENTOLIN HFA) 108 (90 BASE) MCG/ACT inhaler  Inhale 2 puffs into the lungs every 6 (six) hours as needed. For shortness of breath/wheezing    Yes Historical Provider, MD  aspirin EC 81 MG tablet Take 81 mg by mouth daily.   Yes Historical Provider, MD  atorvastatin (LIPITOR) 10 MG tablet Take 1 tablet (10 mg total) by mouth daily at 6 PM. 03/03/12  Yes Nada Boozer, NP  Bimatoprost (LUMIGAN) 0.01 % SOLN Place 1 drop into both eyes at bedtime.    Yes Historical Provider, MD  brimonidine (ALPHAGAN) 0.15 % ophthalmic solution Place 1 drop into both eyes 2 (two) times daily.    Yes Historical Provider, MD  Carboxymeth-Glycerin-Polysorb (REFRESH OPTIVE ADVANCED) 0.5-1-0.5 % SOLN Place 1 drop into both eyes 3 (three) times daily.   Yes Historical Provider, MD  clopidogrel (PLAVIX) 75 MG tablet Take 75 mg by mouth every morning.    Yes Historical Provider,  MD  cyclobenzaprine (FLEXERIL) 5 MG tablet Take 5 mg by mouth 2 (two) times daily as needed. For muscle spasms   Yes Historical Provider, MD  ferrous sulfate 325 (65 FE) MG tablet Take 325 mg by mouth 2 (two) times daily.     Yes Historical Provider, MD  Fluticasone-Salmeterol (ADVAIR) 500-50 MCG/DOSE AEPB Inhale 1 puff into the lungs every 12 (twelve) hours.   Yes Historical Provider, MD  furosemide (LASIX) 40 MG tablet Take 1 tablet (40 mg total) by mouth daily. 03/03/12  Yes Nada Boozer, NP  hydrALAZINE (APRESOLINE) 50 MG tablet Take 1 tablet (50 mg total) by mouth every 8 (eight) hours. 03/03/12  Yes Nada Boozer, NP  hydrocodone-acetaminophen (LORCET-HD) 5-500 MG per capsule Take 1 capsule by mouth daily as needed. For pain   Yes Historical Provider, MD  insulin glargine (LANTUS) 100 UNIT/ML injection Inject 25 Units into the skin daily after breakfast.    Yes Historical Provider, MD  insulin lispro (HUMALOG) 100 UNIT/ML injection Inject 2-14 Units into the skin 3 (three) times daily between meals as needed. sliding scale-150-175=2 units, 176-200=4 units, 201-225=6 units, 226-250=8 units, 251-300=10 units, 301-350=12 units & >350=14 units & contact doctor   Yes Historical Provider, MD  isosorbide mononitrate (IMDUR) 60 MG 24 hr tablet Take 1 tablet (60 mg total) by mouth daily. 03/03/12  Yes Nada Boozer, NP  metolazone (ZAROXOLYN) 5 MG tablet Take 5 mg by mouth daily as needed. Only takes for a weight gain of 5lbs   Yes Historical Provider, MD  nebivolol (BYSTOLIC) 5 MG tablet Take 1 tablet (5 mg total) by mouth daily. 03/03/12  Yes Nada Boozer, NP  nitroGLYCERIN (NITROSTAT) 0.4 MG SL tablet Place 0.4 mg under the tongue every 5 (five) minutes as needed. For chest pain    Yes Historical Provider, MD  omeprazole (PRILOSEC) 20 MG capsule Take 20 mg by mouth every morning.    Yes Historical Provider, MD  potassium chloride SA (K-DUR,KLOR-CON) 20 MEQ tablet Take 1 tablet (20 mEq total) by mouth 2  (two) times daily. 03/03/12  Yes Nada Boozer, NP   Physical Exam: Filed Vitals:   05/20/12 1915 05/20/12 1929 05/20/12 1930 05/20/12 2000  BP: 178/84  162/82 180/65  Pulse: 66  62 65  Temp:      TempSrc:      Resp: 18  18 21   SpO2: 97% 100% 98% 96%    Physical Exam: General: Vital signs reviewed and noted. Well-developed, well-nourished, in no acute distress; alert, appropriate and cooperative throughout examination.  Head: Normocephalic, atraumatic.  Eyes: PERRL, EOMI, No signs of  anemia or jaundince.  Nose: Mucous membranes moist, not inflammed, nonerythematous.  Throat: Oropharynx nonerythematous, no exudate appreciated.   Neck: No deformities, masses, or tenderness noted.Supple, No carotid Bruits, no JVD.  Lungs:  Normal respiratory effort. Clear to auscultation BL without crackles or wheezes.  Heart: RRR. S1 and S2 normal without gallop, murmur, or rubs.  Abdomen:  BS normoactive. Soft, Nondistended, non-tender.  No masses or organomegaly.  Extremities: No pretibial edema.  Neurologic: A&O X3, CN II - XII are grossly intact. Motor strength is 5/5 in the all 4 extremities, Sensations intact to light touch, Cerebellar signs negative.  Skin: No visible rashes, scars.    Wt Readings from Last 3 Encounters:  03/03/12 187 lb 3.2 oz (84.913 kg)  03/03/12 187 lb 3.2 oz (84.913 kg)  03/03/12 187 lb 3.2 oz (84.913 kg)    Labs on Admission:  Basic Metabolic Panel:  Recent Labs Lab 05/20/12 1502  NA 143  K 3.6  CL 106  CO2 25  GLUCOSE 233*  BUN 49*  CREATININE 1.96*  CALCIUM 9.7    Liver Function Tests:  Recent Labs Lab 05/20/12 1502  AST 11  ALT 9  ALKPHOS 142*  BILITOT 0.3  PROT 7.4  ALBUMIN 3.5    CBC:  Recent Labs Lab 05/20/12 1502  WBC 6.5  NEUTROABS 4.2  HGB 11.9*  HCT 35.5*  MCV 87.9  PLT 174     BNP (last 3 results)  Recent Labs  02/27/12 1522 03/02/12 0555  PROBNP 1334.0* 912.4*    CBG:  Recent Labs Lab 05/20/12 1528   GLUCAP 253*     Radiological Exams on Admission: Dg Chest 2 View  05/20/2012  *RADIOLOGY REPORT*  Clinical Data: Cough.  Headache.  Body gait.  Hypertension and diabetes.  CHEST - 2 VIEW  Comparison: 04/15/2012 and multiple previous  Findings: The heart is at the upper limits of normal in size.  The aorta is unfolded.  There is been previous chest surgery on the left.  There is chronic elevation of the left hemidiaphragm.  There may be minimal patchy perihilar infiltrate/atelectasis.  No dense consolidation, collapse or effusion.  IMPRESSION: I think there are patchy densities in the perihilar regions bilaterally suggesting mild pneumonia.  No advanced disease.   Original Report Authenticated By: Paulina Fusi, M.D.     EKG: Independently reviewed. 70 beats per minute, sinus rhythm, normal axis, nonspecific ST and T wave changes noted throughout inferior leads and anterior precordial leads, similar changes noted in previous EKG done on November 20   Principal Problem:   Healthcare-associated pneumonia Active Problems:   CAD, multiple caths. Last cath 02/2012- no significant LAD ISR, no obstructing CAD   Hypertension associated with diabetes   Diabetes mellitus   Chronic renal disease, stage III   PAF, Amiodarone stopped by her cardiologist in [facility] Oct 2013   Dyslipidemia   Chronic diastolic CHF (congestive heart failure)   Assessment/Plan Patient is currently suffering from pneumonia. Given that patient had healthcare exposure within the last 90 days this will be best treated as healthcare associated pneumonia with Levaquin and cefepime to cover for MRSA and Pseudomonas. Blood cultures, sputum culture, strep and Legionella have been drawn. I will also start the patient on Tamiflu at this time and order flu PCR(as patient had high fevers with body aches and no flu shot).  1.  I will continue other medications without any changes at this time for her other medical problems.     Code  Status: Full Family Communication: None present at bedside Disposition Plan/Anticipated LOS: Home when stable  Time spent: 70 minutes  Lars Mage, MD  [facility] Team 10 If 7PM-7AM, please contact night-coverage at [email or web address], password [facility] 05/20/2012, 8:42 PM

## 2012-05-20 NOTE — Progress Notes (Signed)
Pt bp moved down to 179/74 after pt was given scheduled bp meds. RN to page MD on call and await further orders.

## 2012-05-20 NOTE — Progress Notes (Signed)
PT has an extremely high bp. RN paged pharmacy twice and also called twice. Still waiting on bp med to arrive from pharmacy.

## 2012-05-20 NOTE — ED Notes (Signed)
States had a really bad cough last week and then she started to aceh states that it is usually her heart but this time it isn't

## 2012-05-20 NOTE — ED Provider Notes (Signed)
Medical screening examination/treatment/procedure(s) were performed by non-physician practitioner and as supervising physician I was immediately available for consultation/collaboration.  Nettie Wyffels R. Aayushi Solorzano, MD 05/20/12 2012 

## 2012-05-20 NOTE — ED Notes (Signed)
Stiff and sore back pain  X 4 days  Getting worse  Has had no injury woke up all stiff she states

## 2012-05-21 DIAGNOSIS — I509 Heart failure, unspecified: Secondary | ICD-10-CM

## 2012-05-21 DIAGNOSIS — I5032 Chronic diastolic (congestive) heart failure: Secondary | ICD-10-CM

## 2012-05-21 LAB — INFLUENZA PANEL BY PCR (TYPE A & B)
Influenza A By PCR: NEGATIVE
Influenza B By PCR: NEGATIVE

## 2012-05-21 LAB — BASIC METABOLIC PANEL
BUN: 45 mg/dL — ABNORMAL HIGH (ref 6–23)
CO2: 25 mEq/L (ref 19–32)
Calcium: 9.6 mg/dL (ref 8.4–10.5)
Chloride: 107 mEq/L (ref 96–112)
Creatinine, Ser: 1.89 mg/dL — ABNORMAL HIGH (ref 0.50–1.10)

## 2012-05-21 LAB — CBC
HCT: 31.1 % — ABNORMAL LOW (ref 36.0–46.0)
MCHC: 32.8 g/dL (ref 30.0–36.0)
MCV: 87.9 fL (ref 78.0–100.0)
Platelets: 157 10*3/uL (ref 150–400)
RDW: 14.2 % (ref 11.5–15.5)

## 2012-05-21 LAB — GLUCOSE, CAPILLARY: Glucose-Capillary: 153 mg/dL — ABNORMAL HIGH (ref 70–99)

## 2012-05-21 MED ORDER — HYDROCOD POLST-CHLORPHEN POLST 10-8 MG/5ML PO LQCR
5.0000 mL | Freq: Two times a day (BID) | ORAL | Status: DC | PRN
  Administered 2012-05-21: 5 mL via ORAL
  Filled 2012-05-21: qty 5

## 2012-05-21 MED ORDER — ALBUTEROL SULFATE HFA 108 (90 BASE) MCG/ACT IN AERS
2.0000 | INHALATION_SPRAY | Freq: Four times a day (QID) | RESPIRATORY_TRACT | Status: DC
  Filled 2012-05-21: qty 6.7

## 2012-05-21 MED ORDER — INFLUENZA VIRUS VACC SPLIT PF IM SUSP
0.5000 mL | INTRAMUSCULAR | Status: AC
  Administered 2012-05-21: 0.5 mL via INTRAMUSCULAR
  Filled 2012-05-21: qty 0.5

## 2012-05-21 MED ORDER — HYDRALAZINE HCL 20 MG/ML IJ SOLN
5.0000 mg | INTRAMUSCULAR | Status: DC | PRN
  Administered 2012-05-21: 5 mg via INTRAVENOUS
  Filled 2012-05-21 (×3): qty 0.25

## 2012-05-21 MED ORDER — ALBUTEROL SULFATE HFA 108 (90 BASE) MCG/ACT IN AERS
2.0000 | INHALATION_SPRAY | Freq: Four times a day (QID) | RESPIRATORY_TRACT | Status: DC
  Administered 2012-05-21 (×3): 2 via RESPIRATORY_TRACT
  Filled 2012-05-21: qty 6.7

## 2012-05-21 MED ORDER — ALBUTEROL SULFATE (5 MG/ML) 0.5% IN NEBU
2.5000 mg | INHALATION_SOLUTION | Freq: Four times a day (QID) | RESPIRATORY_TRACT | Status: DC | PRN
  Administered 2012-05-21: 2.5 mg via RESPIRATORY_TRACT

## 2012-05-21 MED ORDER — GUAIFENESIN ER 600 MG PO TB12
1200.0000 mg | ORAL_TABLET | Freq: Two times a day (BID) | ORAL | Status: DC
  Administered 2012-05-21 – 2012-05-23 (×5): 1200 mg via ORAL
  Filled 2012-05-21 (×6): qty 2

## 2012-05-21 MED ORDER — IPRATROPIUM BROMIDE 0.02 % IN SOLN
0.5000 mg | Freq: Four times a day (QID) | RESPIRATORY_TRACT | Status: DC
Start: 2012-05-21 — End: 2012-05-21
  Administered 2012-05-21: 0.5 mg via RESPIRATORY_TRACT

## 2012-05-21 MED ORDER — ACETAMINOPHEN 325 MG PO TABS
650.0000 mg | ORAL_TABLET | Freq: Four times a day (QID) | ORAL | Status: DC | PRN
  Filled 2012-05-21 (×2): qty 2

## 2012-05-21 MED ORDER — INSULIN GLARGINE 100 UNIT/ML ~~LOC~~ SOLN
15.0000 [IU] | Freq: Every day | SUBCUTANEOUS | Status: DC
  Administered 2012-05-21 – 2012-05-22 (×2): 15 [IU] via SUBCUTANEOUS

## 2012-05-21 MED ORDER — ZOLPIDEM TARTRATE 5 MG PO TABS: 5.0000 mg | ORAL_TABLET | Freq: Every evening | ORAL | Status: DC | PRN

## 2012-05-21 MED ORDER — PNEUMOCOCCAL VAC POLYVALENT 25 MCG/0.5ML IJ INJ
0.5000 mL | INJECTION | INTRAMUSCULAR | Status: AC
  Administered 2012-05-21: 0.5 mL via INTRAMUSCULAR
  Filled 2012-05-21: qty 0.5

## 2012-05-21 NOTE — Care Management Note (Unsigned)
    Page 1 of 1   05/21/2012     6:30:03 PM   CARE MANAGEMENT NOTE 05/21/2012  Patient:  Wanda [name], Wanda [name]   Account Number:  [id number]  Date Initiated:  05/21/2012  Documentation initiated by:  Letha Cape  Subjective/Objective Assessment:   dx pna  admit- lives alone, patient has ? HHRN and aide.     Action/Plan:   Anticipated DC Date:  05/23/2012   Anticipated DC Plan:  HOME W HOME HEALTH SERVICES      DC Planning Services  CM consult      Choice offered to / List presented to:             Status of service:  In process, will continue to follow Medicare Important Message given?   (If response is "NO", the following Medicare IM given date fields will be blank) Date Medicare IM given:   Date Additional Medicare IM given:    Discharge Disposition:    Per UR Regulation:  Reviewed for med. necessity/level of care/duration of stay  If discussed at Long Length of Stay Meetings, dates discussed:    Comments:

## 2012-05-21 NOTE — Progress Notes (Signed)
[facility] PROGRESS NOTE  Wanda [name] UYQ:[id number] DOB: 1946/02/21 DOA: 05/20/2012 PCP: Lyn Hollingshead, MD  Patient is 67 year old female with past medical history  most significant for diabetes, coronary artery disease, hypertension and palpitations who comes in to the hospital with chief complaints of neck pain, and progressive shortness of breath. Patient said that she has been coughing for last 1 week. Cough has been described as dry. She also complains of intermittent fever as high as 104. Cough is associated with shortness of breath and decreased appetite. Patient also complains of neck pain and whole body ache. Patient also complains of earache bilaterally.  Patient has not had flu shot this season.  2/11 this morning she complains of continued dry, nonproductive cough that will not allow her to sleep, and is so severe it's causing headache and neck ache.   Assessment/Plan: Bilateral perihilar, healthcare associated pneumonia. Started on Levaquin and cefepime at admission Strep pneumo urinary antigen is negative, influenza panel is negative Fortunately she has remained afebrile since admission.  Chronic kidney disease Creatinine at baseline 1/5 - 2.0  Hypertension Currently controlled on Bystolic, Imdur, Hydralazine with PRN additional hydralazine.  Diabetes mellitus CBG 150 - 200 On SSI-S  Will add back 15 units of Lantus QHS  Chronic congestive heart failure, paroxysmal atrial fibrillation, coronary artery dis Quiet and Stable.  Appears euvolemic Lasix and Plavix continued.  Code Status:  Full Family Communication:  Disposition Plan: to home when ready.  24 - 48 hours.   Objective: Filed Vitals:   05/20/12 2330 05/21/12 0325 05/21/12 0404 05/21/12 0555  BP: 179/74 171/75 155/45 145/57  Pulse: 62 65 55   Temp: 97.6 F (36.4 C)  98.8 F (37.1 C)   TempSrc: Oral  Oral   Resp:   18   Height:      Weight:      SpO2:   100%     Intake/Output Summary  (Last 24 hours) at 05/21/12 1055 Last data filed at 05/21/12 0700  Gross per 24 hour  Intake    240 ml  Output   1500 ml  Net  -1260 ml   Filed Weights   05/20/12 2120  Weight: 90.084 kg (198 lb 9.6 oz)    Exam:   General:  A&O, NAD, Sitting up in bed.  Cardiovascular: rrr no m/r/g  Respiratory: cta but with decreased breath sounds as though air is not moving well,  Nonproductive cough  Abdomen: soft, nt, nd, +bs, no masses  Skin:  No rash, bruise or lesions.  Data Reviewed: Basic Metabolic Panel:  Recent Labs Lab 05/20/12 1502 05/21/12 0528  NA 143 142  K 3.6 3.8  CL 106 107  CO2 25 25  GLUCOSE 233* 229*  BUN 49* 45*  CREATININE 1.96* 1.89*  CALCIUM 9.7 9.6   Liver Function Tests:  Recent Labs Lab 05/20/12 1502  AST 11  ALT 9  ALKPHOS 142*  BILITOT 0.3  PROT 7.4  ALBUMIN 3.5   CBC:  Recent Labs Lab 05/20/12 1502 05/21/12 0528  WBC 6.5 6.0  NEUTROABS 4.2  --   HGB 11.9* 10.2*  HCT 35.5* 31.1*  MCV 87.9 87.9  PLT 174 157   BNP (last 3 results)  Recent Labs  02/27/12 1522 03/02/12 0555  PROBNP 1334.0* 912.4*   CBG:  Recent Labs Lab 05/20/12 1528 05/21/12 0047 05/21/12 0738  GLUCAP 253* 217* 153*    Studies: Dg Chest 2 View  05/20/2012  *RADIOLOGY REPORT*  Clinical Data: Cough.  Headache.  Body gait.  Hypertension and diabetes.  CHEST - 2 VIEW  Comparison: 04/15/2012 and multiple previous  Findings: The heart is at the upper limits of normal in size.  The aorta is unfolded.  There is been previous chest surgery on the left.  There is chronic elevation of the left hemidiaphragm.  There may be minimal patchy perihilar infiltrate/atelectasis.  No dense consolidation, collapse or effusion.  IMPRESSION: I think there are patchy densities in the perihilar regions bilaterally suggesting mild pneumonia.  No advanced disease.   Original Report Authenticated By: Paulina Fusi, M.D.     Scheduled Meds: . albuterol  2 puff Inhalation Q6H  .  aspirin EC  81 mg Oral Daily  . atorvastatin  10 mg Oral q1800  . bimatoprost  1 drop Both Eyes QHS  . brimonidine  1 drop Both Eyes BID  . ceFEPime (MAXIPIME) IV  1 g Intravenous Q12H  . clopidogrel  75 mg Oral BH-q7a  . furosemide  40 mg Oral Daily  . guaiFENesin  1,200 mg Oral BID  . heparin  5,000 Units Subcutaneous Q8H  . hydrALAZINE  50 mg Oral Q8H  . influenza  inactive virus vaccine  0.5 mL Intramuscular Tomorrow-1000  . insulin aspart  0-9 Units Subcutaneous TID WC  . ipratropium  0.5 mg Nebulization QID  . isosorbide mononitrate  60 mg Oral Daily  . mometasone-formoterol  2 puff Inhalation BID  . nebivolol  5 mg Oral Daily  . oseltamivir  75 mg Oral BID  . pantoprazole  40 mg Oral Daily  . pneumococcal 23 valent vaccine  0.5 mL Intramuscular Tomorrow-1000  . polyvinyl alcohol  1 drop Both Eyes TID  . potassium chloride SA  20 mEq Oral BID  . sodium chloride  3 mL Intravenous Q12H   Continuous Infusions:   Principal Problem:   Healthcare-associated pneumonia Active Problems:   CAD, multiple caths. Last cath 02/2012- no significant LAD ISR, no obstructing CAD   Hypertension associated with diabetes   Diabetes mellitus   Chronic renal disease, stage III   PAF, Amiodarone stopped by her cardiologist in [facility] Oct 2013   Dyslipidemia   Chronic diastolic CHF (congestive heart failure)    Time spent: 35 min    Conley Canal  [facility] Pager [phone]. If 8PM-8AM, please contact night-coverage at [email or web address], password [facility] 05/21/2012, 10:55 AM  LOS: 1 day

## 2012-05-21 NOTE — Progress Notes (Signed)
Pt bp elevated at 171/75. Pt given 5mg  of hydralazine IV. RN will have tech to recheck bp in 30 mins.

## 2012-05-21 NOTE — Progress Notes (Signed)
-   Agree with above. - continue IV antibiotics. - feels better.

## 2012-05-21 NOTE — Progress Notes (Signed)
Pt bp moved down to 155/45. RN paged MD on call awaiting further orders.

## 2012-05-21 NOTE — Progress Notes (Signed)
D/c Droplet precaution Flu PCR negative

## 2012-05-22 ENCOUNTER — Inpatient Hospital Stay (HOSPITAL_COMMUNITY): Payer: PRIVATE HEALTH INSURANCE

## 2012-05-22 DIAGNOSIS — E119 Type 2 diabetes mellitus without complications: Secondary | ICD-10-CM

## 2012-05-22 DIAGNOSIS — I2510 Atherosclerotic heart disease of native coronary artery without angina pectoris: Secondary | ICD-10-CM

## 2012-05-22 LAB — GLUCOSE, CAPILLARY: Glucose-Capillary: 189 mg/dL — ABNORMAL HIGH (ref 70–99)

## 2012-05-22 LAB — BASIC METABOLIC PANEL
BUN: 46 mg/dL — ABNORMAL HIGH (ref 6–23)
CO2: 24 mEq/L (ref 19–32)
Chloride: 110 mEq/L (ref 96–112)
Glucose, Bld: 182 mg/dL — ABNORMAL HIGH (ref 70–99)
Potassium: 4 mEq/L (ref 3.5–5.1)
Sodium: 145 mEq/L (ref 135–145)

## 2012-05-22 MED ORDER — CYCLOBENZAPRINE HCL 10 MG PO TABS: 5.0000 mg | ORAL_TABLET | Freq: Three times a day (TID) | ORAL | Status: DC | PRN

## 2012-05-22 MED ORDER — IOHEXOL 300 MG/ML  SOLN
75.0000 mL | Freq: Once | INTRAMUSCULAR | Status: AC | PRN
  Administered 2012-05-22: 75 mL via INTRAVENOUS

## 2012-05-22 MED ORDER — LEVOFLOXACIN 750 MG PO TABS
750.0000 mg | ORAL_TABLET | ORAL | Status: DC
  Administered 2012-05-22: 750 mg via ORAL
  Filled 2012-05-22: qty 1

## 2012-05-22 MED ORDER — METHYLPREDNISOLONE SODIUM SUCC 125 MG IJ SOLR
80.0000 mg | Freq: Once | INTRAMUSCULAR | Status: AC
  Administered 2012-05-22: 80 mg via INTRAVENOUS
  Filled 2012-05-22: qty 1.28

## 2012-05-22 MED ORDER — SODIUM CHLORIDE 0.9 % IV SOLN
250.0000 mL | INTRAVENOUS | Status: DC | PRN
  Administered 2012-05-22: 250 mL via INTRAVENOUS
  Administered 2012-05-23: 1000 mL via INTRAVENOUS

## 2012-05-22 MED ORDER — ACETAMINOPHEN 500 MG PO TABS
1000.0000 mg | ORAL_TABLET | Freq: Three times a day (TID) | ORAL | Status: DC
  Administered 2012-05-22 – 2012-05-23 (×3): 1000 mg via ORAL
  Filled 2012-05-22 (×5): qty 2

## 2012-05-22 MED ORDER — LORAZEPAM 2 MG/ML IJ SOLN
0.5000 mg | Freq: Once | INTRAMUSCULAR | Status: AC | PRN
  Administered 2012-05-22: 0.5 mg via INTRAVENOUS
  Filled 2012-05-22: qty 1

## 2012-05-22 MED ORDER — LEVOFLOXACIN IN D5W 750 MG/150ML IV SOLN
750.0000 mg | INTRAVENOUS | Status: DC
Start: 2012-05-22 — End: 2012-05-22
  Filled 2012-05-22: qty 150

## 2012-05-22 NOTE — Progress Notes (Addendum)
NCM spoke to pt and offered choice for [facility]. She requested [facility] for [facility]. Contacted RW for home. Isidoro Donning RN CCM Case Mgmt phone [phone]

## 2012-05-22 NOTE — Progress Notes (Signed)
Hydralazine still hasnt arrived for pt. RN notified & passed on to dayshift nurse to give meds

## 2012-05-22 NOTE — Progress Notes (Signed)
ANTIBIOTIC CONSULT NOTE - INITIAL  Pharmacy Consult for Levaquin Indication: pneumonia  Allergies  Allergen Reactions  . Blue Dyes (Parenteral) Swelling       . Erythromycin Palpitations    Patient Measurements: Height: 5\' 1"  (154.9 cm) Weight: 198 lb 9.6 oz (90.084 kg) IBW/kg (Calculated) : 47.8  Vital Signs: Temp: 97.7 F (36.5 C) (02/12 0515) Temp src: Oral (02/12 0515) BP: 165/85 mmHg (02/12 1421) Pulse Rate: 64 (02/12 0515) Intake/Output from previous day: 02/11 0701 - 02/12 0700 In: 763 [P.O.:760; I.V.:3] Out: 1700 [Urine:1700] Intake/Output from this shift: Total I/O In: 360 [P.O.:360] Out: 400 [Urine:400]  Labs:  Recent Labs  05/20/12 1502 05/21/12 0528 05/22/12 0649  WBC 6.5 6.0  --   HGB 11.9* 10.2*  --   PLT 174 157  --   CREATININE 1.96* 1.89* 2.37*   Estimated Creatinine Clearance: 23.8 ml/min (by C-G formula based on Cr of 2.37).  Microbiology:  Medical History: Past Medical History  Diagnosis Date  . Palpitations   . History of angina   . HTN (hypertension)   . DM (diabetes mellitus)   . CAD (coronary artery disease)   . Bigeminy   . Obesity   . Renal failure   . Spinal headache   . PONV (postoperative nausea and vomiting)   . Adverse effect of anesthesia 05/2009    MI during cardiac  stent placement  . Angina   . Myocardial infarction 05/2009  . Heart murmur   . Asthma   . Shortness of breath     "w/exertion"  . Hypothyroidism     "started med 01/2011"  . Blood transfusion   . Anemia 02/14/11     "I have taken shots for it; I take iron pills now"  . CHF (congestive heart failure)   . Recurrent upper respiratory infection (URI)     "colds every year"  . Recurrent upper respiratory infection (URI)   . Pneumonia     last time "winter 2011"  . Hiatal hernia   . Depression      "lost son 03/2010"  . GERD (gastroesophageal reflux disease)   . Arthritis   . 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 Atrial  fibrillation    Assessment:  Maxipime 1 gram IV q12hrs begun on admission 2/10.  Levaquin 750 mg IV x 1 given 2/10 ~6pm.  Maxipime now stopping, Levaquin PO to continue for HCAP coverage.  Dry cough, no sputum culture available. Afebrile, WBC 6.0.  Received Tamiflu 2/10 -2/11;  PCR negative.  Scr trending up.   On daily Lasix.  Goal of Therapy:  appropriate dose for renal function and infection  Plan:   Levaquin 750 mg PO q48hrs - due today.  Will follow up renal function for any need to adjust, but would not need daily dosing unless crcl >50 ml/min.  Dennie Fetters, [place] Pager: [phone] 05/22/2012,2:43 PM

## 2012-05-22 NOTE — Progress Notes (Signed)
PATIENT DETAILS Name: Wanda [name] Age: 67 y.o. Sex: female Date of Birth: 06-11-1945 Admit Date: 05/20/2012 Admitting Physician Lars Mage, MD ZOX:[id number],VWUJ, MD  Subjective: Still complaining of right-sided neck pain  Assessment/Plan: Principal Problem:   Healthcare-associated pneumonia - Now afebrile, with no leukocytosis - Will start tapering down antibiotics, DC cefepime-maintain on Levaquin  Active Problems: Right-sided neck pain - CT of the soft tissues (with no contrast) does not show any significant abnormalities-she also has the same pain in  right upper chest area as well (please note-that the right chest pain and right neck pain easily reproducible with palpation and movement) - Suspect this to be more of muscle spasm/musculoskeletal etiology-trial of one dose of Solu-Medrol, scheduled Tylenol and as needed muscle relaxant- reassess tomorrow morning - Do not think-this is anything neurologic-she has adequate 5/5 strength in all 4 extremities. She has no gross sensory deficits  Hypertension -Currently moderately controlled on Bystolic, Imdur, Hydralazine , Lasix with PRN additional hydralazine. His blood pressure continues to stay on the higher side-will increase hydralazine to 100 mg 3 times a day  Diabetes - CBGs controlled - Continue with SSI and Lantus    CAD, multiple caths. Last cath 02/2012- no significant LAD ISR, no obstructing CAD - Continue with aspirin, Plavix, beta blockers and statin - This is currently stable    Chronic renal disease, stage III - Slight bump in the patient's creatinine today-but close to usual range    Dyslipidemia - Continue with statin    Chronic diastolic CHF (congestive heart failure) - Clinically compensated - On beta blockers and Lasix  History of? PAF/torsades - Loop recorder was implanted in November - At patient's request-primary cardiology group-[facility]- has been notified - Will place on telemetry   Disposition:   Remain inpatient- home next one or two days  DVT Prophylaxis: Prophylactic Heparin  Code Status: Full code   Procedures:  None  CONSULTS:  None  PHYSICAL EXAM: Vital signs in last 24 hours: Filed Vitals:   05/21/12 2116 05/21/12 2123 05/21/12 2250 05/22/12 0515  BP:  189/98 145/89 156/89  Pulse:  60  64  Temp:  97.6 F (36.4 C)  97.7 F (36.5 C)  TempSrc:  Oral  Oral  Resp:  20  22  Height:      Weight:      SpO2: 96% 97%  92%    Weight change:  Body mass index is 37.54 kg/(m^2).   Gen Exam: Awake and alert with clear speech.   Neck: Supple, No JVD.  Tender in the right neck area on palpation and on movement of the right side. Also tender to touch/palpation in the right upper chest area. Chest: B/L Clear.   CVS: S1 S2 Regular, no murmurs.  Abdomen: soft, BS +, non tender, non distended.  Extremities: no edema, lower extremities warm to touch. Neurologic: Non Focal.   Skin: No Rash.   Wounds: N/A.    Intake/Output from previous day:  Intake/Output Summary (Last 24 hours) at 05/22/12 1407 Last data filed at 05/22/12 0858  Gross per 24 hour  Intake    880 ml  Output   2100 ml  Net  -1220 ml     LAB RESULTS: CBC  Recent Labs Lab 05/20/12 1502 05/21/12 0528  WBC 6.5 6.0  HGB 11.9* 10.2*  HCT 35.5* 31.1*  PLT 174 157  MCV 87.9 87.9  MCH 29.5 28.8  MCHC 33.5 32.8  RDW 14.4 14.2  LYMPHSABS 1.6  --  MONOABS 0.6  --   EOSABS 0.2  --   BASOSABS 0.0  --     Chemistries   Recent Labs Lab 05/20/12 1502 05/21/12 0528 05/22/12 0649  NA 143 142 145  K 3.6 3.8 4.0  CL 106 107 110  CO2 25 25 24   GLUCOSE 233* 229* 182*  BUN 49* 45* 46*  CREATININE 1.96* 1.89* 2.37*  CALCIUM 9.7 9.6 9.7    CBG:  Recent Labs Lab 05/21/12 1135 05/21/12 1645 05/21/12 2144 05/22/12 0750 05/22/12 1158  GLUCAP 206* 181* 144* 161* 189*    GFR Estimated Creatinine Clearance: 23.8 ml/min (by C-G formula based on Cr of 2.37).  Coagulation profile No  results found for this basename: INR, PROTIME,  in the last 168 hours  Cardiac Enzymes No results found for this basename: CK, CKMB, TROPONINI, MYOGLOBIN,  in the last 168 hours  No components found with this basename: POCBNP,  No results found for this basename: DDIMER,  in the last 72 hours No results found for this basename: HGBA1C,  in the last 72 hours No results found for this basename: CHOL, HDL, LDLCALC, TRIG, CHOLHDL, LDLDIRECT,  in the last 72 hours No results found for this basename: TSH, T4TOTAL, FREET3, T3FREE, THYROIDAB,  in the last 72 hours No results found for this basename: VITAMINB12, FOLATE, FERRITIN, TIBC, IRON, RETICCTPCT,  in the last 72 hours No results found for this basename: LIPASE, AMYLASE,  in the last 72 hours  Urine Studies No results found for this basename: UACOL, UAPR, USPG, UPH, UTP, UGL, UKET, UBIL, UHGB, UNIT, UROB, ULEU, UEPI, UWBC, URBC, UBAC, CAST, CRYS, UCOM, BILUA,  in the last 72 hours  MICROBIOLOGY: No results found for this or any previous visit (from the past 240 hour(s)).  RADIOLOGY STUDIES/RESULTS: Dg Chest 2 View  05/20/2012  *RADIOLOGY REPORT*  Clinical Data: Cough.  Headache.  Body gait.  Hypertension and diabetes.  CHEST - 2 VIEW  Comparison: 04/15/2012 and multiple previous  Findings: The heart is at the upper limits of normal in size.  The aorta is unfolded.  There is been previous chest surgery on the left.  There is chronic elevation of the left hemidiaphragm.  There may be minimal patchy perihilar infiltrate/atelectasis.  No dense consolidation, collapse or effusion.  IMPRESSION: I think there are patchy densities in the perihilar regions bilaterally suggesting mild pneumonia.  No advanced disease.   Original Report Authenticated By: Paulina Fusi, M.D.    Ct Soft Tissue Neck W Contrast  05/22/2012  *RADIOLOGY REPORT*  Clinical Data: Right-sided pain and swelling with progressive shortness of breath.  CT NECK WITH CONTRAST  Technique:   Multidetector CT imaging of the neck was performed with intravenous contrast.  Contrast: 75mL OMNIPAQUE IOHEXOL 300 MG/ML  SOLN  Comparison: None.  Findings: Limited visualization of the intracranial contents does not show any abnormality.  Lung apices show emphysema and scarring of postoperative changes on the left.  The lungs are not primarily or completely evaluated.  Both parotid glands are normal.  Both submandibular glands are normal.  No thyroid lesion identified.  Arterial and venous structures of the neck do not show any acute finding.  The patient has had some atherosclerotic change of both carotid bifurcations.  There are no pathologically enlarged nodes on either side of the neck.  No evidence of cyst or mass.  Ordinary degenerative changes effect the spine.  No mucosal or submucosal lesion is seen.  IMPRESSION: No cause of the patient's described  symptoms is identified.   Original Report Authenticated By: Paulina Fusi, M.D.     MEDICATIONS: Scheduled Meds: . albuterol  2 puff Inhalation QID  . aspirin EC  81 mg Oral Daily  . atorvastatin  10 mg Oral q1800  . bimatoprost  1 drop Both Eyes QHS  . brimonidine  1 drop Both Eyes BID  . ceFEPime (MAXIPIME) IV  1 g Intravenous Q12H  . clopidogrel  75 mg Oral BH-q7a  . furosemide  40 mg Oral Daily  . guaiFENesin  1,200 mg Oral BID  . heparin  5,000 Units Subcutaneous Q8H  . hydrALAZINE  50 mg Oral Q8H  . insulin aspart  0-9 Units Subcutaneous TID WC  . insulin glargine  15 Units Subcutaneous QHS  . isosorbide mononitrate  60 mg Oral Daily  . mometasone-formoterol  2 puff Inhalation BID  . nebivolol  5 mg Oral Daily  . pantoprazole  40 mg Oral Daily  . polyvinyl alcohol  1 drop Both Eyes TID  . potassium chloride SA  20 mEq Oral BID  . sodium chloride  3 mL Intravenous Q12H   Continuous Infusions:  PRN Meds:.sodium chloride, acetaminophen, albuterol, chlorpheniramine-HYDROcodone, hydrALAZINE, HYDROcodone-acetaminophen, nitroGLYCERIN,  ondansetron (ZOFRAN) IV, ondansetron, sodium chloride, zolpidem  Antibiotics: Anti-infectives   Start     Dose/Rate Route Frequency Ordered Stop   05/20/12 2200  ceFEPIme (MAXIPIME) 1 g in dextrose 5 % 50 mL IVPB     1 g 100 mL/hr over 30 Minutes Intravenous Every 12 hours 05/20/12 1917     05/20/12 2115  oseltamivir (TAMIFLU) capsule 75 mg  Status:  Discontinued     75 mg Oral 2 times daily 05/20/12 2112 05/21/12 1310   05/20/12 1815  levofloxacin (LEVAQUIN) IVPB 750 mg     750 mg 100 mL/hr over 90 Minutes Intravenous  Once 05/20/12 1812 05/20/12 2013       Alyanah Elliott, MD  [facility] Pager:[phone]  If 7PM-7AM, please contact night-coverage [email or web address] Password [email or web address] 05/22/2012, 2:07 PM   LOS: 2 days

## 2012-05-22 NOTE — Progress Notes (Signed)
Well known 67 yo patient of ours with a history of chest pain and multiple caths in the past, last in 02/2012, no in-stent restenosis. She presents with right neck pain, possible pneumonia. No chest pain or heart failure symptoms.   No active cardiac issues. Will be available as needed. Thanks for notifying us of her admission.  Chrystie Nose, MD, [facility] Attending Cardiologist [facility]

## 2012-05-22 NOTE — Progress Notes (Signed)
Went to give pt her pain meds that she requested but pt was sound asleep.

## 2012-05-23 LAB — BASIC METABOLIC PANEL
Chloride: 107 mEq/L (ref 96–112)
GFR calc Af Amer: 27 mL/min — ABNORMAL LOW (ref >90)
GFR calc non Af Amer: 23 mL/min — ABNORMAL LOW (ref >90)
Potassium: 4 mEq/L (ref 3.5–5.1)
Sodium: 142 mEq/L (ref 135–145)

## 2012-05-23 LAB — RENAL FUNCTION PANEL
BUN: 46 mg/dL — ABNORMAL HIGH (ref 6–23)
CO2: 20 mEq/L (ref 19–32)
Calcium: 9.9 mg/dL (ref 8.4–10.5)
Chloride: 107 mEq/L (ref 96–112)
Creatinine, Ser: 2.07 mg/dL — ABNORMAL HIGH (ref 0.50–1.10)

## 2012-05-23 MED ORDER — HYDROCOD POLST-CHLORPHEN POLST 10-8 MG/5ML PO LQCR: 5.0000 mL | Freq: Two times a day (BID) | ORAL | Status: DC | PRN

## 2012-05-23 MED ORDER — PREDNISONE 10 MG PO TABS: 40.0000 mg | ORAL_TABLET | Freq: Every day | ORAL | Status: DC

## 2012-05-23 MED ORDER — LEVOFLOXACIN 750 MG PO TABS: 750.0000 mg | ORAL_TABLET | ORAL | Status: DC

## 2012-05-23 NOTE — Discharge Summary (Signed)
Physician Discharge Summary  Wanda [name] AVW:[id number] DOB: 09/27/1945 DOA: 05/20/2012  PCP: Lyn Hollingshead, MD  Admit date: 05/20/2012 Discharge date: 05/23/2012  Time spent: 40 minutes  Recommendations for Outpatient Follow-up:  Home Health RN for medication management.  Home health aide as needed Check bmet and hemoglobin A1c in 1 week. Monitor BP and Neck pain.  Discharge Diagnoses:  Principal Problem:   Healthcare-associated pneumonia Active Problems:   CAD, multiple caths. Last cath 02/2012- no significant LAD ISR, no obstructing CAD   Hypertension associated with diabetes   Diabetes mellitus   Chronic renal disease, stage III   PAF, Amiodarone stopped by her cardiologist in [facility] Oct 2013   Dyslipidemia   Chronic diastolic CHF (congestive heart failure)   Discharge Condition: stable, much improved.  Diet recommendation: Heart Healthy  Filed Weights   05/20/12 2120  Weight: 90.084 kg (198 lb 9.6 oz)    History of present illness:   Patient is 67 year old female with past medical history most significant for diabetes, coronary artery disease, hypertension and palpitations who comes in to the hospital with chief complaints of neck pain, and progressive shortness of breath. Patient said that she has been coughing for last 1 week. Cough has been described as dry. She also complains of intermittent fever as high as 104. Cough is associated with shortness of breath and decreased appetite. Patient also complains of neck pain and whole body ache. Patient also complains of earache bilaterally. Patient has not had flu shot this season.  Hospital Course:  Healthcare-associated pneumonia  Chest xray showed bilateral perihilar pneumonia. She was started on broad-spectrum IV antibiotics. Strep pneumo urinary antigen was negative influenza panel was negative. She remained afebrile after admission. She did have a severe cough which was treated supportively with mucolytics and Tussionex.  Her white count is normal. Her antibiotics were narrowed to oral Levaquin. This was dosed every other day due  2 to her chronic kidney disease. She will be discharged with 2 more doses of Levaquin. We request that she follows up with her primary care physician in 7-10 days.  Active Problems:  Right-sided neck pain  At the time of admission the patient did have right-sided neck pain. This was assumed to be from coughing. However she had some worsening of pain and swelling. A CT of the soft tissues of her neck did not show any significant abnormalities. The neck pain was reproducible on palpation and with movement. Her neck pain was assumed to be more of a muscle spasm or musculoskeletal etiology. She was dosed with Solu-Medrol and Tylenol. She will be discharged with a prednisone taper, and with recommendations to use Tylenol and heating pad as appropriate.  Hypertension  Currently moderately controlled on Bystolic, Imdur, Hydralazine , Lasix with PRN additional hydralazine. Her blood pressure continued to stay on the higher side and the doses of hydralazine was increased to 100 mg 3 times a day. At the time of discharge will return her to her prior to admission dose of 50 mg 3 times a day. Request Dr. Allena Katz followup on her blood pressure.  Diabetes  CBGs were well controlled on insulin in the hospital. However they did increase when she was given Solu-Medrol. She will be discharged home on her prior dosages of insulin. We do recommend a hemoglobin A1c when she sees Dr. Allena Katz next week.  CAD, multiple caths. Last cath 02/2012- no significant LAD ISR, no obstructing CAD  Continue with aspirin, Plavix, beta blockers and statin.  The patient  was seen briefly by Dr. Rennis Golden of [facility] vascular, who felt her cardiac issues were stable and quiet during this hospitalization.  Chronic renal disease, stage III  The patient's creatinine stayed well below baseline initially. After receiving IV  contrast for a CT of her neck and Solu-Medrol her creatinine went up slightly but was still below her baseline. We recommend a bmet be checked in one week  Dyslipidemia  Continue with statin . Stable.  Chronic diastolic CHF (congestive heart failure)  Clinically compensated.   On beta blockers, Lasix , And Zaroxolyn.  History of? PAF/torsades  Loop recorder was implanted in November .  At patient's request-primary cardiology group-[facility]- has been notified . She was monitored on telemetry throughout her stay and had no issues.   Consultations: Cardiology   Discharge Exam: Filed Vitals:   05/22/12 2234 05/22/12 2333 05/23/12 0604 05/23/12 0831  BP: 196/99 190/78 109/78   Pulse: 70 71 69   Temp: 97.8 F (36.6 C)  97.8 F (36.6 C)   TempSrc: Oral  Oral   Resp: 20  18   Height:      Weight:      SpO2: 97%  97% 97%    General: Alert and oriented, no apparent distress, appears well  Neck and right shoulder: Still slightly tender to palpation no noticeable swelling  Cardiovascular:  Slightly tachycardic, no murmurs rubs or gallops, no lower extremity edema  Respiratory: Clear to auscultation, no wheezes crackles arousals, no accessory muscle use  Discharge Instructions  Discharge Orders   Future Orders Complete By Expires     Diet - low sodium heart healthy  As directed     Face-to-face encounter  As directed     Comments:      I Stephani Police certify that this patient is under my care and that I, or a nurse practitioner or physician's assistant working with me, had a face-to-face encounter that meets the physician face-to-face encounter requirements with this patient on 05/23/2012. The encounter with the patient was in whole, or in part for the following medical condition(s) which is the primary reason for home health care (List medical condition):HCAP Pneumonia, Torticollis    Questions:      The encounter with the patient was in whole, or in part, for the following medical  condition, which is the primary reason for home health care:  HCAP Pneumonia, Torticollis    I certify that, based on my findings, the following services are medically necessary home health services:  Nursing    My clinical findings support the need for the above services:  Unable to leave home safely without assistance and/or assistive device    Further, I certify that my clinical findings support that this patient is homebound due to:  Unable to leave home safely without assistance    To provide the following care/treatments:  Home Health Aide    RN    Home Health  As directed     Comments:      Home health RN to help with medication management.    Questions:      To provide the following care/treatments:  Home Health Aide    RN    Increase activity slowly  As directed         Medication List    TAKE these medications       albuterol 108 (90 BASE) MCG/ACT inhaler  Commonly known as:  PROVENTIL HFA;VENTOLIN HFA  Inhale 2 puffs into the lungs  every 6 (six) hours as needed. For shortness of breath/wheezing     aspirin EC 81 MG tablet  Take 81 mg by mouth daily.     atorvastatin 10 MG tablet  Commonly known as:  LIPITOR  Take 1 tablet (10 mg total) by mouth daily at 6 PM.     brimonidine 0.15 % ophthalmic solution  Commonly known as:  ALPHAGAN  Place 1 drop into both eyes 2 (two) times daily.     chlorpheniramine-HYDROcodone 10-8 MG/5ML Lqcr  Commonly known as:  TUSSIONEX  Take 5 mLs by mouth every 12 (twelve) hours as needed.     clopidogrel 75 MG tablet  Commonly known as:  PLAVIX  Take 75 mg by mouth every morning.     cyclobenzaprine 5 MG tablet  Commonly known as:  FLEXERIL  Take 5 mg by mouth 2 (two) times daily as needed. For muscle spasms     ferrous sulfate 325 (65 FE) MG tablet  Take 325 mg by mouth 2 (two) times daily.     Fluticasone-Salmeterol 500-50 MCG/DOSE Aepb  Commonly known as:  ADVAIR  Inhale 1 puff into the lungs every 12 (twelve) hours.      furosemide 40 MG tablet  Commonly known as:  LASIX  Take 1 tablet (40 mg total) by mouth daily.     hydrALAZINE 50 MG tablet  Commonly known as:  APRESOLINE  Take 1 tablet (50 mg total) by mouth every 8 (eight) hours.     hydrocodone-acetaminophen 5-500 MG per capsule  Commonly known as:  LORCET-HD  Take 1 capsule by mouth daily as needed. For pain     insulin glargine 100 UNIT/ML injection  Commonly known as:  LANTUS  Inject 25 Units into the skin daily after breakfast.     insulin lispro 100 UNIT/ML injection  Commonly known as:  HUMALOG  Inject 2-14 Units into the skin 3 (three) times daily between meals as needed. sliding scale-150-175=2 units, 176-200=4 units, 201-225=6 units, 226-250=8 units, 251-300=10 units, 301-350=12 units & >350=14 units & contact doctor     isosorbide mononitrate 60 MG 24 hr tablet  Commonly known as:  IMDUR  Take 1 tablet (60 mg total) by mouth daily.     levofloxacin 750 MG tablet  Commonly known as:  LEVAQUIN  Take 1 tablet (750 mg total) by mouth every other day.     LUMIGAN 0.01 % Soln  Generic drug:  bimatoprost  Place 1 drop into both eyes at bedtime.     metolazone 5 MG tablet  Commonly known as:  ZAROXOLYN  Take 5 mg by mouth daily as needed. Only takes for a weight gain of 5lbs     nebivolol 5 MG tablet  Commonly known as:  BYSTOLIC  Take 1 tablet (5 mg total) by mouth daily.     nitroGLYCERIN 0.4 MG SL tablet  Commonly known as:  NITROSTAT  Place 0.4 mg under the tongue every 5 (five) minutes as needed. For chest pain     omeprazole 20 MG capsule  Commonly known as:  PRILOSEC  Take 20 mg by mouth every morning.     potassium chloride SA 20 MEQ tablet  Commonly known as:  K-DUR,KLOR-CON  Take 1 tablet (20 mEq total) by mouth 2 (two) times daily.     predniSONE 10 MG tablet  Commonly known as:  DELTASONE  Take 4 tablets (40 mg total) by mouth daily. Take 4 tablets on 2/14  Take 3 tablets on  2/15  Take 2 tablets on  2/16  Take 1 tab on 2/17     REFRESH OPTIVE ADVANCED 0.5-1-0.5 % Soln  Generic drug:  Carboxymeth-Glycerin-Polysorb  Place 1 drop into both eyes 3 (three) times daily.           Follow-up Information   Follow up with [facility] . (Home Health RN )    Contact information:   [phone]       The results of significant diagnostics from this hospitalization (including imaging, microbiology, ancillary and laboratory) are listed below for reference.    Significant Diagnostic Studies: Dg Chest 2 View  05/20/2012  *RADIOLOGY REPORT*  Clinical Data: Cough.  Headache.  Body gait.  Hypertension and diabetes.  CHEST - 2 VIEW  Comparison: 04/15/2012 and multiple previous  Findings: The heart is at the upper limits of normal in size.  The aorta is unfolded.  There is been previous chest surgery on the left.  There is chronic elevation of the left hemidiaphragm.  There may be minimal patchy perihilar infiltrate/atelectasis.  No dense consolidation, collapse or effusion.  IMPRESSION: I think there are patchy densities in the perihilar regions bilaterally suggesting mild pneumonia.  No advanced disease.   Original Report Authenticated By: Paulina Fusi, M.D.    Ct Soft Tissue Neck W Contrast  05/22/2012  *RADIOLOGY REPORT*  Clinical Data: Right-sided pain and swelling with progressive shortness of breath.  CT NECK WITH CONTRAST  Technique:  Multidetector CT imaging of the neck was performed with intravenous contrast.  Contrast: 75mL OMNIPAQUE IOHEXOL 300 MG/ML  SOLN  Comparison: None.  Findings: Limited visualization of the intracranial contents does not show any abnormality.  Lung apices show emphysema and scarring of postoperative changes on the left.  The lungs are not primarily or completely evaluated.  Both parotid glands are normal.  Both submandibular glands are normal.  No thyroid lesion identified.  Arterial and venous structures of the neck do not show any acute finding.  The patient  has had some atherosclerotic change of both carotid bifurcations.  There are no pathologically enlarged nodes on either side of the neck.  No evidence of cyst or mass.  Ordinary degenerative changes effect the spine.  No mucosal or submucosal lesion is seen.  IMPRESSION: No cause of the patient's described symptoms is identified.   Original Report Authenticated By: Paulina Fusi, M.D.     Microbiology: No results found for this or any previous visit (from the past 240 hour(s)).   Labs: Basic Metabolic Panel:  Recent Labs Lab 05/20/12 1502 05/21/12 0528 05/22/12 0649 05/23/12 0924 05/23/12 0926  NA 143 142 145 141 142  K 3.6 3.8 4.0 4.0 4.0  CL 106 107 110 107 107  CO2 25 25 24 20 21   GLUCOSE 233* 229* 182* 249* 251*  BUN 49* 45* 46* 46* 47*  CREATININE 1.96* 1.89* 2.37* 2.07* 2.10*  CALCIUM 9.7 9.6 9.7 9.9 10.1  PHOS  --   --   --  3.6  --    Liver Function Tests:  Recent Labs Lab 05/20/12 1502 05/23/12 0924  AST 11  --   ALT 9  --   ALKPHOS 142*  --   BILITOT 0.3  --   PROT 7.4  --   ALBUMIN 3.5 3.3*   CBC:  Recent Labs Lab 05/20/12 1502 05/21/12 0528  WBC 6.5 6.0  NEUTROABS 4.2  --   HGB 11.9* 10.2*  HCT 35.5* 31.1*  MCV 87.9 87.9  PLT 174 157   BNP: BNP (last 3 results)  Recent Labs  02/27/12 1522 03/02/12 0555  PROBNP 1334.0* 912.4*   CBG:  Recent Labs Lab 05/22/12 0750 05/22/12 1158 05/22/12 1747 05/22/12 2230 05/23/12 0731  GLUCAP 161* 189* 150* 282* 285*       Signed:  Conley Canal [phone]  [facility] 05/23/2012, 11:28 AM  Attending - Seen and examined, continues to improve. Neck pain significantly better after a trial of steroids-cannot use nonsteroidal anti-inflammatory medications given chronic kidney disease, therefore will continue with the steroid taper on discharge. She is afebrile. We will continue with antibiotics for a few more days. She is stable to be discharged home today. Appreciate note from  cardiology.  S Ghimire

## 2012-05-23 NOTE — Progress Notes (Signed)
Pt lost one of the caps to her eyes drops. RN looked all around for it but could not find it. Left open eye drop on "med cart" in pt's room so that med does not spill in pt's draw.

## 2012-05-23 NOTE — Progress Notes (Signed)
Pt discharged to home via ambulance. Home health ordered. IV and tele discontinued. No pain, no complaints. No skin breakdown or wounds. Pt alert to self, place and situation but intermittently confused. Discharge instructions and prescriptions to be sent with patient. Will continue to monitor until ambulance arrives. [place], [employer]

## 2012-06-02 ENCOUNTER — Encounter (HOSPITAL_COMMUNITY): Payer: Self-pay | Admitting: *Deleted

## 2012-06-02 ENCOUNTER — Emergency Department (HOSPITAL_COMMUNITY): Payer: Medicare Other

## 2012-06-02 ENCOUNTER — Emergency Department (HOSPITAL_COMMUNITY)
Admission: EM | Admit: 2012-06-02 | Discharge: 2012-06-02 | Disposition: A | Discharge status: Home / Self Care | Payer: Medicare Other | Attending: Emergency Medicine | Admitting: Emergency Medicine

## 2012-06-02 DIAGNOSIS — E119 Type 2 diabetes mellitus without complications: Secondary | ICD-10-CM | POA: Insufficient documentation

## 2012-06-02 DIAGNOSIS — M129 Arthropathy, unspecified: Secondary | ICD-10-CM | POA: Insufficient documentation

## 2012-06-02 DIAGNOSIS — J45909 Unspecified asthma, uncomplicated: Secondary | ICD-10-CM | POA: Insufficient documentation

## 2012-06-02 DIAGNOSIS — I252 Old myocardial infarction: Secondary | ICD-10-CM | POA: Insufficient documentation

## 2012-06-02 DIAGNOSIS — J40 Bronchitis, not specified as acute or chronic: Secondary | ICD-10-CM

## 2012-06-02 DIAGNOSIS — Z7982 Long term (current) use of aspirin: Secondary | ICD-10-CM | POA: Insufficient documentation

## 2012-06-02 DIAGNOSIS — K219 Gastro-esophageal reflux disease without esophagitis: Secondary | ICD-10-CM | POA: Insufficient documentation

## 2012-06-02 DIAGNOSIS — Z7902 Long term (current) use of antithrombotics/antiplatelets: Secondary | ICD-10-CM | POA: Insufficient documentation

## 2012-06-02 DIAGNOSIS — Z8701 Personal history of pneumonia (recurrent): Secondary | ICD-10-CM | POA: Insufficient documentation

## 2012-06-02 DIAGNOSIS — D649 Anemia, unspecified: Secondary | ICD-10-CM | POA: Insufficient documentation

## 2012-06-02 DIAGNOSIS — I10 Essential (primary) hypertension: Secondary | ICD-10-CM | POA: Insufficient documentation

## 2012-06-02 DIAGNOSIS — Z8719 Personal history of other diseases of the digestive system: Secondary | ICD-10-CM | POA: Insufficient documentation

## 2012-06-02 DIAGNOSIS — Z87448 Personal history of other diseases of urinary system: Secondary | ICD-10-CM | POA: Insufficient documentation

## 2012-06-02 DIAGNOSIS — Z79899 Other long term (current) drug therapy: Secondary | ICD-10-CM | POA: Insufficient documentation

## 2012-06-02 DIAGNOSIS — E669 Obesity, unspecified: Secondary | ICD-10-CM | POA: Insufficient documentation

## 2012-06-02 DIAGNOSIS — E039 Hypothyroidism, unspecified: Secondary | ICD-10-CM | POA: Insufficient documentation

## 2012-06-02 DIAGNOSIS — Z8709 Personal history of other diseases of the respiratory system: Secondary | ICD-10-CM | POA: Insufficient documentation

## 2012-06-02 DIAGNOSIS — I4891 Unspecified atrial fibrillation: Secondary | ICD-10-CM | POA: Insufficient documentation

## 2012-06-02 DIAGNOSIS — R0789 Other chest pain: Secondary | ICD-10-CM | POA: Insufficient documentation

## 2012-06-02 DIAGNOSIS — F3289 Other specified depressive episodes: Secondary | ICD-10-CM | POA: Insufficient documentation

## 2012-06-02 DIAGNOSIS — I509 Heart failure, unspecified: Secondary | ICD-10-CM | POA: Insufficient documentation

## 2012-06-02 DIAGNOSIS — R51 Headache: Secondary | ICD-10-CM | POA: Insufficient documentation

## 2012-06-02 DIAGNOSIS — I2510 Atherosclerotic heart disease of native coronary artery without angina pectoris: Secondary | ICD-10-CM | POA: Insufficient documentation

## 2012-06-02 DIAGNOSIS — F329 Major depressive disorder, single episode, unspecified: Secondary | ICD-10-CM | POA: Insufficient documentation

## 2012-06-02 DIAGNOSIS — Z87891 Personal history of nicotine dependence: Secondary | ICD-10-CM | POA: Insufficient documentation

## 2012-06-02 DIAGNOSIS — Z794 Long term (current) use of insulin: Secondary | ICD-10-CM | POA: Insufficient documentation

## 2012-06-02 DIAGNOSIS — Z8679 Personal history of other diseases of the circulatory system: Secondary | ICD-10-CM | POA: Insufficient documentation

## 2012-06-02 LAB — CBC WITH DIFFERENTIAL/PLATELET
Basophils Absolute: 0 10*3/uL (ref 0.0–0.1)
Basophils Relative: 0 % (ref 0–1)
Eosinophils Absolute: 0.1 10*3/uL (ref 0.0–0.7)
MCH: 29.8 pg (ref 26.0–34.0)
MCHC: 35.1 g/dL (ref 30.0–36.0)
Neutro Abs: 4.9 10*3/uL (ref 1.7–7.7)
Neutrophils Relative %: 66 % (ref 43–77)
Platelets: 210 10*3/uL (ref 150–400)
RBC: 4.66 MIL/uL (ref 3.87–5.11)

## 2012-06-02 LAB — BASIC METABOLIC PANEL
GFR calc Af Amer: 26 mL/min — ABNORMAL LOW (ref >90)
GFR calc non Af Amer: 22 mL/min — ABNORMAL LOW (ref >90)
Potassium: 2.8 mEq/L — ABNORMAL LOW (ref 3.5–5.1)
Sodium: 139 mEq/L (ref 135–145)

## 2012-06-02 MED ORDER — POTASSIUM CHLORIDE CRYS ER 20 MEQ PO TBCR
40.0000 meq | EXTENDED_RELEASE_TABLET | Freq: Once | ORAL | Status: AC
  Administered 2012-06-02: 40 meq via ORAL
  Filled 2012-06-02: qty 2

## 2012-06-02 MED ORDER — PREDNISONE 20 MG PO TABS
60.0000 mg | ORAL_TABLET | Freq: Once | ORAL | Status: AC
  Administered 2012-06-02: 60 mg via ORAL
  Filled 2012-06-02: qty 3

## 2012-06-02 MED ORDER — DOXYCYCLINE HYCLATE 100 MG PO CAPS: 100.0000 mg | ORAL_CAPSULE | Freq: Two times a day (BID) | ORAL | Status: DC

## 2012-06-02 MED ORDER — HYDRALAZINE HCL 50 MG PO TABS
50.0000 mg | ORAL_TABLET | Freq: Three times a day (TID) | ORAL | Status: DC
  Filled 2012-06-02 (×2): qty 1

## 2012-06-02 MED ORDER — HYDRALAZINE HCL 50 MG PO TABS
50.0000 mg | ORAL_TABLET | Freq: Three times a day (TID) | ORAL | Status: DC
Start: 2012-06-02 — End: 2012-06-02

## 2012-06-02 MED ORDER — ALBUTEROL SULFATE (5 MG/ML) 0.5% IN NEBU
5.0000 mg | INHALATION_SOLUTION | Freq: Once | RESPIRATORY_TRACT | Status: AC
  Administered 2012-06-02: 5 mg via RESPIRATORY_TRACT
  Filled 2012-06-02: qty 1

## 2012-06-02 MED ORDER — HYDRALAZINE HCL 50 MG PO TABS
50.0000 mg | ORAL_TABLET | ORAL | Status: AC
  Administered 2012-06-02: 50 mg via ORAL
  Filled 2012-06-02: qty 1

## 2012-06-02 MED ORDER — IPRATROPIUM BROMIDE 0.02 % IN SOLN
0.5000 mg | Freq: Once | RESPIRATORY_TRACT | Status: AC
  Administered 2012-06-02: 0.5 mg via RESPIRATORY_TRACT
  Filled 2012-06-02: qty 2.5

## 2012-06-02 NOTE — ED Notes (Addendum)
Pt denies SOB at this moment, reports she is only SOB while she is coughing. Pt skin warm and dry, resp e/u. Pt in nad. Pt informed she will be going to CXR shortly.

## 2012-06-02 NOTE — ED Notes (Signed)
Reports not feeling well x 1 week, having non productive cough, headache that occurs when she coughs, and chest pain when she coughs, denies sob.

## 2012-06-02 NOTE — ED Notes (Signed)
Doctor notified of patient's blood pressure.  Patient stated did not take her medication for blood pressure today.  Suppose to take her medication 3 times a day.

## 2012-06-02 NOTE — ED Provider Notes (Signed)
History     CSN: [id number]  Arrival date & time 06/02/12  0865   First MD Initiated Contact with Patient 06/02/12 1137      Chief Complaint  Patient presents with  . Chest Pain  . Cough  . Headache    (Consider location/radiation/quality/duration/timing/severity/associated sxs/prior treatment) HPI Comments: TYRESE FICEK is a 67 y.o. Female who complains of cough for one week. That is nonproductive. She also has a dull headache that occurs only with cough. She has also had chest pain. That occurs only when coughing. She denies fever, chills, nausea, or vomiting. She is taking her usual medications, without relief. There are no modifying factors.  Patient is a 67 y.o. female presenting with chest pain, cough, and headaches. The history is provided by the patient.  Chest Pain Associated symptoms: cough and headache   Cough Associated symptoms: chest pain and headaches   Headache Associated symptoms: cough     Past Medical History  Diagnosis Date  . Palpitations   . History of angina   . HTN (hypertension)   . DM (diabetes mellitus)   . CAD (coronary artery disease)   . Bigeminy   . Obesity   . Renal failure   . Spinal headache   . PONV (postoperative nausea and vomiting)   . Adverse effect of anesthesia 05/2009    MI during cardiac  stent placement  . Angina   . Myocardial infarction 05/2009  . Heart murmur   . Asthma   . Shortness of breath     "w/exertion"  . Hypothyroidism     "started med 01/2011"  . Blood transfusion   . Anemia 02/14/11     "I have taken shots for it; I take iron pills now"  . CHF (congestive heart failure)   . Recurrent upper respiratory infection (URI)     "colds every year"  . Recurrent upper respiratory infection (URI)   . Pneumonia     last time "winter 2011"  . Hiatal hernia   . Depression      "lost son 03/2010"  . GERD (gastroesophageal reflux disease)   . Arthritis   . 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 Atrial fibrillation     Past Surgical History  Procedure Laterality Date  . Cardiac stents.  2010; 05/2009  . Lt vats jan 2012    . Tubal ligation    . Cardiac catheterization      last stent 05/2009; "had MI during this"  . Coronary angioplasty with stent placement  05/2009  . Coronary angioplasty with stent placement  2010  . Abdominal hysterectomy  ~ 1986  . Diagnostic laparoscopy  post 1986    ovaries removed   . Eye surgery  ~ 2009    laser tx   . Fracture surgery  2002    right ankle  . Dilation and curettage of uterus      Family History  Problem Relation Age of Onset  . Cancer      History  Substance Use Topics  . Smoking status: Former Smoker    Quit date: 03/10/1998  . Smokeless tobacco: Never Used  . Alcohol Use: No     Comment: "quit early 1990's"    OB History   Grav Para Term Preterm Abortions TAB SAB Ect Mult Living                  Review of Systems  Respiratory: Positive for cough.  Cardiovascular: Positive for chest pain.  Neurological: Positive for headaches.  All other systems reviewed and are negative.    Allergies  Blue dyes (parenteral); Shrimp; and Erythromycin  Home Medications   Current Outpatient Rx  Name  Route  Sig  Dispense  Refill  . albuterol (PROVENTIL HFA;VENTOLIN HFA) 108 (90 BASE) MCG/ACT inhaler   Inhalation   Inhale 2 puffs into the lungs every 6 (six) hours as needed. For shortness of breath/wheezing          . aspirin EC 81 MG tablet   Oral   Take 81 mg by mouth daily.         Marland Kitchen atorvastatin (LIPITOR) 10 MG tablet   Oral   Take 1 tablet (10 mg total) by mouth daily at 6 PM.   30 tablet   6   . Bimatoprost (LUMIGAN) 0.01 % SOLN   Both Eyes   Place 1 drop into both eyes at bedtime.          . brimonidine (ALPHAGAN) 0.15 % ophthalmic solution   Both Eyes   Place 1 drop into both eyes 2 (two) times daily.          . Carboxymeth-Glycerin-Polysorb (REFRESH OPTIVE ADVANCED) 0.5-1-0.5 %  SOLN   Both Eyes   Place 1 drop into both eyes 3 (three) times daily.         . cyclobenzaprine (FLEXERIL) 5 MG tablet   Oral   Take 5 mg by mouth 2 (two) times daily as needed. For muscle spasms         . ferrous sulfate 325 (65 FE) MG tablet   Oral   Take 325 mg by mouth 2 (two) times daily.           . Fluticasone-Salmeterol (ADVAIR) 500-50 MCG/DOSE AEPB   Inhalation   Inhale 1 puff into the lungs as needed (for shortness of breath).          . furosemide (LASIX) 40 MG tablet   Oral   Take 1 tablet (40 mg total) by mouth daily.   30 tablet   6   . hydrALAZINE (APRESOLINE) 50 MG tablet   Oral   Take 1 tablet (50 mg total) by mouth every 8 (eight) hours.   90 tablet   6   . hydrocodone-acetaminophen (LORCET-HD) 5-500 MG per capsule   Oral   Take 1 capsule by mouth daily as needed. For pain         . insulin glargine (LANTUS) 100 UNIT/ML injection   Subcutaneous   Inject 25 Units into the skin daily after breakfast.          . insulin lispro (HUMALOG) 100 UNIT/ML injection   Subcutaneous   Inject 2-14 Units into the skin 3 (three) times daily between meals as needed. sliding scale-150-175=2 units, 176-200=4 units, 201-225=6 units, 226-250=8 units, 251-300=10 units, 301-350=12 units & >350=14 units & contact doctor         . ipratropium-albuterol (DUONEB) 0.5-2.5 (3) MG/3ML SOLN   Nebulization   Take 3 mLs by nebulization as needed (for wheezing/shortness of breath).         . isosorbide mononitrate (IMDUR) 60 MG 24 hr tablet   Oral   Take 1 tablet (60 mg total) by mouth daily.   30 tablet   11   . metolazone (ZAROXOLYN) 5 MG tablet   Oral   Take 5 mg by mouth daily as needed. Only takes for a weight gain of 5lbs         .  nebivolol (BYSTOLIC) 5 MG tablet   Oral   Take 1 tablet (5 mg total) by mouth daily.   30 tablet   6   . nitroGLYCERIN (NITROSTAT) 0.4 MG SL tablet   Sublingual   Place 0.4 mg under the tongue every 5 (five) minutes as  needed. For chest pain          . omeprazole (PRILOSEC) 20 MG capsule   Oral   Take 20 mg by mouth every morning.          . potassium chloride SA (K-DUR,KLOR-CON) 20 MEQ tablet   Oral   Take 1 tablet (20 mEq total) by mouth 2 (two) times daily.   60 tablet   6   . clopidogrel (PLAVIX) 75 MG tablet   Oral   Take 75 mg by mouth every morning.          Marland Kitchen doxycycline (VIBRAMYCIN) 100 MG capsule   Oral   Take 1 capsule (100 mg total) by mouth 2 (two) times daily.   20 capsule   0   . levofloxacin (LEVAQUIN) 750 MG tablet   Oral   Take 1 tablet (750 mg total) by mouth every other day.   2 tablet   0   . predniSONE (DELTASONE) 10 MG tablet   Oral   Take 4 tablets (40 mg total) by mouth daily. Take 4 tablets on 2/14 Take 3 tablets on 2/15 Take 2 tablets on 2/16 Take 1 tab on 2/17   10 tablet   0     BP 190/53  Pulse 36  Temp(Src) 98.8 F (37.1 C) (Oral)  Resp 19  SpO2 98%  Physical Exam  Nursing note and vitals reviewed. Constitutional: She is oriented to person, place, and time. She appears well-developed and well-nourished.  HENT:  Head: Normocephalic and atraumatic.  Eyes: Conjunctivae and EOM are normal. Pupils are equal, round, and reactive to light.  Neck: Normal range of motion and phonation normal. Neck supple.  Cardiovascular: Normal rate, regular rhythm and intact distal pulses.   Pulmonary/Chest: She is in respiratory distress (Cough. No increased work of breathing). She has wheezes. She has no rales. She exhibits no tenderness.  Abdominal: Soft. She exhibits no distension. There is no tenderness. There is no guarding.  Musculoskeletal: Normal range of motion.  Neurological: She is alert and oriented to person, place, and time. She has normal strength. She exhibits normal muscle tone.  Skin: Skin is warm and dry.  Psychiatric: She has a normal mood and affect. Her behavior is normal. Judgment and thought content normal.    ED Course  Procedures  (including critical care time)  Emergency department treatment: Nebulizer, potassium, hydralazine.  Reevaluation: 15:30. Her vital signs have improved. She is comfortable now. She's not having chest pain.    Date: 01/26/2012  Rate: 79  Rhythm: normal sinus rhythm, frequent PVC  QRS Axis: normal  PR and QT Intervals: normal  ST/T Wave abnormalities: nonspecific T wave changes  PR and QRS Conduction Disutrbances:none  Narrative Interpretation:   Old EKG Reviewed: changes noted-PVC, are new   Labs Reviewed  BASIC METABOLIC PANEL - Abnormal; Notable for the following:    Potassium 2.8 (*)    Glucose, Bld 278 (*)    BUN 76 (*)    Creatinine, Ser 2.18 (*)    GFR calc non Af Amer 22 (*)    GFR calc Af Amer 26 (*)    All other components within normal limits  CBC  WITH DIFFERENTIAL   Dg Chest 2 View  06/02/2012  *RADIOLOGY REPORT*  Clinical Data: Chest pain, cough and headache.  CHEST - 2 VIEW  Comparison: 05/20/2012  Findings: There likely is a component of chronic lung disease with bronchial prominence noted bilaterally as well as stable scarring at the right lung base.  No overt edema or pulmonary consolidation is seen.  Heart size is normal.  A stable implanted cardiac loop recorder device is again noted in the subcutaneous tissues of the left chest wall.  IMPRESSION: Probable chronic lung disease.  No acute findings.   Original Report Authenticated By: Irish Lack, M.D.    Nursing notes, applicable records and vitals reviewed.  Radiologic Images/Reports reviewed.   1. Bronchitis       MDM  Cough, and chest pain, are most likely secondary to bronchitis. The patient has numerous medical problems. She recently had cardiac catheterization and currently has an implanted internal loop recorder. She's not having fever, and there is no oxygen required. Doubt ACS, pneumonia, PE, metabolic instability, or serious bacterial infection. She is improved. Treatment. In emergency  department, and is stable for discharge.     Plan: Home Medications- Doxycycline twice a day x10; Home Treatments- rest; Recommended follow up- PCP, checkup in one week     Flint Melter, MD 06/02/12 1537

## 2012-06-26 ENCOUNTER — Emergency Department (HOSPITAL_COMMUNITY): Payer: Medicare Other

## 2012-06-26 ENCOUNTER — Encounter (HOSPITAL_COMMUNITY): Payer: Self-pay | Admitting: Emergency Medicine

## 2012-06-26 ENCOUNTER — Emergency Department (HOSPITAL_COMMUNITY)
Admission: EM | Admit: 2012-06-26 | Discharge: 2012-06-26 | Disposition: A | Discharge status: Home / Self Care | Payer: Medicare Other | Attending: Emergency Medicine | Admitting: Emergency Medicine

## 2012-06-26 DIAGNOSIS — I2510 Atherosclerotic heart disease of native coronary artery without angina pectoris: Secondary | ICD-10-CM | POA: Insufficient documentation

## 2012-06-26 DIAGNOSIS — Z87891 Personal history of nicotine dependence: Secondary | ICD-10-CM | POA: Insufficient documentation

## 2012-06-26 DIAGNOSIS — Z8739 Personal history of other diseases of the musculoskeletal system and connective tissue: Secondary | ICD-10-CM | POA: Insufficient documentation

## 2012-06-26 DIAGNOSIS — J45909 Unspecified asthma, uncomplicated: Secondary | ICD-10-CM | POA: Insufficient documentation

## 2012-06-26 DIAGNOSIS — Z87448 Personal history of other diseases of urinary system: Secondary | ICD-10-CM | POA: Insufficient documentation

## 2012-06-26 DIAGNOSIS — Z8719 Personal history of other diseases of the digestive system: Secondary | ICD-10-CM | POA: Insufficient documentation

## 2012-06-26 DIAGNOSIS — E669 Obesity, unspecified: Secondary | ICD-10-CM | POA: Insufficient documentation

## 2012-06-26 DIAGNOSIS — Z8659 Personal history of other mental and behavioral disorders: Secondary | ICD-10-CM | POA: Insufficient documentation

## 2012-06-26 DIAGNOSIS — Z8679 Personal history of other diseases of the circulatory system: Secondary | ICD-10-CM | POA: Insufficient documentation

## 2012-06-26 DIAGNOSIS — Z79899 Other long term (current) drug therapy: Secondary | ICD-10-CM | POA: Insufficient documentation

## 2012-06-26 DIAGNOSIS — I509 Heart failure, unspecified: Secondary | ICD-10-CM | POA: Insufficient documentation

## 2012-06-26 DIAGNOSIS — K219 Gastro-esophageal reflux disease without esophagitis: Secondary | ICD-10-CM | POA: Insufficient documentation

## 2012-06-26 DIAGNOSIS — Z8639 Personal history of other endocrine, nutritional and metabolic disease: Secondary | ICD-10-CM | POA: Insufficient documentation

## 2012-06-26 DIAGNOSIS — I10 Essential (primary) hypertension: Secondary | ICD-10-CM | POA: Insufficient documentation

## 2012-06-26 DIAGNOSIS — R011 Cardiac murmur, unspecified: Secondary | ICD-10-CM | POA: Insufficient documentation

## 2012-06-26 DIAGNOSIS — E119 Type 2 diabetes mellitus without complications: Secondary | ICD-10-CM | POA: Insufficient documentation

## 2012-06-26 DIAGNOSIS — Z8701 Personal history of pneumonia (recurrent): Secondary | ICD-10-CM | POA: Insufficient documentation

## 2012-06-26 DIAGNOSIS — Z8709 Personal history of other diseases of the respiratory system: Secondary | ICD-10-CM | POA: Insufficient documentation

## 2012-06-26 DIAGNOSIS — Z794 Long term (current) use of insulin: Secondary | ICD-10-CM | POA: Insufficient documentation

## 2012-06-26 DIAGNOSIS — I252 Old myocardial infarction: Secondary | ICD-10-CM | POA: Insufficient documentation

## 2012-06-26 DIAGNOSIS — R0789 Other chest pain: Secondary | ICD-10-CM

## 2012-06-26 DIAGNOSIS — R071 Chest pain on breathing: Secondary | ICD-10-CM | POA: Insufficient documentation

## 2012-06-26 DIAGNOSIS — R0602 Shortness of breath: Secondary | ICD-10-CM | POA: Insufficient documentation

## 2012-06-26 DIAGNOSIS — Z7982 Long term (current) use of aspirin: Secondary | ICD-10-CM | POA: Insufficient documentation

## 2012-06-26 DIAGNOSIS — IMO0002 Reserved for concepts with insufficient information to code with codable children: Secondary | ICD-10-CM | POA: Insufficient documentation

## 2012-06-26 DIAGNOSIS — Z862 Personal history of diseases of the blood and blood-forming organs and certain disorders involving the immune mechanism: Secondary | ICD-10-CM | POA: Insufficient documentation

## 2012-06-26 LAB — PROTIME-INR: INR: 1.08 (ref 0.00–1.49)

## 2012-06-26 LAB — BASIC METABOLIC PANEL
CO2: 27 mEq/L (ref 19–32)
Chloride: 102 mEq/L (ref 96–112)
GFR calc non Af Amer: 30 mL/min — ABNORMAL LOW (ref >90)
Glucose, Bld: 290 mg/dL — ABNORMAL HIGH (ref 70–99)
Potassium: 3.3 mEq/L — ABNORMAL LOW (ref 3.5–5.1)
Sodium: 141 mEq/L (ref 135–145)

## 2012-06-26 LAB — CBC
Hemoglobin: 11.7 g/dL — ABNORMAL LOW (ref 12.0–15.0)
MCHC: 34 g/dL (ref 30.0–36.0)
RBC: 4.03 MIL/uL (ref 3.87–5.11)
WBC: 6.2 10*3/uL (ref 4.0–10.5)

## 2012-06-26 LAB — POCT I-STAT TROPONIN I: Troponin i, poc: 0.01 ng/mL (ref 0.00–0.08)

## 2012-06-26 NOTE — ED Provider Notes (Signed)
History     CSN: [id number]  Arrival date & time 06/26/12  1503   First MD Initiated Contact with Patient 06/26/12 1637      Chief Complaint  Patient presents with  . Chest Pain    (Consider location/radiation/quality/duration/timing/severity/associated sxs/prior treatment) HPI Comments: Patient comes to the ER for evaluation of chest pain. Patient reports that the pain began earlier today, around 1 PM. She felt short of breath with it. Pain was mostly sharp in nature. They did, however radiate to the left arm and into her back. She has a history of heart disease. Patient denies nausea and diaphoresis associated with the symptoms. It did seem to be worsened by bending over and movements. No clear exertional component, however.  Patient is a 67 y.o. female presenting with chest pain.  Chest Pain Associated symptoms: shortness of breath     Past Medical History  Diagnosis Date  . Palpitations   . History of angina   . HTN (hypertension)   . DM (diabetes mellitus)   . CAD (coronary artery disease)   . Bigeminy   . Obesity   . Renal failure   . Spinal headache   . PONV (postoperative nausea and vomiting)   . Adverse effect of anesthesia 05/2009    MI during cardiac  stent placement  . Angina   . Myocardial infarction 05/2009  . Heart murmur   . Asthma   . Shortness of breath     "w/exertion"  . Hypothyroidism     "started med 01/2011"  . Blood transfusion   . Anemia 02/14/11     "I have taken shots for it; I take iron pills now"  . CHF (congestive heart failure)   . Recurrent upper respiratory infection (URI)     "colds every year"  . Recurrent upper respiratory infection (URI)   . Pneumonia     last time "winter 2011"  . Hiatal hernia   . Depression      "lost son 03/2010"  . GERD (gastroesophageal reflux disease)   . Arthritis   . 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 Atrial fibrillation     Past Surgical History  Procedure Laterality  Date  . Cardiac stents.  2010; 05/2009  . Lt vats jan 2012    . Tubal ligation    . Cardiac catheterization      last stent 05/2009; "had MI during this"  . Coronary angioplasty with stent placement  05/2009  . Coronary angioplasty with stent placement  2010  . Abdominal hysterectomy  ~ 1986  . Diagnostic laparoscopy  post 1986    ovaries removed   . Eye surgery  ~ 2009    laser tx   . Fracture surgery  2002    right ankle  . Dilation and curettage of uterus      Family History  Problem Relation Age of Onset  . Cancer      History  Substance Use Topics  . Smoking status: Former Smoker    Quit date: 03/10/1998  . Smokeless tobacco: Never Used  . Alcohol Use: No     Comment: "quit early 1990's"    OB History   Grav Para Term Preterm Abortions TAB SAB Ect Mult Living                  Review of Systems  Respiratory: Positive for shortness of breath.   Cardiovascular: Positive for chest pain.    Allergies  Blue dyes (parenteral); Shrimp; and Erythromycin  Home Medications   Current Outpatient Rx  Name  Route  Sig  Dispense  Refill  . albuterol (PROVENTIL HFA;VENTOLIN HFA) 108 (90 BASE) MCG/ACT inhaler   Inhalation   Inhale 2 puffs into the lungs every 6 (six) hours as needed for wheezing or shortness of breath.          Marland Kitchen albuterol (PROVENTIL) (2.5 MG/3ML) 0.083% nebulizer solution   Nebulization   Take 2.5 mg by nebulization every 6 (six) hours as needed for wheezing or shortness of breath.         Marland Kitchen aspirin EC 81 MG tablet   Oral   Take 81 mg by mouth daily.         Marland Kitchen atorvastatin (LIPITOR) 10 MG tablet   Oral   Take 1 tablet (10 mg total) by mouth daily at 6 PM.   30 tablet   6   . Bimatoprost (LUMIGAN) 0.01 % SOLN   Both Eyes   Place 1 drop into both eyes at bedtime.          . brimonidine (ALPHAGAN) 0.15 % ophthalmic solution   Both Eyes   Place 1 drop into both eyes 2 (two) times daily.          . Carboxymeth-Glycerin-Polysorb  (REFRESH OPTIVE ADVANCED) 0.5-1-0.5 % SOLN   Both Eyes   Place 1 drop into both eyes 3 (three) times daily.         . clopidogrel (PLAVIX) 75 MG tablet   Oral   Take 75 mg by mouth every morning.          . cyclobenzaprine (FLEXERIL) 5 MG tablet   Oral   Take 5 mg by mouth 2 (two) times daily as needed for muscle spasms.          Marland Kitchen doxycycline (VIBRAMYCIN) 100 MG capsule   Oral   Take 1 capsule (100 mg total) by mouth 2 (two) times daily.   20 capsule   0   . ferrous sulfate 325 (65 FE) MG tablet   Oral   Take 325 mg by mouth 2 (two) times daily.           . Fluticasone-Salmeterol (ADVAIR) 500-50 MCG/DOSE AEPB   Inhalation   Inhale 1 puff into the lungs every 12 (twelve) hours.          . furosemide (LASIX) 40 MG tablet   Oral   Take 1 tablet (40 mg total) by mouth daily.   30 tablet   6   . hydrALAZINE (APRESOLINE) 50 MG tablet   Oral   Take 1 tablet (50 mg total) by mouth every 8 (eight) hours.   90 tablet   6   . hydrocodone-acetaminophen (LORCET-HD) 5-500 MG per capsule   Oral   Take 1 capsule by mouth every 6 (six) hours as needed for pain.          Marland Kitchen insulin glargine (LANTUS) 100 UNIT/ML injection   Subcutaneous   Inject 25 Units into the skin daily after breakfast.          . insulin lispro (HUMALOG) 100 UNIT/ML injection   Subcutaneous   Inject 2-14 Units into the skin 3 (three) times daily between meals as needed. sliding scale-150-175=2 units, 176-200=4 units, 201-225=6 units, 226-250=8 units, 251-300=10 units, 301-350=12 units & >350=14 units & contact doctor         . ipratropium-albuterol (DUONEB) 0.5-2.5 (3) MG/3ML SOLN   Nebulization  Take 3 mLs by nebulization every 6 (six) hours as needed (for wheezing/shortness of breath).          . isosorbide mononitrate (IMDUR) 60 MG 24 hr tablet   Oral   Take 1 tablet (60 mg total) by mouth daily.   30 tablet   11   . metolazone (ZAROXOLYN) 5 MG tablet   Oral   Take 5 mg by mouth  daily as needed. Only takes for a weight gain of 5lbs         . nebivolol (BYSTOLIC) 5 MG tablet   Oral   Take 1 tablet (5 mg total) by mouth daily.   30 tablet   6   . nitroGLYCERIN (NITROSTAT) 0.4 MG SL tablet   Sublingual   Place 0.4 mg under the tongue every 5 (five) minutes as needed for chest pain.          Marland Kitchen omeprazole (PRILOSEC) 20 MG capsule   Oral   Take 20 mg by mouth every morning.          . potassium chloride SA (K-DUR,KLOR-CON) 20 MEQ tablet   Oral   Take 1 tablet (20 mEq total) by mouth 2 (two) times daily.   60 tablet   6     BP 191/70  Pulse 64  Temp(Src) 98.7 F (37.1 C) (Oral)  Resp 16  SpO2 98%  Physical Exam  Constitutional: She is oriented to person, place, and time. She appears well-developed and well-nourished. No distress.  HENT:  Head: Normocephalic and atraumatic.  Right Ear: Hearing normal.  Nose: Nose normal.  Mouth/Throat: Oropharynx is clear and moist and mucous membranes are normal.  Eyes: Conjunctivae and EOM are normal. Pupils are equal, round, and reactive to light.  Neck: Normal range of motion. Neck supple.  Cardiovascular: Normal rate, regular rhythm, S1 normal and S2 normal.  Exam reveals no gallop and no friction rub.   No murmur heard. Pulmonary/Chest: Effort normal and breath sounds normal. No respiratory distress. She exhibits no tenderness.    Abdominal: Soft. Normal appearance and bowel sounds are normal. There is no hepatosplenomegaly. There is no tenderness. There is no rebound, no guarding, no tenderness at McBurney's point and negative Murphy's sign. No hernia.  Musculoskeletal: Normal range of motion.  Neurological: She is alert and oriented to person, place, and time. She has normal strength. No cranial nerve deficit or sensory deficit. Coordination normal. GCS eye subscore is 4. GCS verbal subscore is 5. GCS motor subscore is 6.  Skin: Skin is warm, dry and intact. No rash noted. No cyanosis.  Psychiatric: She  has a normal mood and affect. Her speech is normal and behavior is normal. Thought content normal.    ED Course  Procedures (including critical care time)   Date: 06/26/2012  Rate: 66   Rhythm: normal sinus rhythm  QRS Axis: normal  Intervals: normal  ST/T Wave abnormalities: nonspecific ST/T changes  Conduction Disutrbances:none  Narrative Interpretation:   Old EKG Reviewed: unchanged    Labs Reviewed  CBC - Abnormal; Notable for the following:    Hemoglobin 11.7 (*)    HCT 34.4 (*)    All other components within normal limits  BASIC METABOLIC PANEL - Abnormal; Notable for the following:    Potassium 3.3 (*)    Glucose, Bld 290 (*)    BUN 26 (*)    Creatinine, Ser 1.70 (*)    GFR calc non Af Amer 30 (*)    GFR calc  Af Amer 35 (*)    All other components within normal limits  PROTIME-INR  POCT I-STAT TROPONIN I   Dg Chest 2 View  06/26/2012  *RADIOLOGY REPORT*  Clinical Data: Chest pain.  CHEST - 2 VIEW  Comparison: 06/02/2012  Findings: Cardiomegaly, cardiac monitor, and surgical clips overlying the left hilar region again noted. Peribronchial thickening is unchanged. Elevation of the left hemidiaphragm is again noted. There is no evidence of focal airspace disease, pulmonary edema, suspicious pulmonary nodule/mass, pleural effusion, or pneumothorax. No acute bony abnormalities are identified.  IMPRESSION: Cardiomegaly without evidence of acute cardiopulmonary disease.  Chronic peribronchial thickening.   Original Report Authenticated By: Harmon Pier, M.D.      Diagnosis: Musculoskeletal chest pain    MDM  Patient comes to the ER for evaluation of chest pain. Patient had sharp chest pain that started around 1 PM today. At the time of my evaluation the ER, the pain had subsided. 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 Patient did, however just have a heart catheterization last several months and  there was no obstructions. Patient's symptoms are atypical. Patient is experiencing sharp pain in the Center of her chest that is reproducible. She is still tender here in the ER, although not having pain at rest. I palpated her chest, it does cause the pain to be similar to what she was experiencing prior to the ER. Patient has no changes her EKG and a normal troponin at arrival. Repeat troponin was also negative. The fact that her symptoms are so atypical, she does have a negative heart catheterization, and she has had 2 negative troponins is very reassuring. She'll be discharged, followup as an outpatient with her doctors.        Gilda Crease, MD 06/26/12 [id number]

## 2012-06-26 NOTE — ED Notes (Signed)
Dr. Pollina at the bedside.  

## 2012-06-26 NOTE — ED Notes (Signed)
C/o L sided sharp chest pain that radiates down L arm since 1pm.  Denies sob, nausea, vomiting, or any other symptoms.  States she was sitting down when symptoms started.

## 2012-07-16 ENCOUNTER — Encounter: Payer: Self-pay | Admitting: *Deleted

## 2012-07-16 ENCOUNTER — Encounter: Payer: Self-pay | Admitting: Cardiovascular Disease

## 2012-07-24 ENCOUNTER — Encounter (HOSPITAL_COMMUNITY): Payer: Self-pay | Admitting: Family Medicine

## 2012-07-24 ENCOUNTER — Emergency Department (HOSPITAL_COMMUNITY): Payer: PRIVATE HEALTH INSURANCE

## 2012-07-24 ENCOUNTER — Emergency Department (HOSPITAL_COMMUNITY)
Admission: EM | Admit: 2012-07-24 | Discharge: 2012-07-24 | Disposition: A | Discharge status: Home / Self Care | Payer: PRIVATE HEALTH INSURANCE | Attending: Internal Medicine | Admitting: Internal Medicine

## 2012-07-24 DIAGNOSIS — N289 Disorder of kidney and ureter, unspecified: Secondary | ICD-10-CM | POA: Insufficient documentation

## 2012-07-24 DIAGNOSIS — Z8659 Personal history of other mental and behavioral disorders: Secondary | ICD-10-CM | POA: Insufficient documentation

## 2012-07-24 DIAGNOSIS — I252 Old myocardial infarction: Secondary | ICD-10-CM | POA: Insufficient documentation

## 2012-07-24 DIAGNOSIS — Z8709 Personal history of other diseases of the respiratory system: Secondary | ICD-10-CM | POA: Insufficient documentation

## 2012-07-24 DIAGNOSIS — R011 Cardiac murmur, unspecified: Secondary | ICD-10-CM | POA: Insufficient documentation

## 2012-07-24 DIAGNOSIS — D649 Anemia, unspecified: Secondary | ICD-10-CM | POA: Insufficient documentation

## 2012-07-24 DIAGNOSIS — I509 Heart failure, unspecified: Secondary | ICD-10-CM | POA: Insufficient documentation

## 2012-07-24 DIAGNOSIS — J45909 Unspecified asthma, uncomplicated: Secondary | ICD-10-CM | POA: Insufficient documentation

## 2012-07-24 DIAGNOSIS — Z8679 Personal history of other diseases of the circulatory system: Secondary | ICD-10-CM | POA: Insufficient documentation

## 2012-07-24 DIAGNOSIS — E1169 Type 2 diabetes mellitus with other specified complication: Secondary | ICD-10-CM

## 2012-07-24 DIAGNOSIS — Z7982 Long term (current) use of aspirin: Secondary | ICD-10-CM | POA: Insufficient documentation

## 2012-07-24 DIAGNOSIS — Z8669 Personal history of other diseases of the nervous system and sense organs: Secondary | ICD-10-CM | POA: Insufficient documentation

## 2012-07-24 DIAGNOSIS — E119 Type 2 diabetes mellitus without complications: Secondary | ICD-10-CM | POA: Insufficient documentation

## 2012-07-24 DIAGNOSIS — Z8701 Personal history of pneumonia (recurrent): Secondary | ICD-10-CM | POA: Insufficient documentation

## 2012-07-24 DIAGNOSIS — I4891 Unspecified atrial fibrillation: Secondary | ICD-10-CM

## 2012-07-24 DIAGNOSIS — E669 Obesity, unspecified: Secondary | ICD-10-CM | POA: Insufficient documentation

## 2012-07-24 DIAGNOSIS — Z87891 Personal history of nicotine dependence: Secondary | ICD-10-CM | POA: Insufficient documentation

## 2012-07-24 DIAGNOSIS — Z79899 Other long term (current) drug therapy: Secondary | ICD-10-CM | POA: Insufficient documentation

## 2012-07-24 DIAGNOSIS — I480 Paroxysmal atrial fibrillation: Secondary | ICD-10-CM

## 2012-07-24 DIAGNOSIS — E1159 Type 2 diabetes mellitus with other circulatory complications: Secondary | ICD-10-CM

## 2012-07-24 DIAGNOSIS — E876 Hypokalemia: Secondary | ICD-10-CM | POA: Insufficient documentation

## 2012-07-24 DIAGNOSIS — E039 Hypothyroidism, unspecified: Secondary | ICD-10-CM | POA: Insufficient documentation

## 2012-07-24 DIAGNOSIS — Z7902 Long term (current) use of antithrombotics/antiplatelets: Secondary | ICD-10-CM | POA: Insufficient documentation

## 2012-07-24 DIAGNOSIS — I10 Essential (primary) hypertension: Secondary | ICD-10-CM

## 2012-07-24 DIAGNOSIS — M129 Arthropathy, unspecified: Secondary | ICD-10-CM | POA: Insufficient documentation

## 2012-07-24 DIAGNOSIS — R0789 Other chest pain: Secondary | ICD-10-CM

## 2012-07-24 DIAGNOSIS — Z794 Long term (current) use of insulin: Secondary | ICD-10-CM | POA: Insufficient documentation

## 2012-07-24 DIAGNOSIS — Z9861 Coronary angioplasty status: Secondary | ICD-10-CM | POA: Insufficient documentation

## 2012-07-24 DIAGNOSIS — R071 Chest pain on breathing: Secondary | ICD-10-CM | POA: Insufficient documentation

## 2012-07-24 DIAGNOSIS — I2510 Atherosclerotic heart disease of native coronary artery without angina pectoris: Secondary | ICD-10-CM | POA: Insufficient documentation

## 2012-07-24 DIAGNOSIS — K219 Gastro-esophageal reflux disease without esophagitis: Secondary | ICD-10-CM | POA: Insufficient documentation

## 2012-07-24 LAB — POCT I-STAT TROPONIN I: Troponin i, poc: 0.02 ng/mL (ref 0.00–0.08)

## 2012-07-24 LAB — CBC
HCT: 33.8 % — ABNORMAL LOW (ref 36.0–46.0)
Hemoglobin: 11.6 g/dL — ABNORMAL LOW (ref 12.0–15.0)
MCH: 29.3 pg (ref 26.0–34.0)
MCHC: 34.3 g/dL (ref 30.0–36.0)
MCV: 85.4 fL (ref 78.0–100.0)
RBC: 3.96 MIL/uL (ref 3.87–5.11)

## 2012-07-24 LAB — PROTIME-INR: Prothrombin Time: 14.4 seconds (ref 11.6–15.2)

## 2012-07-24 LAB — BASIC METABOLIC PANEL
BUN: 38 mg/dL — ABNORMAL HIGH (ref 6–23)
CO2: 28 mEq/L (ref 19–32)
Calcium: 9.5 mg/dL (ref 8.4–10.5)
Glucose, Bld: 133 mg/dL — ABNORMAL HIGH (ref 70–99)
Sodium: 144 mEq/L (ref 135–145)

## 2012-07-24 MED ORDER — FUROSEMIDE 10 MG/ML IJ SOLN: 40.0000 mg | Freq: Once | INTRAMUSCULAR | Status: DC

## 2012-07-24 MED ORDER — POTASSIUM CHLORIDE 20 MEQ/15ML (10%) PO LIQD
40.0000 meq | Freq: Once | ORAL | Status: AC
  Administered 2012-07-24: 40 meq via ORAL
  Filled 2012-07-24: qty 30

## 2012-07-24 NOTE — ED Notes (Signed)
Patient given discharge instructions, no rx was provided. Advised to follow up with doctor in two days. Patient voiced understanding of all instructions. Patient taken to lobby via wheelchair.

## 2012-07-24 NOTE — ED Notes (Signed)
Patient ambulating to restroom at this time.

## 2012-07-24 NOTE — Consult Note (Signed)
[facility] History and Physical  Wanda [name] WUJ:[id number] DOB: October 15, 1945    PCP:   Katy Apo, MD   Chief Complaint: chest pain. Reason for the consult:  CHF needing inpatient diuresis.  HPI: Wanda [name] is an 67 y.o. female with hx of DM, HTN, CAD, Afib, CHF, CKD 3 presents to the ER with atypical chest pain.  Her pain was left sided with movement.  She has no shortness of breath, nausea, vomiting or diaphoresis.  She had bilateral leg swelling, and has taken extra doses of oral lasix, and the swelling has improved.  No calf tenderness and no pleuritic CP.  Evaluation in the ER included an elevated BNP of over 2000, a Cr of 2.3 (baseline 2.0-), EKG showed no acute ischemic changes.  She had normal WBC and Hb, and BS was 133.  CXR showed no vascular congestion, with retrocardiac atelectasis or infiltrate, she has had no fever, coughs, or symptoms suggestive of PNA.  She had several heart caths, and the last one 11/13 showing no coronary obtruction.  She is comfortable when I saw her in the ER.  Rewiew of Systems:  Constitutional: Negative for malaise, fever and chills. No significant weight loss or weight gain Eyes: Negative for eye pain, redness and discharge, diplopia, visual changes, or flashes of light. ENMT: Negative for ear pain, hoarseness, nasal congestion, sinus pressure and sore throat. No headaches; tinnitus, drooling, or problem swallowing. Cardiovascular: Negative for chest pain, palpitations, diaphoresis, dyspnea and peripheral edema. ; No orthopnea, PND Respiratory: Negative for cough, hemoptysis, wheezing and stridor. No pleuritic chestpain. Gastrointestinal: Negative for nausea, vomiting, diarrhea, constipation, abdominal pain, melena, blood in stool, hematemesis, jaundice and rectal bleeding.    Genitourinary: Negative for frequency, dysuria, incontinence,flank pain and hematuria; Musculoskeletal: Negative for back pain and neck pain. Negative for  swelling and trauma.;  Skin: . Negative for pruritus, rash, abrasions, bruising and skin lesion.; ulcerations Neuro: Negative for headache, lightheadedness and neck stiffness. Negative for weakness, altered level of consciousness , altered mental status, extremity weakness, burning feet, involuntary movement, seizure and syncope.  Psych: negative for anxiety, depression, insomnia, tearfulness, panic attacks, hallucinations, paranoia, suicidal or homicidal ideation   Past Medical History  Diagnosis Date  . Palpitations   . History of angina   . HTN (hypertension)   . DM (diabetes mellitus)   . CAD (coronary artery disease)   . Bigeminy   . Obesity   . Renal failure   . Spinal headache   . PONV (postoperative nausea and vomiting)   . Adverse effect of anesthesia 05/2009    MI during cardiac  stent placement  . Angina   . Myocardial infarction 05/2009  . Heart murmur   . Asthma   . Shortness of breath     "w/exertion"  . Hypothyroidism     "started med 01/2011"  . Blood transfusion   . Anemia 02/14/11     "I have taken shots for it; I take iron pills now"  . CHF (congestive heart failure)   . Recurrent upper respiratory infection (URI)     "colds every year"  . Pneumonia     last time "winter 2011"  . Hiatal hernia   . Depression      "lost son 03/2010"  . GERD (gastroesophageal reflux disease)   . Arthritis   . 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 Atrial fibrillation     Past Surgical History  Procedure Laterality Date  .  Cardiac stents.  2010; 05/2009  . Lt vats jan 2012    . Tubal ligation    . Cardiac catheterization      last stent 05/2009; "had MI during this"  . Coronary angioplasty with stent placement  05/2009  . Coronary angioplasty with stent placement  2010  . Abdominal hysterectomy  ~ 1986  . Diagnostic laparoscopy  post 1986    ovaries removed   . Eye surgery  ~ 2009    laser tx   . Fracture surgery  2002    right ankle  .  Dilation and curettage of uterus    . Loop recorder implant  03/01/12    [employer]    Medications:  HOME MEDS: Prior to Admission medications   Medication Sig Start Date End Date Taking? Authorizing Provider  albuterol (PROVENTIL HFA;VENTOLIN HFA) 108 (90 BASE) MCG/ACT inhaler Inhale 2 puffs into the lungs every 6 (six) hours as needed for wheezing or shortness of breath.    Yes Historical Provider, MD  aspirin EC 81 MG tablet Take 81 mg by mouth daily.   Yes Historical Provider, MD  atorvastatin (LIPITOR) 10 MG tablet Take 1 tablet (10 mg total) by mouth daily at 6 PM. 03/03/12  Yes Nada Boozer, NP  Bimatoprost (LUMIGAN) 0.01 % SOLN Place 1 drop into both eyes at bedtime.    Yes Historical Provider, MD  brimonidine (ALPHAGAN) 0.15 % ophthalmic solution Place 1 drop into both eyes 2 (two) times daily.    Yes Historical Provider, MD  Carboxymeth-Glycerin-Polysorb (REFRESH OPTIVE ADVANCED) 0.5-1-0.5 % SOLN Place 1 drop into both eyes 3 (three) times daily.   Yes Historical Provider, MD  clopidogrel (PLAVIX) 75 MG tablet Take 75 mg by mouth every morning.    Yes Historical Provider, MD  ferrous sulfate 325 (65 FE) MG tablet Take 325 mg by mouth 2 (two) times daily.     Yes Historical Provider, MD  Fluticasone-Salmeterol (ADVAIR) 500-50 MCG/DOSE AEPB Inhale 1 puff into the lungs every 12 (twelve) hours.    Yes Historical Provider, MD  furosemide (LASIX) 40 MG tablet Take 1 tablet (40 mg total) by mouth daily. 03/03/12  Yes Nada Boozer, NP  hydrALAZINE (APRESOLINE) 50 MG tablet Take 1 tablet (50 mg total) by mouth every 8 (eight) hours. 03/03/12  Yes Nada Boozer, NP  insulin glargine (LANTUS) 100 UNIT/ML injection Inject 50 Units into the skin daily after breakfast.    Yes Historical Provider, MD  insulin lispro (HUMALOG) 100 UNIT/ML injection Inject 2-14 Units into the skin 3 (three) times daily between meals as needed. sliding scale-150-175=2 units, 176-200=4 units, 201-225=6 units, 226-250=8  units, 251-300=10 units, 301-350=12 units & >350=14 units & contact doctor   Yes Historical Provider, MD  ipratropium-albuterol (DUONEB) 0.5-2.5 (3) MG/3ML SOLN Take 3 mLs by nebulization every 6 (six) hours as needed (for wheezing/shortness of breath).    Yes Historical Provider, MD  isosorbide mononitrate (IMDUR) 60 MG 24 hr tablet Take 1 tablet (60 mg total) by mouth daily. 03/03/12  Yes Nada Boozer, NP  levothyroxine (SYNTHROID, LEVOTHROID) 100 MCG tablet Take 100 mcg by mouth daily before breakfast.   Yes Historical Provider, MD  metolazone (ZAROXOLYN) 5 MG tablet Take 5 mg by mouth daily as needed. Only takes for a weight gain of 5lbs   Yes Historical Provider, MD  nebivolol (BYSTOLIC) 5 MG tablet Take 1 tablet (5 mg total) by mouth daily. 03/03/12  Yes Nada Boozer, NP  nitroGLYCERIN (NITROSTAT) 0.4 MG SL tablet Place  0.4 mg under the tongue every 5 (five) minutes as needed for chest pain.    Yes Historical Provider, MD  omeprazole (PRILOSEC) 20 MG capsule Take 20 mg by mouth every morning.    Yes Historical Provider, MD  potassium chloride SA (K-DUR,KLOR-CON) 20 MEQ tablet Take 1 tablet (20 mEq total) by mouth 2 (two) times daily. 03/03/12  Yes Nada Boozer, NP  cyclobenzaprine (FLEXERIL) 5 MG tablet Take 5 mg by mouth 2 (two) times daily as needed for muscle spasms.     Historical Provider, MD  hydrocodone-acetaminophen (LORCET-HD) 5-500 MG per capsule Take 1 capsule by mouth every 6 (six) hours as needed for pain.     Historical Provider, MD     Allergies:  Allergies  Allergen Reactions  . Blue Dyes (Parenteral) Swelling       . Other     Cardiologist told patient not to take anything that affects "QT"  . Shrimp (Shellfish Allergy)     ONLY had a reaction once (face swelled up)---has been able to eat shrimp and shellfish every other time   . Erythromycin Palpitations    Social History:   reports that she quit smoking about 14 years ago. She has never used smokeless tobacco. She  reports that she does not drink alcohol or use illicit drugs.  Family History: Family History  Problem Relation Age of Onset  . Cancer       Physical Exam: Filed Vitals:   07/24/12 1847 07/24/12 1954 07/24/12 2000  BP: 220/86 204/95 195/81  Pulse: 73 66 34  Temp: 99.2 F (37.3 C)    TempSrc: Oral    Resp: 16 17 17   SpO2: 98% 99% 100%   Blood pressure 195/81, pulse 34, temperature 99.2 F (37.3 C), temperature source Oral, resp. rate 17, SpO2 100.00%.  GEN:  Pleasant  patient lying in the stretcher in no acute distress; cooperative with exam. PSYCH:  alert and oriented x4; does not appear anxious or depressed; affect is appropriate. HEENT: Mucous membranes pink and anicteric; PERRLA; EOM intact; no cervical lymphadenopathy nor thyromegaly or carotid bruit; no JVD; There were no stridor. Neck is very supple. Breasts:: Not examined CHEST WALL: No tenderness CHEST: Normal respiration, clear to auscultation bilaterally.  HEART: Regular rate and rhythm.  There are no murmur, rub, or gallops.   BACK: No kyphosis or scoliosis; no CVA tenderness ABDOMEN: soft and non-tender; no masses, no organomegaly, normal abdominal bowel sounds; no pannus; no intertriginous candida. There is no rebound and no distention. Rectal Exam: Not done EXTREMITIES: No bone or joint deformity; age-appropriate arthropathy of the hands and knees; no edema; no ulcerations.  There is no calf tenderness. Genitalia: not examined PULSES: 2+ and symmetric SKIN: Normal hydration no rash or ulceration CNS: Cranial nerves 2-12 grossly intact no focal lateralizing neurologic deficit.  Speech is fluent; uvula elevated with phonation, facial symmetry and tongue midline. DTR are normal bilaterally, cerebella exam is intact, barbinski is negative and strengths are equaled bilaterally.  No sensory loss.   Labs on Admission:  Basic Metabolic Panel:  Recent Labs Lab 07/24/12 1854  NA 144  K 3.1*  CL 106  CO2 28   GLUCOSE 133*  BUN 38*  CREATININE 2.32*  CALCIUM 9.5   Liver Function Tests: No results found for this basename: AST, ALT, ALKPHOS, BILITOT, PROT, ALBUMIN,  in the last 168 hours No results found for this basename: LIPASE, AMYLASE,  in the last 168 hours No results found for this basename:  AMMONIA,  in the last 168 hours CBC:  Recent Labs Lab 07/24/12 1854  WBC 5.9  HGB 11.6*  HCT 33.8*  MCV 85.4  PLT 209   Cardiac Enzymes: No results found for this basename: CKTOTAL, CKMB, CKMBINDEX, TROPONINI,  in the last 168 hours  CBG: No results found for this basename: GLUCAP,  in the last 168 hours   Radiological Exams on Admission: Dg Chest Port 1 View  07/24/2012  *RADIOLOGY REPORT*  Clinical Data: Prior chest pain and shortness of breath  PORTABLE CHEST - 1 VIEW  Comparison: 06/26/2012  Findings: The cardiopericardial silhouette is enlarged. Interstitial markings are diffusely coarsened with chronic features.  There is retrocardiac atelectasis or infiltrate with stable appearance of chronic left-sided pleural thickening.  Loop recorder overlies the left heart. Imaged bony structures of the thorax are intact.  IMPRESSION: Cardiomegaly with underlying chronic interstitial changes and retrocardiac atelectasis or infiltrate.  Stable pleural thickening in the left base.   Original Report Authenticated By: Kennith Center, M.D.     EKG: Independently reviewed. No acute ischemic changes.   Assessment/Plan Atypical chest pain, chest wall pain. Hx of CAD, last cath 11/13 with no obtructive coronary. CHF, stable Leg swelling, improved. CKD 3, Cr slightly elevated.  PLAN:  I don't think she should get further diuresed, given her GFR is low and her Cr is starting to rise with increase oral lasix.  I would recommend that she resume her oral lasix at her base dose.  She is not acute CHF and I would recommend against overdiuresing her.  Her chest pain has been long standing, atypical, and likely  chest wall pain.  She has no infection, with CXR showing no vascular congestion.  I recommended that she resumes her home meds and follow up with her PCP and cardiologist.  Strict criteria for returning to the ER given, and she can be discharged home.  Thank you for asking me to consult on your nice patient.  Other plans as per orders.   Houston Siren, MD. [facility] Pager [phone] 7pm to 7am.  07/24/2012, 8:58 PM

## 2012-07-24 NOTE — ED Notes (Addendum)
Patient laying on stretcher at this time. Patient complaining of chest pain in center of chest that is coming and going. States the pain is radiating to her back and describes as an ache and sharp. Pain began around 0400 this afternoon. Plan of care discussed with patient. Call light at bedside. Will continue to monitor.

## 2012-07-24 NOTE — ED Provider Notes (Signed)
History     CSN: [id number]  Arrival date & time 07/24/12  1843   First MD Initiated Contact with Patient 07/24/12 1943      Chief Complaint  Patient presents with  . Chest Pain     HPI Pt was seen at 1955.   Per pt, c/o gradual onset and persistence of multiple intermittent episodes of right sided chest "pain" that began approx 1600 PTA.  Pt describes the pain as "sharp jolts" that last several seconds to a minute each episode.  Pain worsens with palpation of the area and movement of her torso.  Pt also states her "legs have been really swollen" for the past several days, and she has been "doubling up my water pill."  States her legs swelling has improved. Denies SOB/cough, no abd pain, no back pain, no palpitations, no N/V/D, no fevers, no focal motor weakness, no tingling/numbness in extremities, no calf pain or unilateral swelling.     Past Medical History  Diagnosis Date  . Palpitations   . History of angina   . HTN (hypertension)   . DM (diabetes mellitus)   . CAD (coronary artery disease)   . Bigeminy   . Obesity   . Renal failure   . Spinal headache   . PONV (postoperative nausea and vomiting)   . Adverse effect of anesthesia 05/2009    MI during cardiac  stent placement  . Angina   . Myocardial infarction 05/2009  . Heart murmur   . Asthma   . Shortness of breath     "w/exertion"  . Hypothyroidism     "started med 01/2011"  . Blood transfusion   . Anemia 02/14/11     "I have taken shots for it; I take iron pills now"  . CHF (congestive heart failure)   . Recurrent upper respiratory infection (URI)     "colds every year"  . Pneumonia     last time "winter 2011"  . Hiatal hernia   . Depression      "lost son 03/2010"  . GERD (gastroesophageal reflux disease)   . Arthritis   . 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 Atrial fibrillation     Past Surgical History  Procedure Laterality Date  . Cardiac stents.  2010; 05/2009  . Lt vats  jan 2012    . Tubal ligation    . Cardiac catheterization      last stent 05/2009; "had MI during this"  . Coronary angioplasty with stent placement  05/2009  . Coronary angioplasty with stent placement  2010  . Abdominal hysterectomy  ~ 1986  . Diagnostic laparoscopy  post 1986    ovaries removed   . Eye surgery  ~ 2009    laser tx   . Fracture surgery  2002    right ankle  . Dilation and curettage of uterus    . Loop recorder implant  03/01/12    [employer]    Family History  Problem Relation Age of Onset  . Cancer      History  Substance Use Topics  . Smoking status: Former Smoker    Quit date: 03/10/1998  . Smokeless tobacco: Never Used  . Alcohol Use: No     Comment: "quit early 1990's"    Review of Systems ROS: Statement: All systems negative except as marked or noted in the HPI; Constitutional: Negative for fever and chills. ; ; Eyes: Negative for eye pain, redness and discharge. ; ;  ENMT: Negative for ear pain, hoarseness, nasal congestion, sinus pressure and sore throat. ; ; Cardiovascular: Negative for palpitations, diaphoresis, dyspnea and +CP, +peripheral edema. ; ; Respiratory: Negative for cough, wheezing and stridor. ; ; Gastrointestinal: Negative for nausea, vomiting, diarrhea, abdominal pain, blood in stool, hematemesis, jaundice and rectal bleeding. . ; ; Genitourinary: Negative for dysuria, flank pain and hematuria. ; ; Musculoskeletal: Negative for back pain and neck pain. Negative for swelling and trauma.; ; Skin: Negative for pruritus, rash, abrasions, blisters, bruising and skin lesion.; ; Neuro: Negative for headache, lightheadedness and neck stiffness. Negative for weakness, altered level of consciousness , altered mental status, extremity weakness, paresthesias, involuntary movement, seizure and syncope.       Allergies  Blue dyes (parenteral); Other; Shrimp; and Erythromycin  Home Medications   Current Outpatient Rx  Name  Route  Sig  Dispense   Refill  . albuterol (PROVENTIL HFA;VENTOLIN HFA) 108 (90 BASE) MCG/ACT inhaler   Inhalation   Inhale 2 puffs into the lungs every 6 (six) hours as needed for wheezing or shortness of breath.          Marland Kitchen aspirin EC 81 MG tablet   Oral   Take 81 mg by mouth daily.         Marland Kitchen atorvastatin (LIPITOR) 10 MG tablet   Oral   Take 1 tablet (10 mg total) by mouth daily at 6 PM.   30 tablet   6   . Bimatoprost (LUMIGAN) 0.01 % SOLN   Both Eyes   Place 1 drop into both eyes at bedtime.          . brimonidine (ALPHAGAN) 0.15 % ophthalmic solution   Both Eyes   Place 1 drop into both eyes 2 (two) times daily.          . Carboxymeth-Glycerin-Polysorb (REFRESH OPTIVE ADVANCED) 0.5-1-0.5 % SOLN   Both Eyes   Place 1 drop into both eyes 3 (three) times daily.         . clopidogrel (PLAVIX) 75 MG tablet   Oral   Take 75 mg by mouth every morning.          . ferrous sulfate 325 (65 FE) MG tablet   Oral   Take 325 mg by mouth 2 (two) times daily.           . Fluticasone-Salmeterol (ADVAIR) 500-50 MCG/DOSE AEPB   Inhalation   Inhale 1 puff into the lungs every 12 (twelve) hours.          . furosemide (LASIX) 40 MG tablet   Oral   Take 1 tablet (40 mg total) by mouth daily.   30 tablet   6   . hydrALAZINE (APRESOLINE) 50 MG tablet   Oral   Take 1 tablet (50 mg total) by mouth every 8 (eight) hours.   90 tablet   6   . insulin glargine (LANTUS) 100 UNIT/ML injection   Subcutaneous   Inject 50 Units into the skin daily after breakfast.          . insulin lispro (HUMALOG) 100 UNIT/ML injection   Subcutaneous   Inject 2-14 Units into the skin 3 (three) times daily between meals as needed. sliding scale-150-175=2 units, 176-200=4 units, 201-225=6 units, 226-250=8 units, 251-300=10 units, 301-350=12 units & >350=14 units & contact doctor         . ipratropium-albuterol (DUONEB) 0.5-2.5 (3) MG/3ML SOLN   Nebulization   Take 3 mLs by nebulization every 6 (six) hours as  needed (for wheezing/shortness of breath).          . isosorbide mononitrate (IMDUR) 60 MG 24 hr tablet   Oral   Take 1 tablet (60 mg total) by mouth daily.   30 tablet   11   . levothyroxine (SYNTHROID, LEVOTHROID) 100 MCG tablet   Oral   Take 100 mcg by mouth daily before breakfast.         . metolazone (ZAROXOLYN) 5 MG tablet   Oral   Take 5 mg by mouth daily as needed. Only takes for a weight gain of 5lbs         . nebivolol (BYSTOLIC) 5 MG tablet   Oral   Take 1 tablet (5 mg total) by mouth daily.   30 tablet   6   . nitroGLYCERIN (NITROSTAT) 0.4 MG SL tablet   Sublingual   Place 0.4 mg under the tongue every 5 (five) minutes as needed for chest pain.          Marland Kitchen omeprazole (PRILOSEC) 20 MG capsule   Oral   Take 20 mg by mouth every morning.          . potassium chloride SA (K-DUR,KLOR-CON) 20 MEQ tablet   Oral   Take 1 tablet (20 mEq total) by mouth 2 (two) times daily.   60 tablet   6   . cyclobenzaprine (FLEXERIL) 5 MG tablet   Oral   Take 5 mg by mouth 2 (two) times daily as needed for muscle spasms.          . hydrocodone-acetaminophen (LORCET-HD) 5-500 MG per capsule   Oral   Take 1 capsule by mouth every 6 (six) hours as needed for pain.            BP 162/110  Pulse 68  Temp(Src) 99.2 F (37.3 C) (Oral)  Resp 19  SpO2 97%  Physical Exam 2000: Physical examination:  Nursing notes reviewed; Vital signs and O2 SAT reviewed;  Constitutional: Well developed, Well nourished, Well hydrated, In no acute distress; Head:  Normocephalic, atraumatic; Eyes: EOMI, PERRL, No scleral icterus; ENMT: Mouth and pharynx normal, Mucous membranes moist; Neck: Supple, Full range of motion, No lymphadenopathy; Cardiovascular: Regular rate and rhythm, No gallop; Respiratory: Breath sounds clear & equal bilaterally, No wheezes.  Speaking full sentences with ease, Normal respiratory effort/excursion; Chest: +right anterior chest wall and parasternal areas tender  to palp, which reproduces pt's pain. No rash, no soft tissue crepitus. Movement normal; Abdomen: Soft, Nontender, Nondistended, Normal bowel sounds; Genitourinary: No CVA tenderness; Extremities: Pulses normal, No tenderness, +1 pedal edema bilat. No calf asymmetry.; Neuro: AA&Ox3, Major CN grossly intact.  Speech clear. No gross focal motor or sensory deficits in extremities.; Skin: Color normal, Warm, Dry.   ED Course  Procedures     MDM  MDM Reviewed: previous chart, nursing note and vitals Reviewed previous: ECG and labs Interpretation: ECG, labs and x-ray    Date: 07/24/2012  Rate: 73  Rhythm: normal sinus rhythm, premature atrial contractions (PAC) and premature ventricular contractions (PVC)  QRS Axis: normal  Intervals: QT prolonged , QTc 519  ST/T Wave abnormalities: nonspecific T wave changes  Conduction Disutrbances:none  Narrative Interpretation:   Old EKG Reviewed: changes noted; QTc prolonged compared to previous EKG dated 06/26/2012, but similar to QTc on EKG dated 02/28/2012; otherwise no significant changes.   Results for orders placed during the hospital encounter of 07/24/12  CBC      Result Value Range  WBC 5.9  4.0 - 10.5 K/uL   RBC 3.96  3.87 - 5.11 MIL/uL   Hemoglobin 11.6 (*) 12.0 - 15.0 g/dL   HCT 40.9 (*) 81.1 - 91.4 %   MCV 85.4  78.0 - 100.0 fL   MCH 29.3  26.0 - 34.0 pg   MCHC 34.3  30.0 - 36.0 g/dL   RDW 78.2  95.6 - 21.3 %   Platelets 209  150 - 400 K/uL  BASIC METABOLIC PANEL      Result Value Range   Sodium 144  135 - 145 mEq/L   Potassium 3.1 (*) 3.5 - 5.1 mEq/L   Chloride 106  96 - 112 mEq/L   CO2 28  19 - 32 mEq/L   Glucose, Bld 133 (*) 70 - 99 mg/dL   BUN 38 (*) 6 - 23 mg/dL   Creatinine, Ser 0.86 (*) 0.50 - 1.10 mg/dL   Calcium 9.5  8.4 - 57.8 mg/dL   GFR calc non Af Amer 21 (*) >90 mL/min   GFR calc Af Amer 24 (*) >90 mL/min  PRO B NATRIURETIC PEPTIDE      Result Value Range   Pro B Natriuretic peptide (BNP) 2173.0 (*) 0 - 125  pg/mL  PROTIME-INR      Result Value Range   Prothrombin Time 14.4  11.6 - 15.2 seconds   INR 1.14  0.00 - 1.49  POCT I-STAT TROPONIN I      Result Value Range   Troponin i, poc 0.02  0.00 - 0.08 ng/mL   Comment 3           POCT I-STAT TROPONIN I      Result Value Range   Troponin i, poc 0.02  0.00 - 0.08 ng/mL   Comment 3            Dg Chest Port 1 View 07/24/2012  *RADIOLOGY REPORT*  Clinical Data: Prior chest pain and shortness of breath  PORTABLE CHEST - 1 VIEW  Comparison: 06/26/2012  Findings: The cardiopericardial silhouette is enlarged. Interstitial markings are diffusely coarsened with chronic features.  There is retrocardiac atelectasis or infiltrate with stable appearance of chronic left-sided pleural thickening.  Loop recorder overlies the left heart. Imaged bony structures of the thorax are intact.  IMPRESSION: Cardiomegaly with underlying chronic interstitial changes and retrocardiac atelectasis or infiltrate.  Stable pleural thickening in the left base.   Original Report Authenticated By: Kennith Center, M.D.    Results for KAILAH, PENNEL (MRN [id number]) as of 07/24/2012 21:12  Ref. Range 06/26/2012 15:30 07/24/2012 18:54  BUN Latest Range: 6-23 mg/dL 26 (H) 38 (H)  Creatinine Latest Range: 0.50-1.10 mg/dL 4.13 (H) 2.44 (H)    Results for KEAGAN, ANTHIS (MRN [id number]) as of 07/24/2012 21:12  Ref. Range 03/14/2011 00:34 03/16/2011 01:07 02/27/2012 15:22 03/02/2012 05:55 07/24/2012 18:54  Pro B Natriuretic peptide (BNP) Latest Range: 0-125 pg/mL 935.0 (H) 767.0 (H) 1334.0 (H) 912.4 (H) 2173.0 (H)    2045:  Pt with elevated BUN/Cr from previous, as well as BNP.  Doubt ACS as cause for chest pain, as symptoms are atypical and pt has had a negative cardiac cath in 02/2012.  Will replete potassium PO. Dx and testing d/w pt.  Questions answered.  Verb understanding, agreeable to eval by [facility] in the ED.  T/C to [facility] Dr. Conley Rolls, case discussed, including:  HPI, pertinent PM/SHx, VS/PE,  dx testing, ED course and treatment:  Agreeable to come to ED for eval.  2240:  [facility] Dr. Conley Rolls has evaluated pt: states she does not need to be admitted tonight, he will d/c pt, have her continue to take her diuretic (only as rx) and f/u with her Cards MD tomorrow.  Pt agreeable with this plan.      Laray Anger, DO 07/27/12 [phone]

## 2012-07-24 NOTE — ED Notes (Signed)
Per pt sts substernal chest pain that started today. sts intermittent sharp stabbing. Pt has a loop recorder. sts she has also had some SOB.

## 2012-07-24 NOTE — ED Notes (Signed)
Dr Nedra Hai at bedside with patient. Ordered to hold IV start and lasix.

## 2012-08-14 ENCOUNTER — Ambulatory Visit: Payer: Medicare Other | Attending: Internal Medicine | Admitting: Physical Therapy

## 2012-09-04 ENCOUNTER — Encounter (HOSPITAL_COMMUNITY): Payer: Self-pay | Admitting: Emergency Medicine

## 2012-09-04 ENCOUNTER — Emergency Department (HOSPITAL_COMMUNITY): Payer: PRIVATE HEALTH INSURANCE

## 2012-09-04 ENCOUNTER — Inpatient Hospital Stay (HOSPITAL_COMMUNITY)
Admission: EM | Admit: 2012-09-04 | Discharge: 2012-09-05 | DRG: 293 | Disposition: A | Discharge status: Home / Self Care | Payer: PRIVATE HEALTH INSURANCE | Attending: Internal Medicine | Admitting: Internal Medicine

## 2012-09-04 DIAGNOSIS — F3289 Other specified depressive episodes: Secondary | ICD-10-CM | POA: Diagnosis present

## 2012-09-04 DIAGNOSIS — I10 Essential (primary) hypertension: Secondary | ICD-10-CM | POA: Diagnosis present

## 2012-09-04 DIAGNOSIS — Z7902 Long term (current) use of antithrombotics/antiplatelets: Secondary | ICD-10-CM

## 2012-09-04 DIAGNOSIS — I5033 Acute on chronic diastolic (congestive) heart failure: Principal | ICD-10-CM | POA: Diagnosis present

## 2012-09-04 DIAGNOSIS — N183 Chronic kidney disease, stage 3 unspecified: Secondary | ICD-10-CM | POA: Diagnosis present

## 2012-09-04 DIAGNOSIS — R0602 Shortness of breath: Secondary | ICD-10-CM

## 2012-09-04 DIAGNOSIS — E1169 Type 2 diabetes mellitus with other specified complication: Secondary | ICD-10-CM

## 2012-09-04 DIAGNOSIS — E119 Type 2 diabetes mellitus without complications: Secondary | ICD-10-CM | POA: Diagnosis present

## 2012-09-04 DIAGNOSIS — I2510 Atherosclerotic heart disease of native coronary artery without angina pectoris: Secondary | ICD-10-CM | POA: Diagnosis present

## 2012-09-04 DIAGNOSIS — I160 Hypertensive urgency: Secondary | ICD-10-CM | POA: Diagnosis present

## 2012-09-04 DIAGNOSIS — I4891 Unspecified atrial fibrillation: Secondary | ICD-10-CM | POA: Diagnosis present

## 2012-09-04 DIAGNOSIS — E669 Obesity, unspecified: Secondary | ICD-10-CM | POA: Diagnosis present

## 2012-09-04 DIAGNOSIS — I129 Hypertensive chronic kidney disease with stage 1 through stage 4 chronic kidney disease, or unspecified chronic kidney disease: Secondary | ICD-10-CM | POA: Diagnosis present

## 2012-09-04 DIAGNOSIS — E039 Hypothyroidism, unspecified: Secondary | ICD-10-CM | POA: Diagnosis present

## 2012-09-04 DIAGNOSIS — I252 Old myocardial infarction: Secondary | ICD-10-CM

## 2012-09-04 DIAGNOSIS — Z91199 Patient's noncompliance with other medical treatment and regimen due to unspecified reason: Secondary | ICD-10-CM

## 2012-09-04 DIAGNOSIS — Z91041 Radiographic dye allergy status: Secondary | ICD-10-CM

## 2012-09-04 DIAGNOSIS — Z9119 Patient's noncompliance with other medical treatment and regimen: Secondary | ICD-10-CM

## 2012-09-04 DIAGNOSIS — I509 Heart failure, unspecified: Secondary | ICD-10-CM | POA: Diagnosis present

## 2012-09-04 DIAGNOSIS — Z9861 Coronary angioplasty status: Secondary | ICD-10-CM

## 2012-09-04 DIAGNOSIS — Z79899 Other long term (current) drug therapy: Secondary | ICD-10-CM

## 2012-09-04 DIAGNOSIS — J45909 Unspecified asthma, uncomplicated: Secondary | ICD-10-CM | POA: Diagnosis present

## 2012-09-04 DIAGNOSIS — Z7982 Long term (current) use of aspirin: Secondary | ICD-10-CM

## 2012-09-04 DIAGNOSIS — Z794 Long term (current) use of insulin: Secondary | ICD-10-CM

## 2012-09-04 DIAGNOSIS — K219 Gastro-esophageal reflux disease without esophagitis: Secondary | ICD-10-CM | POA: Diagnosis present

## 2012-09-04 DIAGNOSIS — Z87891 Personal history of nicotine dependence: Secondary | ICD-10-CM

## 2012-09-04 DIAGNOSIS — Z881 Allergy status to other antibiotic agents status: Secondary | ICD-10-CM

## 2012-09-04 DIAGNOSIS — Z91013 Allergy to seafood: Secondary | ICD-10-CM

## 2012-09-04 DIAGNOSIS — Z6838 Body mass index (BMI) 38.0-38.9, adult: Secondary | ICD-10-CM

## 2012-09-04 DIAGNOSIS — F329 Major depressive disorder, single episode, unspecified: Secondary | ICD-10-CM | POA: Diagnosis present

## 2012-09-04 HISTORY — DX: Other chronic pain: G89.29

## 2012-09-04 HISTORY — DX: Type 2 diabetes mellitus without complications: E11.9

## 2012-09-04 HISTORY — DX: Chronic kidney disease, stage 3 unspecified: N18.30

## 2012-09-04 HISTORY — DX: Low back pain: M54.5

## 2012-09-04 HISTORY — DX: Headache, unspecified: R51.9

## 2012-09-04 HISTORY — DX: Low back pain, unspecified: M54.50

## 2012-09-04 HISTORY — DX: Iron deficiency anemia, unspecified: D50.9

## 2012-09-04 HISTORY — DX: Headache: R51

## 2012-09-04 HISTORY — DX: Shortness of breath: R06.02

## 2012-09-04 HISTORY — DX: Chronic kidney disease, stage 3 (moderate): N18.3

## 2012-09-04 LAB — COMPREHENSIVE METABOLIC PANEL
ALT: 21 U/L (ref 0–35)
AST: 14 U/L (ref 0–37)
Albumin: 3.3 g/dL — ABNORMAL LOW (ref 3.5–5.2)
Alkaline Phosphatase: 115 U/L (ref 39–117)
Potassium: 3.6 mEq/L (ref 3.5–5.1)
Sodium: 143 mEq/L (ref 135–145)
Total Protein: 6.8 g/dL (ref 6.0–8.3)

## 2012-09-04 LAB — CBC WITH DIFFERENTIAL/PLATELET
Basophils Relative: 1 % (ref 0–1)
Eosinophils Absolute: 0.1 10*3/uL (ref 0.0–0.7)
MCH: 29.3 pg (ref 26.0–34.0)
MCHC: 33.1 g/dL (ref 30.0–36.0)
Neutro Abs: 4.6 10*3/uL (ref 1.7–7.7)
Neutrophils Relative %: 64 % (ref 43–77)
Platelets: 217 10*3/uL (ref 150–400)
RBC: 3.86 MIL/uL — ABNORMAL LOW (ref 3.87–5.11)

## 2012-09-04 LAB — PRO B NATRIURETIC PEPTIDE: Pro B Natriuretic peptide (BNP): 8454 pg/mL — ABNORMAL HIGH (ref 0–125)

## 2012-09-04 MED ORDER — ATORVASTATIN CALCIUM 10 MG PO TABS
10.0000 mg | ORAL_TABLET | Freq: Every day | ORAL | Status: DC
  Administered 2012-09-05: 10 mg via ORAL
  Filled 2012-09-04 (×2): qty 1

## 2012-09-04 MED ORDER — CYCLOBENZAPRINE HCL 10 MG PO TABS
5.0000 mg | ORAL_TABLET | Freq: Two times a day (BID) | ORAL | Status: DC | PRN
  Administered 2012-09-05: 5 mg via ORAL
  Filled 2012-09-04: qty 1

## 2012-09-04 MED ORDER — FUROSEMIDE 10 MG/ML IJ SOLN: 40.0000 mg | Freq: Three times a day (TID) | INTRAMUSCULAR | Status: DC

## 2012-09-04 MED ORDER — CLOPIDOGREL BISULFATE 75 MG PO TABS
75.0000 mg | ORAL_TABLET | ORAL | Status: DC
  Administered 2012-09-05: 75 mg via ORAL
  Filled 2012-09-04 (×2): qty 1

## 2012-09-04 MED ORDER — HYDRALAZINE HCL 20 MG/ML IJ SOLN
10.0000 mg | INTRAMUSCULAR | Status: DC | PRN
  Administered 2012-09-04: 10 mg via INTRAVENOUS
  Filled 2012-09-04: qty 1

## 2012-09-04 MED ORDER — ACETAMINOPHEN 325 MG PO TABS
650.0000 mg | ORAL_TABLET | ORAL | Status: DC | PRN
  Administered 2012-09-05: 650 mg via ORAL
  Filled 2012-09-04: qty 2

## 2012-09-04 MED ORDER — IPRATROPIUM-ALBUTEROL 0.5-2.5 (3) MG/3ML IN SOLN: 3.0000 mL | Freq: Three times a day (TID) | RESPIRATORY_TRACT | Status: DC

## 2012-09-04 MED ORDER — SODIUM CHLORIDE 0.9 % IV BOLUS (SEPSIS)
500.0000 mL | INTRAVENOUS | Status: AC
  Administered 2012-09-04: 500 mL via INTRAVENOUS

## 2012-09-04 MED ORDER — FUROSEMIDE 10 MG/ML IJ SOLN
40.0000 mg | Freq: Once | INTRAMUSCULAR | Status: AC
  Administered 2012-09-04: 40 mg via INTRAVENOUS
  Filled 2012-09-04: qty 4

## 2012-09-04 MED ORDER — HEPARIN SODIUM (PORCINE) 5000 UNIT/ML IJ SOLN
5000.0000 [IU] | Freq: Three times a day (TID) | INTRAMUSCULAR | Status: DC
  Administered 2012-09-05 (×3): 5000 [IU] via SUBCUTANEOUS
  Filled 2012-09-04 (×4): qty 1

## 2012-09-04 MED ORDER — SODIUM CHLORIDE 0.9 % IJ SOLN: 3.0000 mL | INTRAMUSCULAR | Status: DC | PRN

## 2012-09-04 MED ORDER — NEBIVOLOL HCL 5 MG PO TABS
5.0000 mg | ORAL_TABLET | Freq: Every day | ORAL | Status: DC
  Administered 2012-09-05 (×2): 5 mg via ORAL
  Filled 2012-09-04 (×3): qty 1

## 2012-09-04 MED ORDER — POLYVINYL ALCOHOL 1.4 % OP SOLN
1.0000 [drp] | Freq: Three times a day (TID) | OPHTHALMIC | Status: DC
  Administered 2012-09-05 (×2): 1 [drp] via OPHTHALMIC
  Filled 2012-09-04: qty 15

## 2012-09-04 MED ORDER — POTASSIUM CHLORIDE CRYS ER 20 MEQ PO TBCR
20.0000 meq | EXTENDED_RELEASE_TABLET | Freq: Two times a day (BID) | ORAL | Status: DC
  Administered 2012-09-04 – 2012-09-05 (×2): 20 meq via ORAL
  Filled 2012-09-04 (×4): qty 1

## 2012-09-04 MED ORDER — ASPIRIN EC 81 MG PO TBEC
81.0000 mg | DELAYED_RELEASE_TABLET | Freq: Every day | ORAL | Status: DC
  Administered 2012-09-05 (×2): 81 mg via ORAL
  Filled 2012-09-04 (×2): qty 1

## 2012-09-04 MED ORDER — KETOROLAC TROMETHAMINE 30 MG/ML IJ SOLN
30.0000 mg | Freq: Once | INTRAMUSCULAR | Status: AC
  Administered 2012-09-04: 30 mg via INTRAVENOUS
  Filled 2012-09-04: qty 1

## 2012-09-04 MED ORDER — MOMETASONE FURO-FORMOTEROL FUM 100-5 MCG/ACT IN AERO
2.0000 | INHALATION_SPRAY | Freq: Two times a day (BID) | RESPIRATORY_TRACT | Status: DC
  Administered 2012-09-05: 2 via RESPIRATORY_TRACT
  Filled 2012-09-04: qty 8.8

## 2012-09-04 MED ORDER — IPRATROPIUM BROMIDE 0.02 % IN SOLN
0.5000 mg | Freq: Three times a day (TID) | RESPIRATORY_TRACT | Status: DC
  Administered 2012-09-04: 0.5 mg via RESPIRATORY_TRACT
  Filled 2012-09-04: qty 2.5

## 2012-09-04 MED ORDER — HYDRALAZINE HCL 50 MG PO TABS
50.0000 mg | ORAL_TABLET | Freq: Three times a day (TID) | ORAL | Status: DC
  Administered 2012-09-04 – 2012-09-05 (×3): 50 mg via ORAL
  Filled 2012-09-04 (×6): qty 1

## 2012-09-04 MED ORDER — INSULIN GLARGINE 100 UNIT/ML ~~LOC~~ SOLN
50.0000 [IU] | Freq: Every day | SUBCUTANEOUS | Status: DC
  Administered 2012-09-05: 50 [IU] via SUBCUTANEOUS
  Filled 2012-09-04 (×2): qty 0.5

## 2012-09-04 MED ORDER — SODIUM CHLORIDE 0.9 % IV SOLN: 250.0000 mL | INTRAVENOUS | Status: DC | PRN

## 2012-09-04 MED ORDER — PANTOPRAZOLE SODIUM 40 MG PO TBEC
40.0000 mg | DELAYED_RELEASE_TABLET | Freq: Every day | ORAL | Status: DC
  Administered 2012-09-05 (×2): 40 mg via ORAL
  Filled 2012-09-04 (×2): qty 1

## 2012-09-04 MED ORDER — FUROSEMIDE 10 MG/ML IJ SOLN
40.0000 mg | Freq: Two times a day (BID) | INTRAMUSCULAR | Status: DC
  Administered 2012-09-05: 40 mg via INTRAVENOUS
  Filled 2012-09-04 (×3): qty 4

## 2012-09-04 MED ORDER — BRIMONIDINE TARTRATE 0.2 % OP SOLN
1.0000 [drp] | Freq: Two times a day (BID) | OPHTHALMIC | Status: DC
  Administered 2012-09-05 (×2): 1 [drp] via OPHTHALMIC
  Filled 2012-09-04 (×3): qty 5

## 2012-09-04 MED ORDER — BIMATOPROST 0.01 % OP SOLN
1.0000 [drp] | Freq: Every day | OPHTHALMIC | Status: DC
  Administered 2012-09-05: 1 [drp] via OPHTHALMIC
  Filled 2012-09-04: qty 2.5

## 2012-09-04 MED ORDER — METOCLOPRAMIDE HCL 5 MG/ML IJ SOLN
10.0000 mg | Freq: Once | INTRAMUSCULAR | Status: AC
  Administered 2012-09-04: 10 mg via INTRAVENOUS
  Filled 2012-09-04: qty 2

## 2012-09-04 MED ORDER — NITROGLYCERIN 0.4 MG SL SUBL: 0.4000 mg | SUBLINGUAL_TABLET | SUBLINGUAL | Status: DC | PRN

## 2012-09-04 MED ORDER — FERROUS SULFATE 325 (65 FE) MG PO TABS
325.0000 mg | ORAL_TABLET | Freq: Two times a day (BID) | ORAL | Status: DC
  Administered 2012-09-05 (×2): 325 mg via ORAL
  Filled 2012-09-04 (×3): qty 1

## 2012-09-04 MED ORDER — SODIUM CHLORIDE 0.9 % IJ SOLN
3.0000 mL | Freq: Two times a day (BID) | INTRAMUSCULAR | Status: DC
  Administered 2012-09-05 (×2): 3 mL via INTRAVENOUS

## 2012-09-04 MED ORDER — ALBUTEROL SULFATE (5 MG/ML) 0.5% IN NEBU
2.5000 mg | INHALATION_SOLUTION | Freq: Three times a day (TID) | RESPIRATORY_TRACT | Status: DC
  Administered 2012-09-04: 2.5 mg via RESPIRATORY_TRACT
  Filled 2012-09-04: qty 0.5

## 2012-09-04 MED ORDER — ONDANSETRON HCL 4 MG/2ML IJ SOLN: 4.0000 mg | Freq: Four times a day (QID) | INTRAMUSCULAR | Status: DC | PRN

## 2012-09-04 MED ORDER — ALBUTEROL SULFATE HFA 108 (90 BASE) MCG/ACT IN AERS: 2.0000 | INHALATION_SPRAY | Freq: Four times a day (QID) | RESPIRATORY_TRACT | Status: DC | PRN

## 2012-09-04 MED ORDER — ISOSORBIDE MONONITRATE ER 60 MG PO TB24
60.0000 mg | ORAL_TABLET | Freq: Every day | ORAL | Status: DC
  Administered 2012-09-05 (×2): 60 mg via ORAL
  Filled 2012-09-04 (×3): qty 1

## 2012-09-04 MED ORDER — INSULIN ASPART 100 UNIT/ML ~~LOC~~ SOLN
0.0000 [IU] | Freq: Three times a day (TID) | SUBCUTANEOUS | Status: DC
  Administered 2012-09-05: 3 [IU] via SUBCUTANEOUS

## 2012-09-04 MED ORDER — LEVOTHYROXINE SODIUM 100 MCG PO TABS
100.0000 ug | ORAL_TABLET | Freq: Every day | ORAL | Status: DC
  Administered 2012-09-05: 100 ug via ORAL
  Filled 2012-09-04 (×2): qty 1

## 2012-09-04 NOTE — H&P (Addendum)
[facility] History and Physical  ARLONE LENHARDT ZOX:[id number] DOB: Jan 29, 1946 DOA: 09/04/2012  Referring physician: ED PCP: Katy Apo, MD  Specialists: [facility]  Chief Complaint: Headache, SOB  HPI: Wanda [name] is a 67 y.o. female who presents with c/o headache for the past 2 days.  She has tried taking her BP meds and tylenol without relief.  She states she does not have a PMH of migraines, she thinks her headache may be due to her blood pressure being so high.  She also notes that she has had mild increase in SOB as well during this time period.  In the ED, CXR does confirm mild chf, her headache is improved by controlling her BP with lasix (SBP down to 180).  Breathing is improved with lasix, hospitalist asked to admit.  Review of Systems: 12 systems reviewed and otherwise negative.  Past Medical History  Diagnosis Date  . Palpitations   . History of angina   . HTN (hypertension)   . DM (diabetes mellitus)   . CAD (coronary artery disease)   . Bigeminy   . Obesity   . Renal failure   . Spinal headache   . PONV (postoperative nausea and vomiting)   . Adverse effect of anesthesia 05/2009    MI during cardiac  stent placement  . Angina   . Myocardial infarction 05/2009  . Heart murmur   . Asthma   . Shortness of breath     "w/exertion"  . Hypothyroidism     "started med 01/2011"  . Blood transfusion   . Anemia 02/14/11     "I have taken shots for it; I take iron pills now"  . CHF (congestive heart failure)   . Recurrent upper respiratory infection (URI)     "colds every year"  . Pneumonia     last time "winter 2011"  . Hiatal hernia   . Depression      "lost son 03/2010"  . GERD (gastroesophageal reflux disease)   . Arthritis   . 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 Atrial fibrillation    Past Surgical History  Procedure Laterality Date  . Cardiac stents.  2010; 05/2009  . Lt vats jan 2012    . Tubal ligation    .  Cardiac catheterization      last stent 05/2009; "had MI during this"  . Coronary angioplasty with stent placement  05/2009  . Coronary angioplasty with stent placement  2010  . Abdominal hysterectomy  ~ 1986  . Diagnostic laparoscopy  post 1986    ovaries removed   . Eye surgery  ~ 2009    laser tx   . Fracture surgery  2002    right ankle  . Dilation and curettage of uterus    . Loop recorder implant  03/01/12    [employer]   Social History:  reports that she quit smoking about 14 years ago. She has never used smokeless tobacco. She reports that she does not drink alcohol or use illicit drugs.   Allergies  Allergen Reactions  . Blue Dyes (Parenteral) Swelling       . Other     Cardiologist told patient not to take anything that affects "QT"  . Shrimp (Shellfish Allergy)     ONLY had a reaction once (face swelled up)---has been able to eat shrimp and shellfish every other time   . Erythromycin Palpitations    Family History  Problem Relation Age of Onset  .  Cancer       Prior to Admission medications   Medication Sig Start Date End Date Taking? Authorizing Provider  albuterol (PROVENTIL HFA;VENTOLIN HFA) 108 (90 BASE) MCG/ACT inhaler Inhale 2 puffs into the lungs every 6 (six) hours as needed for wheezing or shortness of breath.    Yes Historical Provider, MD  aspirin EC 81 MG tablet Take 81 mg by mouth daily.   Yes Historical Provider, MD  atorvastatin (LIPITOR) 10 MG tablet Take 1 tablet (10 mg total) by mouth daily at 6 PM. 03/03/12  Yes Nada Boozer, NP  Bimatoprost (LUMIGAN) 0.01 % SOLN Place 1 drop into both eyes at bedtime.    Yes Historical Provider, MD  brimonidine (ALPHAGAN) 0.15 % ophthalmic solution Place 1 drop into both eyes 2 (two) times daily.    Yes Historical Provider, MD  Carboxymeth-Glycerin-Polysorb (REFRESH OPTIVE ADVANCED) 0.5-1-0.5 % SOLN Place 1 drop into both eyes 3 (three) times daily.   Yes Historical Provider, MD  clopidogrel (PLAVIX) 75 MG tablet  Take 75 mg by mouth every morning.    Yes Historical Provider, MD  cyclobenzaprine (FLEXERIL) 5 MG tablet Take 5 mg by mouth 2 (two) times daily as needed for muscle spasms.    Yes Historical Provider, MD  ferrous sulfate 325 (65 FE) MG tablet Take 325 mg by mouth 2 (two) times daily.     Yes Historical Provider, MD  Fluticasone-Salmeterol (ADVAIR) 100-50 MCG/DOSE AEPB Inhale 1 puff into the lungs every 12 (twelve) hours.   Yes Historical Provider, MD  furosemide (LASIX) 40 MG tablet Take 1 tablet (40 mg total) by mouth daily. 03/03/12  Yes Nada Boozer, NP  hydrALAZINE (APRESOLINE) 50 MG tablet Take 1 tablet (50 mg total) by mouth every 8 (eight) hours. 03/03/12  Yes Nada Boozer, NP  insulin glargine (LANTUS) 100 UNIT/ML injection Inject 50 Units into the skin daily after breakfast.    Yes Historical Provider, MD  insulin lispro (HUMALOG) 100 UNIT/ML injection Inject 2-14 Units into the skin 3 (three) times daily between meals as needed. sliding scale-150-175=2 units, 176-200=4 units, 201-225=6 units, 226-250=8 units, 251-300=10 units, 301-350=12 units & >350=14 units & contact doctor   Yes Historical Provider, MD  ipratropium-albuterol (DUONEB) 0.5-2.5 (3) MG/3ML SOLN Take 3 mLs by nebulization 3 (three) times daily.    Yes Historical Provider, MD  isosorbide mononitrate (IMDUR) 60 MG 24 hr tablet Take 1 tablet (60 mg total) by mouth daily. 03/03/12  Yes Nada Boozer, NP  levothyroxine (SYNTHROID, LEVOTHROID) 100 MCG tablet Take 100 mcg by mouth daily before breakfast.   Yes Historical Provider, MD  metolazone (ZAROXOLYN) 5 MG tablet Take 5 mg by mouth daily as needed. Only takes for a weight gain of 5lbs   Yes Historical Provider, MD  nebivolol (BYSTOLIC) 5 MG tablet Take 1 tablet (5 mg total) by mouth daily. 03/03/12  Yes Nada Boozer, NP  nitroGLYCERIN (NITROSTAT) 0.4 MG SL tablet Place 0.4 mg under the tongue every 5 (five) minutes as needed for chest pain.    Yes Historical Provider, MD   omeprazole (PRILOSEC) 20 MG capsule Take 20 mg by mouth every morning.    Yes Historical Provider, MD  potassium chloride SA (K-DUR,KLOR-CON) 20 MEQ tablet Take 1 tablet (20 mEq total) by mouth 2 (two) times daily. 03/03/12  Yes Nada Boozer, NP   Physical Exam: Filed Vitals:   09/04/12 2045 09/04/12 2100 09/04/12 2130 09/04/12 2145  BP: 197/80 199/76 180/71 194/71  Pulse: 62 61 61 58  Temp:  TempSrc:      Resp:      SpO2: 100% 99% 100% 99%    General:  NAD, resting comfortably in bed Eyes: PEERLA EOMI ENT: mucous membranes moist Neck: supple w/o JVD Cardiovascular: RRR w/o MRG Respiratory: CTA B Abdomen: soft, nt, nd, bs+ Skin: no rash nor lesion Musculoskeletal: MAE, full ROM all 4 extremities Psychiatric: normal tone and affect Neurologic: AAOx3, grossly non-focal  Labs on Admission:  Basic Metabolic Panel:  Recent Labs Lab 09/04/12 1447  NA 143  K 3.6  CL 109  CO2 22  GLUCOSE 137*  BUN 16  CREATININE 1.44*  CALCIUM 9.4   Liver Function Tests:  Recent Labs Lab 09/04/12 1447  AST 14  ALT 21  ALKPHOS 115  BILITOT 0.5  PROT 6.8  ALBUMIN 3.3*   No results found for this basename: LIPASE, AMYLASE,  in the last 168 hours No results found for this basename: AMMONIA,  in the last 168 hours CBC:  Recent Labs Lab 09/04/12 1447  WBC 7.1  NEUTROABS 4.6  HGB 11.3*  HCT 34.1*  MCV 88.3  PLT 217   Cardiac Enzymes: No results found for this basename: CKTOTAL, CKMB, CKMBINDEX, TROPONINI,  in the last 168 hours  BNP (last 3 results)  Recent Labs  03/02/12 0555 07/24/12 1854 09/04/12 1447  PROBNP 912.4* 2173.0* 8454.0*   CBG: No results found for this basename: GLUCAP,  in the last 168 hours  Radiological Exams on Admission: Dg Chest 2 View  09/04/2012   *RADIOLOGY REPORT*  Clinical Data: Cough.  CHEST - 2 VIEW  Comparison: Chest x-ray 07/24/2012.  Findings: There is cephalization of the pulmonary vasculature and slight indistinctness of the  interstitial markings suggestive of mild pulmonary edema.  Mild cardiomegaly.  Possible trace bilateral pleural effusions.  Postoperative changes in the left perihilar area likely related to prior left upper lobectomy or left upper lobe wedge resection.  Chronic elevation of left hemidiaphragm is unchanged.  Atherosclerosis in the thoracic aorta.  Electronic device projecting over the heart is compatible with a loop recorder.  IMPRESSION: 1.  Findings, as above, concerning for mild congestive heart failure. 2.  Atherosclerosis. 3.  Postoperative changes and support apparatus, as above.   Original Report Authenticated By: Trudie Reed, M.D.   Ct Head Wo Contrast  09/04/2012   *RADIOLOGY REPORT*  Clinical Data: Headache for 2 days.  CT HEAD WITHOUT CONTRAST  Technique:  Contiguous axial images were obtained from the base of the skull through the vertex without contrast.  Comparison: 03/10/2010  Findings: Bone windows demonstrate clear paranasal sinuses and mastoid air cells.  Soft tissue windows demonstrate mild to moderate low density in the periventricular white matter likely related to small vessel disease.This is new since 03/10/2010.  Cerebellar atrophy which is mildly advanced.  Subtle hypoattenuation in the left internal capsule on image 11/series 2.  This is indeterminate.  Otherwise, no  mass lesion, hemorrhage, hydrocephalus, acute infarct, intra- axial, or extra-axial fluid collection.  IMPRESSION:  1. No acute process or explanation for headache. 2.  New  mild to moderate small vessel ischemic change. 3.  Subtle hypoattenuation the left internal capsule.  Cannot exclude age indeterminate lacunar infarct.   Original Report Authenticated By: Jeronimo Greaves, M.D.    EKG: Independently reviewed.  Assessment/Plan Principal Problem:   Acute on chronic diastolic CHF (congestive heart failure) Active Problems:   Hypertension associated with diabetes   Diabetes mellitus   Chronic renal disease, stage  III  1. Acute on chronic diastolic CHF - putting patient on lasix 40mg  IV BID dont want to up the dose too much has she has shown that her renal disease will worsen with higher doses of lasix in the past.  HTN also appears to be improving slowly with Lasix, SBP now down to 180, will put patient on home BP meds as well (has been concerns of compliance in this patient in past apparently which is why she isnt on coumadin for her A.Fib). 2. DM2 - continue home lantus and adding SSI med dose 3. HTN - appears to be in mild HTN urgency at presentation with SBP 211, has come down to 180 at this time after lasix, plan to continue home meds as well as lasix, monitor, adding additional PRN hydralazine (already on 50 Q8H with home meds), avoiding additional CCB or B blocker as these have caused torsades in the recent past. 4. CKD stage 3 - chronic and stable, monitor renal function with lasix.  Curb sided Dr. Roseanne Reno regarding the lacunar infarct seen on CT scan and he states that it is likely old.  Code Status: Full Code (must indicate code status--if unknown or must be presumed, indicate so) Family Communication: No family in room (indicate person spoken with, if applicable, with phone number if by telephone) Disposition Plan: Admit to obs (indicate anticipated LOS)  Time spent: 70 min  GARDNER, JARED M. [facility] Pager [phone]  If 7PM-7AM, please contact night-coverage [email or web address] Password [facility] 09/04/2012, 10:18 PM

## 2012-09-04 NOTE — ED Provider Notes (Signed)
Medical screening examination/treatment/procedure(s) were performed by non-physician practitioner and as supervising physician I was immediately available for consultation/collaboration.  Toy Baker, MD 09/04/12 [phone]

## 2012-09-04 NOTE — ED Notes (Signed)
Headache x 2 days took her bp meds and 2 tylenol nothing has helped

## 2012-09-04 NOTE — ED Provider Notes (Signed)
History     CSN: [id number]  Arrival date & time 09/04/12  1436   First MD Initiated Contact with Patient 09/04/12 1507      Chief Complaint  Patient presents with  . Headache    (Consider location/radiation/quality/duration/timing/severity/associated sxs/prior treatment) HPI Patient presents emergency department with headache that started 2 days, ago, with a gradual onset.  Patient also, states she's had some cough, and shortness of breath over the last 2 days.  Patient, states, that she did not take anything for her cough and shortness of breath, but she did take Tylenol for her headache. patient states, that she was recently admitted for CHF.patient denies weakness, numbness, blurred vision,chest pain syncope nausea, vomiting, diarrhea, abdominal pain, back pain, or neck pain.  Patient, states nothing seems to make her headache, better or worse. Past Medical History  Diagnosis Date  . Palpitations   . History of angina   . HTN (hypertension)   . DM (diabetes mellitus)   . CAD (coronary artery disease)   . Bigeminy   . Obesity   . Renal failure   . Spinal headache   . PONV (postoperative nausea and vomiting)   . Adverse effect of anesthesia 05/2009    MI during cardiac  stent placement  . Angina   . Myocardial infarction 05/2009  . Heart murmur   . Asthma   . Shortness of breath     "w/exertion"  . Hypothyroidism     "started med 01/2011"  . Blood transfusion   . Anemia 02/14/11     "I have taken shots for it; I take iron pills now"  . CHF (congestive heart failure)   . Recurrent upper respiratory infection (URI)     "colds every year"  . Pneumonia     last time "winter 2011"  . Hiatal hernia   . Depression      "lost son 03/2010"  . GERD (gastroesophageal reflux disease)   . Arthritis   . 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 Atrial fibrillation     Past Surgical History  Procedure Laterality Date  . Cardiac stents.  2010; 05/2009  .  Lt vats jan 2012    . Tubal ligation    . Cardiac catheterization      last stent 05/2009; "had MI during this"  . Coronary angioplasty with stent placement  05/2009  . Coronary angioplasty with stent placement  2010  . Abdominal hysterectomy  ~ 1986  . Diagnostic laparoscopy  post 1986    ovaries removed   . Eye surgery  ~ 2009    laser tx   . Fracture surgery  2002    right ankle  . Dilation and curettage of uterus    . Loop recorder implant  03/01/12    [employer]    Family History  Problem Relation Age of Onset  . Cancer      History  Substance Use Topics  . Smoking status: Former Smoker    Quit date: 03/10/1998  . Smokeless tobacco: Never Used  . Alcohol Use: No     Comment: "quit early 1990's"    OB History   Grav Para Term Preterm Abortions TAB SAB Ect Mult Living                  Review of Systems All other systems negative except as documented in the HPI. All pertinent positives and negatives as reviewed in the HPI. Allergies  Blue dyes (parenteral);  Other; Shrimp; and Erythromycin  Home Medications   Current Outpatient Rx  Name  Route  Sig  Dispense  Refill  . albuterol (PROVENTIL HFA;VENTOLIN HFA) 108 (90 BASE) MCG/ACT inhaler   Inhalation   Inhale 2 puffs into the lungs every 6 (six) hours as needed for wheezing or shortness of breath.          Marland Kitchen aspirin EC 81 MG tablet   Oral   Take 81 mg by mouth daily.         Marland Kitchen atorvastatin (LIPITOR) 10 MG tablet   Oral   Take 1 tablet (10 mg total) by mouth daily at 6 PM.   30 tablet   6   . Bimatoprost (LUMIGAN) 0.01 % SOLN   Both Eyes   Place 1 drop into both eyes at bedtime.          . brimonidine (ALPHAGAN) 0.15 % ophthalmic solution   Both Eyes   Place 1 drop into both eyes 2 (two) times daily.          . Carboxymeth-Glycerin-Polysorb (REFRESH OPTIVE ADVANCED) 0.5-1-0.5 % SOLN   Both Eyes   Place 1 drop into both eyes 3 (three) times daily.         . clopidogrel (PLAVIX) 75 MG  tablet   Oral   Take 75 mg by mouth every morning.          . cyclobenzaprine (FLEXERIL) 5 MG tablet   Oral   Take 5 mg by mouth 2 (two) times daily as needed for muscle spasms.          . ferrous sulfate 325 (65 FE) MG tablet   Oral   Take 325 mg by mouth 2 (two) times daily.           . Fluticasone-Salmeterol (ADVAIR) 100-50 MCG/DOSE AEPB   Inhalation   Inhale 1 puff into the lungs every 12 (twelve) hours.         . furosemide (LASIX) 40 MG tablet   Oral   Take 1 tablet (40 mg total) by mouth daily.   30 tablet   6   . hydrALAZINE (APRESOLINE) 50 MG tablet   Oral   Take 1 tablet (50 mg total) by mouth every 8 (eight) hours.   90 tablet   6   . insulin glargine (LANTUS) 100 UNIT/ML injection   Subcutaneous   Inject 50 Units into the skin daily after breakfast.          . insulin lispro (HUMALOG) 100 UNIT/ML injection   Subcutaneous   Inject 2-14 Units into the skin 3 (three) times daily between meals as needed. sliding scale-150-175=2 units, 176-200=4 units, 201-225=6 units, 226-250=8 units, 251-300=10 units, 301-350=12 units & >350=14 units & contact doctor         . ipratropium-albuterol (DUONEB) 0.5-2.5 (3) MG/3ML SOLN   Nebulization   Take 3 mLs by nebulization 3 (three) times daily.          . isosorbide mononitrate (IMDUR) 60 MG 24 hr tablet   Oral   Take 1 tablet (60 mg total) by mouth daily.   30 tablet   11   . levothyroxine (SYNTHROID, LEVOTHROID) 100 MCG tablet   Oral   Take 100 mcg by mouth daily before breakfast.         . metolazone (ZAROXOLYN) 5 MG tablet   Oral   Take 5 mg by mouth daily as needed. Only takes for a weight gain of 5lbs         .  nebivolol (BYSTOLIC) 5 MG tablet   Oral   Take 1 tablet (5 mg total) by mouth daily.   30 tablet   6   . nitroGLYCERIN (NITROSTAT) 0.4 MG SL tablet   Sublingual   Place 0.4 mg under the tongue every 5 (five) minutes as needed for chest pain.          Marland Kitchen omeprazole (PRILOSEC) 20  MG capsule   Oral   Take 20 mg by mouth every morning.          . potassium chloride SA (K-DUR,KLOR-CON) 20 MEQ tablet   Oral   Take 1 tablet (20 mEq total) by mouth 2 (two) times daily.   60 tablet   6     BP 191/71  Pulse 55  Temp(Src) 97.8 F (36.6 C) (Oral)  Resp 24  SpO2 100%  Physical Exam  Nursing note and vitals reviewed. Constitutional: She is oriented to person, place, and time. She appears well-developed and well-nourished. No distress.  HENT:  Head: Normocephalic and atraumatic.  Mouth/Throat: Oropharynx is clear and moist.  Eyes: Pupils are equal, round, and reactive to light.  Neck: Normal range of motion. Neck supple.  Cardiovascular: Normal rate, regular rhythm and normal heart sounds.  Exam reveals no gallop and no friction rub.   No murmur heard. Pulmonary/Chest: Effort normal and breath sounds normal. No respiratory distress. She has no rales.  Neurological: She is alert and oriented to person, place, and time. She has normal strength. No sensory deficit. She exhibits normal muscle tone. She displays a negative Romberg sign. Coordination and gait normal. GCS eye subscore is 4. GCS verbal subscore is 5. GCS motor subscore is 6.  Skin: Skin is warm and dry. No rash noted.  Psychiatric: She has a normal mood and affect. Her behavior is normal. Judgment and thought content normal.    ED Course  Procedures (including critical care time)  Labs Reviewed  CBC WITH DIFFERENTIAL - Abnormal; Notable for the following:    RBC 3.86 (*)    Hemoglobin 11.3 (*)    HCT 34.1 (*)    All other components within normal limits  COMPREHENSIVE METABOLIC PANEL - Abnormal; Notable for the following:    Glucose, Bld 137 (*)    Creatinine, Ser 1.44 (*)    Albumin 3.3 (*)    GFR calc non Af Amer 37 (*)    GFR calc Af Amer 43 (*)    All other components within normal limits  PRO B NATRIURETIC PEPTIDE - Abnormal; Notable for the following:    Pro B Natriuretic peptide (BNP)  8454.0 (*)    All other components within normal limits  TROPONIN I   Dg Chest 2 View  09/04/2012   *RADIOLOGY REPORT*  Clinical Data: Cough.  CHEST - 2 VIEW  Comparison: Chest x-ray 07/24/2012.  Findings: There is cephalization of the pulmonary vasculature and slight indistinctness of the interstitial markings suggestive of mild pulmonary edema.  Mild cardiomegaly.  Possible trace bilateral pleural effusions.  Postoperative changes in the left perihilar area likely related to prior left upper lobectomy or left upper lobe wedge resection.  Chronic elevation of left hemidiaphragm is unchanged.  Atherosclerosis in the thoracic aorta.  Electronic device projecting over the heart is compatible with a loop recorder.  IMPRESSION: 1.  Findings, as above, concerning for mild congestive heart failure. 2.  Atherosclerosis. 3.  Postoperative changes and support apparatus, as above.   Original Report Authenticated By: Trudie Reed, M.D.  Ct Head Wo Contrast  09/04/2012   *RADIOLOGY REPORT*  Clinical Data: Headache for 2 days.  CT HEAD WITHOUT CONTRAST  Technique:  Contiguous axial images were obtained from the base of the skull through the vertex without contrast.  Comparison: 03/10/2010  Findings: Bone windows demonstrate clear paranasal sinuses and mastoid air cells.  Soft tissue windows demonstrate mild to moderate low density in the periventricular white matter likely related to small vessel disease.This is new since 03/10/2010.  Cerebellar atrophy which is mildly advanced.  Subtle hypoattenuation in the left internal capsule on image 11/series 2.  This is indeterminate.  Otherwise, no  mass lesion, hemorrhage, hydrocephalus, acute infarct, intra- axial, or extra-axial fluid collection.  IMPRESSION:  1. No acute process or explanation for headache. 2.  New  mild to moderate small vessel ischemic change. 3.  Subtle hypoattenuation the left internal capsule.  Cannot exclude age indeterminate lacunar infarct.    Original Report Authenticated By: Jeronimo Greaves, M.D.     1. SOB (shortness of breath)   2. Acute on chronic diastolic CHF (congestive heart failure)   3. Diabetes mellitus   4. Hypertension associated with diabetes   5. Chronic renal disease, stage III   6. Hypertensive urgency    Patient has no focal neurological deficits, but will need to be admitted for his breath, and CHF.  Patient advised of the plan.  [facility], was called to admit the patient.   MDM  MDM Reviewed: vitals, previous chart and nursing note Reviewed previous: labs, ECG, x-ray and CT scan Interpretation: labs, ECG, x-ray and CT scan Consults: admitting MD           Carlyle Dolly, PA-C 09/04/12 2328

## 2012-09-05 ENCOUNTER — Encounter (HOSPITAL_COMMUNITY): Payer: Self-pay | Admitting: General Practice

## 2012-09-05 LAB — GLUCOSE, CAPILLARY
Glucose-Capillary: 110 mg/dL — ABNORMAL HIGH (ref 70–99)
Glucose-Capillary: 180 mg/dL — ABNORMAL HIGH (ref 70–99)
Glucose-Capillary: 136 mg/dL — ABNORMAL HIGH (ref 70–99)

## 2012-09-05 LAB — BASIC METABOLIC PANEL
CO2: 17 mEq/L — ABNORMAL LOW (ref 19–32)
Chloride: 110 mEq/L (ref 96–112)
Creatinine, Ser: 1.46 mg/dL — ABNORMAL HIGH (ref 0.50–1.10)
Glucose, Bld: 113 mg/dL — ABNORMAL HIGH (ref 70–99)
Sodium: 140 mEq/L (ref 135–145)

## 2012-09-05 LAB — TROPONIN I: Troponin I: <0.3 ng/mL (ref <0.30)

## 2012-09-05 MED ORDER — FUROSEMIDE 10 MG/ML IJ SOLN
40.0000 mg | Freq: Once | INTRAMUSCULAR | Status: AC
  Administered 2012-09-05: 40 mg via INTRAVENOUS

## 2012-09-05 MED ORDER — FUROSEMIDE 10 MG/ML IJ SOLN
80.0000 mg | Freq: Two times a day (BID) | INTRAMUSCULAR | Status: DC
  Filled 2012-09-05: qty 4

## 2012-09-05 NOTE — Discharge Summary (Signed)
Physician Discharge Summary  Patient ID: Wanda [name] MRN: [id number] DOB/AGE: Sep 03, 1945 67 y.o.  Admit date: 09/04/2012 Discharge date: 09/05/2012  Admission Diagnoses:  Discharge Diagnoses:  Principal Problem:   Acute on chronic diastolic CHF (congestive heart failure) Active Problems:   Hypertension associated with diabetes   Diabetes mellitus   Chronic renal disease, stage III   Hypertensive urgency   Discharged Condition: stable  Hospital Course:  Patient presented to the emergency room with complaint of headache, shortness of breath. Evaluation in the emergency room revealed elevated blood pressure. CT of the head without any acute abnormality, elevated BNP and chest x-ray suggested vascular congestion. Admission was deemed necessary for further evaluation and treatment. Please see dictated H&P for further details. Patient was admitted to a medical floor bed, she was given IV Lasix, she had serial cardiac enzymes which were negative for ischemia. Her EKG without any acute abnormality. Patient did have dietary indiscretions over the weekend. She states she was taking her Lasix as prescribed however she did not take her Zaroxolyn which has been prescribed for when necessary edema. She does believe her weight was increased. She believes her headache was related to her blood pressure. We reviewed in detail the need to avoid foods high in sodium and to take medication as prescribed. She states she was without her Zaroxolyn. This morning patient is alert, oriented, she states her leg edema is less. I discussed given her a higher dose of IV Lasix today and to begin plans for discharge. Her creatinine is less than typical suggesting she may have not been taking her Lasix as prescribed, her baseline creatinine is 2. Patient has a known history of coronary artery disease, LV dysfunction, diastolic dysfunction, diabetes, chronic kidney disease. Again her EKG is without acute changes, cardiac  enzymes are negative for ischemia. Her history suggests dietary noncompliance and medication noncompliance. Patient is felt to be stable for discharge to home  Consults:    Significant Diagnostic Studies:Dg Chest 2 View  09/04/2012   *RADIOLOGY REPORT*  Clinical Data: Cough.  CHEST - 2 VIEW  Comparison: Chest x-ray 07/24/2012.  Findings: There is cephalization of the pulmonary vasculature and slight indistinctness of the interstitial markings suggestive of mild pulmonary edema.  Mild cardiomegaly.  Possible trace bilateral pleural effusions.  Postoperative changes in the left perihilar area likely related to prior left upper lobectomy or left upper lobe wedge resection.  Chronic elevation of left hemidiaphragm is unchanged.  Atherosclerosis in the thoracic aorta.  Electronic device projecting over the heart is compatible with a loop recorder.  IMPRESSION: 1.  Findings, as above, concerning for mild congestive heart failure. 2.  Atherosclerosis. 3.  Postoperative changes and support apparatus, as above.   Original Report Authenticated By: Trudie Reed, M.D.   Ct Head Wo Contrast  09/04/2012   *RADIOLOGY REPORT*  Clinical Data: Headache for 2 days.  CT HEAD WITHOUT CONTRAST  Technique:  Contiguous axial images were obtained from the base of the skull through the vertex without contrast.  Comparison: 03/10/2010  Findings: Bone windows demonstrate clear paranasal sinuses and mastoid air cells.  Soft tissue windows demonstrate mild to moderate low density in the periventricular white matter likely related to small vessel disease.This is new since 03/10/2010.  Cerebellar atrophy which is mildly advanced.  Subtle hypoattenuation in the left internal capsule on image 11/series 2.  This is indeterminate.  Otherwise, no  mass lesion, hemorrhage, hydrocephalus, acute infarct, intra- axial, or extra-axial fluid collection.  IMPRESSION:  1.  No acute process or explanation for headache. 2.  New  mild to moderate small  vessel ischemic change. 3.  Subtle hypoattenuation the left internal capsule.  Cannot exclude age indeterminate lacunar infarct.   Original Report Authenticated By: Jeronimo Greaves, M.D.      Discharge Exam: Blood pressure 177/82, pulse 62, temperature 97.7 F (36.5 C), temperature source Oral, resp. rate 18, height 5\' 1"  (1.549 m), weight 91.717 kg (202 lb 3.2 oz), SpO2 97.00%. General appearance: alert and cooperative Resp: clear to auscultation bilaterally Cardio: regular rate and rhythm Extremities: trace edema  Disposition: 01-Home or Self Care     Medication List    TAKE these medications       albuterol 108 (90 BASE) MCG/ACT inhaler  Commonly known as:  PROVENTIL HFA;VENTOLIN HFA  Inhale 2 puffs into the lungs every 6 (six) hours as needed for wheezing or shortness of breath.     aspirin EC 81 MG tablet  Take 81 mg by mouth daily.     atorvastatin 10 MG tablet  Commonly known as:  LIPITOR  Take 1 tablet (10 mg total) by mouth daily at 6 PM.     brimonidine 0.15 % ophthalmic solution  Commonly known as:  ALPHAGAN  Place 1 drop into both eyes 2 (two) times daily.     clopidogrel 75 MG tablet  Commonly known as:  PLAVIX  Take 75 mg by mouth every morning.     cyclobenzaprine 5 MG tablet  Commonly known as:  FLEXERIL  Take 5 mg by mouth 2 (two) times daily as needed for muscle spasms.     ferrous sulfate 325 (65 FE) MG tablet  Take 325 mg by mouth 2 (two) times daily.     Fluticasone-Salmeterol 100-50 MCG/DOSE Aepb  Commonly known as:  ADVAIR  Inhale 1 puff into the lungs every 12 (twelve) hours.     furosemide 40 MG tablet  Commonly known as:  LASIX  Take 1 tablet (40 mg total) by mouth daily.     hydrALAZINE 50 MG tablet  Commonly known as:  APRESOLINE  Take 1 tablet (50 mg total) by mouth every 8 (eight) hours.     insulin glargine 100 UNIT/ML injection  Commonly known as:  LANTUS  Inject 50 Units into the skin daily after breakfast.     insulin lispro  100 UNIT/ML injection  Commonly known as:  HUMALOG  Inject 2-14 Units into the skin 3 (three) times daily between meals as needed. sliding scale-150-175=2 units, 176-200=4 units, 201-225=6 units, 226-250=8 units, 251-300=10 units, 301-350=12 units & >350=14 units & contact doctor     ipratropium-albuterol 0.5-2.5 (3) MG/3ML Soln  Commonly known as:  DUONEB  Take 3 mLs by nebulization 3 (three) times daily.     isosorbide mononitrate 60 MG 24 hr tablet  Commonly known as:  IMDUR  Take 1 tablet (60 mg total) by mouth daily.     levothyroxine 100 MCG tablet  Commonly known as:  SYNTHROID, LEVOTHROID  Take 100 mcg by mouth daily before breakfast.     LUMIGAN 0.01 % Soln  Generic drug:  bimatoprost  Place 1 drop into both eyes at bedtime.     metolazone 5 MG tablet  Commonly known as:  ZAROXOLYN  Take 5 mg by mouth daily as needed. Only takes for a weight gain of 5lbs     nebivolol 5 MG tablet  Commonly known as:  BYSTOLIC  Take 1 tablet (5 mg total) by mouth daily.  nitroGLYCERIN 0.4 MG SL tablet  Commonly known as:  NITROSTAT  Place 0.4 mg under the tongue every 5 (five) minutes as needed for chest pain.     omeprazole 20 MG capsule  Commonly known as:  PRILOSEC  Take 20 mg by mouth every morning.     potassium chloride SA 20 MEQ tablet  Commonly known as:  K-DUR,KLOR-CON  Take 1 tablet (20 mEq total) by mouth 2 (two) times daily.     REFRESH OPTIVE ADVANCED 0.5-1-0.5 % Soln  Generic drug:  Carboxymeth-Glycerin-Polysorb  Place 1 drop into both eyes 3 (three) times daily.         SignedRenford Dills D 09/05/2012, 11:02 AM

## 2012-09-05 NOTE — Progress Notes (Signed)
Pt discharged per w/c accompanied by Nurse techs. Pt has all belongings and discharge instructions. Aware of meds she is yet to take tonight. Daughter called does not want to come to room will meet [place] at front entrance with ca,. Also told daughter that her mom has meds to take tonight and about follow up appt. Her mother has to make to see MD within 1 week. Pt has all her personal belongings including her cane.

## 2012-09-05 NOTE — Care Management Note (Addendum)
  Page 2 of 2   09/05/2012     10:59:10 AM   CARE MANAGEMENT NOTE 09/05/2012  Patient:  Wanda [name], Wanda [name]   Account Number:  [id number]  Date Initiated:  09/05/2012  Documentation initiated by:  [facility]  Subjective/Objective Assessment:   67 y.o. female who presents with c/o headache for the past 2 days.  HTN crisis; CHF     Action/Plan:   CXR does confirm mild chf, her headache is improved by controlling her BP with lasix (SBP down to 180). / Home with HH   Anticipated DC Date:  09/08/2012   Anticipated DC Plan:  HOME W HOME HEALTH SERVICES      DC Planning Services  CM consult      [facility] Choice  HOME HEALTH   Choice offered to / List presented to:  C-1 Patient        HH arranged  HH-1 RN  HH-10 DISEASE MANAGEMENT      HH agency  [facility]   Status of service:  Completed, signed off Medicare Important Message given?   (If response is "NO", the following Medicare IM given date fields will be blank) Date Medicare IM given:   Date Additional Medicare IM given:    Discharge Disposition:    Per UR Regulation:  Reviewed for med. necessity/level of care/duration of stay  If discussed at Long Length of Stay Meetings, dates discussed:    Comments:  09/05/12 @1030 .Marland KitchenMarland KitchenOletta Cohn, RN, BSN, [employer] [phone] Spoke with pt regarding discharge planning.  Offered pt [employer] of home health agencies.  Pt chose [facility] to render services of disease management for CHF.  Kizzie Furnish of [facility] notified.  No DME needs identified at this time.

## 2012-09-05 NOTE — Progress Notes (Signed)
Went over discharge info and heart failure booklet with pt. Admitted she ate watermelon and hot dogs at [facility]. Talked with pt about other foods there and different choices she could make. Pt says she weighs herself every other day. Stressed the Daily weight suggestion and why weighing daily is important. Says her sister and her take turns cooking and that she does not add salt to her food. Went over lists of foods high in sodium/salt to avoid.

## 2012-09-05 NOTE — Progress Notes (Signed)
**Note De-Identified Docia Klar Obfuscation** RT assessment: Ventolin and Atrovent HHN TID was discontinued per RT protocol.  Patient continues to have Ventolin MDI 2 puffs Q6 PRN for wheezing and/or SOB.  Upon assessment patient sitting on side of bed eating. Patient states that she feels better and likes to take Ventolin MDI at bedtime.  Patient on RA SAT 96%, BBS clear/mildy diminished in bases.  CXR 09/04/12 indicated mild CHF. RT to cont. to monitor and assess  Respiratory needs.

## 2012-09-05 NOTE — Progress Notes (Addendum)
Physical Therapy Evaluation Patient Details Name: Wanda [name] MRN: [id number] DOB: 1945-09-21 Today's Date: 09/05/2012 Time: 9562-1308 PT Time Calculation (min): 21 min  PT Assessment / Plan / Recommendation Clinical Impression  Pt is a 67 y/o female admitted with diagnosis of Acute on chronic diastolic CHF.  Pt demonstrated independence to modified Independence with mobility.  Pt may be safer with a single point cane as she uses her quad cane incorrectly but pt not interested in changing. Pt appears to be at her functiona baseline and presents with no acute PT needs.     PT Assessment  Patent does not need any further PT services    Follow Up Recommendations  No PT follow up    Does the patient have the potential to tolerate intense rehabilitation      Barriers to Discharge        Equipment Recommendations  None recommended by PT    Recommendations for Other Services     Frequency      Precautions / Restrictions Precautions Precautions: Fall Restrictions Weight Bearing Restrictions: No   Pertinent Vitals/Pain No c/o pain.  SpO2 sats dropped to 88 on room air when pt talked while ambulating.  Pt recovered quickly and SpO2 returned to low-mid 90s for remainder of session on room air.        Mobility  Bed Mobility Bed Mobility: Supine to Sit;Sitting - Scoot to Edge of Bed Supine to Sit: 6: Modified independent (Device/Increase time);HOB elevated Sitting - Scoot to Edge of Bed: 6: Modified independent (Device/Increase time) Details for Bed Mobility Assistance: Pt requires HOB elevation at baseline.   Transfers Transfers: Stand to Sit;Sit to Stand Sit to Stand: 7: Independent;From bed Stand to Sit: 7: Independent;To bed Ambulation/Gait Ambulation/Gait Assistance: 5: Supervision Ambulation Distance (Feet): 160 Feet Assistive device: Small based quad cane Ambulation/Gait Assistance Details: pt only places one leg of the 4 pronged cane on the floor.   Gait Pattern:  Within Functional Limits Gait velocity: slow General Gait Details: 2 standing rest breaks while ambulating.  Stairs: No Wheelchair Mobility Wheelchair Mobility: No    Exercises     PT Diagnosis:    PT Problem List:   PT Treatment Interventions:     PT Goals    Visit Information  Last PT Received On: 09/05/12 Assistance Needed: +1    Subjective Data  Subjective: I thought you could do this while I was sleeping. Patient Stated Goal: none stated by pt.     Prior Functioning  Home Living Lives With: Family (Sister) Available Help at Discharge: Family Type of Home: Apartment Home Access: Level entry Home Layout: One level Bathroom Shower/Tub: Engineer, manufacturing systems: Standard Home Adaptive Equipment: Quad cane Prior Function Level of Independence: Independent with assistive device(s) (quad cane) Able to Take Stairs?: Yes Driving: No Vocation: Unemployed Communication Communication: No difficulties    Cognition  Cognition Arousal/Alertness: Lethargic Behavior During Therapy: Flat affect Overall Cognitive Status: Within Functional Limits for tasks assessed    Extremity/Trunk Assessment Right Lower Extremity Assessment RLE ROM/Strength/Tone: [facility] for tasks assessed Left Lower Extremity Assessment LLE ROM/Strength/Tone: WFL for tasks assessed   Balance    End of Session PT - End of Session Equipment Utilized During Treatment: Gait belt Activity Tolerance: Patient tolerated treatment well Patient left: in bed;with nursing in room Nurse Communication: Mobility status  GP     Trevonn Hallum 09/05/2012, 11:49 AM Theron Arista L. Chilton Si, DPT   Pager [phone]     Cell [phone]

## 2012-10-02 ENCOUNTER — Inpatient Hospital Stay (HOSPITAL_COMMUNITY)
Admission: EM | Admit: 2012-10-02 | Discharge: 2012-10-10 | DRG: 077 | Disposition: A | Discharge status: Skilled Nursing Facility | Payer: PRIVATE HEALTH INSURANCE | Attending: Internal Medicine | Admitting: Internal Medicine

## 2012-10-02 ENCOUNTER — Emergency Department (HOSPITAL_COMMUNITY): Payer: PRIVATE HEALTH INSURANCE

## 2012-10-02 ENCOUNTER — Encounter (HOSPITAL_COMMUNITY): Payer: Self-pay | Admitting: *Deleted

## 2012-10-02 DIAGNOSIS — J9600 Acute respiratory failure, unspecified whether with hypoxia or hypercapnia: Secondary | ICD-10-CM | POA: Diagnosis present

## 2012-10-02 DIAGNOSIS — I5033 Acute on chronic diastolic (congestive) heart failure: Secondary | ICD-10-CM

## 2012-10-02 DIAGNOSIS — I2510 Atherosclerotic heart disease of native coronary artery without angina pectoris: Secondary | ICD-10-CM | POA: Diagnosis present

## 2012-10-02 DIAGNOSIS — I160 Hypertensive urgency: Secondary | ICD-10-CM

## 2012-10-02 DIAGNOSIS — Z9861 Coronary angioplasty status: Secondary | ICD-10-CM

## 2012-10-02 DIAGNOSIS — E119 Type 2 diabetes mellitus without complications: Secondary | ICD-10-CM | POA: Diagnosis present

## 2012-10-02 DIAGNOSIS — Z87891 Personal history of nicotine dependence: Secondary | ICD-10-CM

## 2012-10-02 DIAGNOSIS — I509 Heart failure, unspecified: Secondary | ICD-10-CM | POA: Diagnosis present

## 2012-10-02 DIAGNOSIS — I2789 Other specified pulmonary heart diseases: Secondary | ICD-10-CM | POA: Diagnosis present

## 2012-10-02 DIAGNOSIS — Z794 Long term (current) use of insulin: Secondary | ICD-10-CM

## 2012-10-02 DIAGNOSIS — E039 Hypothyroidism, unspecified: Secondary | ICD-10-CM | POA: Diagnosis present

## 2012-10-02 DIAGNOSIS — J45909 Unspecified asthma, uncomplicated: Secondary | ICD-10-CM | POA: Diagnosis present

## 2012-10-02 DIAGNOSIS — Z9119 Patient's noncompliance with other medical treatment and regimen: Secondary | ICD-10-CM

## 2012-10-02 DIAGNOSIS — I129 Hypertensive chronic kidney disease with stage 1 through stage 4 chronic kidney disease, or unspecified chronic kidney disease: Secondary | ICD-10-CM | POA: Diagnosis present

## 2012-10-02 DIAGNOSIS — I252 Old myocardial infarction: Secondary | ICD-10-CM

## 2012-10-02 DIAGNOSIS — Z79899 Other long term (current) drug therapy: Secondary | ICD-10-CM

## 2012-10-02 DIAGNOSIS — Z7982 Long term (current) use of aspirin: Secondary | ICD-10-CM

## 2012-10-02 DIAGNOSIS — E876 Hypokalemia: Secondary | ICD-10-CM | POA: Diagnosis not present

## 2012-10-02 DIAGNOSIS — I674 Hypertensive encephalopathy: Principal | ICD-10-CM | POA: Diagnosis present

## 2012-10-02 DIAGNOSIS — Z91199 Patient's noncompliance with other medical treatment and regimen due to unspecified reason: Secondary | ICD-10-CM

## 2012-10-02 DIAGNOSIS — K219 Gastro-esophageal reflux disease without esophagitis: Secondary | ICD-10-CM | POA: Diagnosis present

## 2012-10-02 DIAGNOSIS — G9340 Encephalopathy, unspecified: Secondary | ICD-10-CM

## 2012-10-02 DIAGNOSIS — N184 Chronic kidney disease, stage 4 (severe): Secondary | ICD-10-CM | POA: Diagnosis present

## 2012-10-02 DIAGNOSIS — Z7902 Long term (current) use of antithrombotics/antiplatelets: Secondary | ICD-10-CM

## 2012-10-02 LAB — COMPREHENSIVE METABOLIC PANEL
ALT: 18 U/L (ref 0–35)
AST: 20 U/L (ref 0–37)
Albumin: 3.1 g/dL — ABNORMAL LOW (ref 3.5–5.2)
Alkaline Phosphatase: 109 U/L (ref 39–117)
BUN: 24 mg/dL — ABNORMAL HIGH (ref 6–23)
CO2: 22 mEq/L (ref 19–32)
Calcium: 9.2 mg/dL (ref 8.4–10.5)
Chloride: 109 mEq/L (ref 96–112)
Creatinine, Ser: 1.6 mg/dL — ABNORMAL HIGH (ref 0.50–1.10)
GFR calc Af Amer: 38 mL/min — ABNORMAL LOW (ref >90)
GFR calc non Af Amer: 33 mL/min — ABNORMAL LOW (ref >90)
Glucose, Bld: 154 mg/dL — ABNORMAL HIGH (ref 70–99)
Potassium: 3.5 mEq/L (ref 3.5–5.1)
Sodium: 141 mEq/L (ref 135–145)
Total Bilirubin: 0.3 mg/dL (ref 0.3–1.2)
Total Protein: 6.5 g/dL (ref 6.0–8.3)

## 2012-10-02 LAB — CBC
HCT: 35.2 % — ABNORMAL LOW (ref 36.0–46.0)
Hemoglobin: 11.9 g/dL — ABNORMAL LOW (ref 12.0–15.0)
MCH: 29.5 pg (ref 26.0–34.0)
MCHC: 33.8 g/dL (ref 30.0–36.0)
MCV: 87.1 fL (ref 78.0–100.0)

## 2012-10-02 MED ORDER — LABETALOL HCL 5 MG/ML IV SOLN
20.0000 mg | Freq: Once | INTRAVENOUS | Status: AC
  Administered 2012-10-03: 20 mg via INTRAVENOUS
  Filled 2012-10-02: qty 4

## 2012-10-02 NOTE — ED Provider Notes (Addendum)
History    CSN: [id number] Arrival date & time 10/02/12  2025  First MD Initiated Contact with Patient 10/02/12 2325     Chief Complaint  Patient presents with  . Altered Mental Status   (Consider location/radiation/quality/duration/timing/severity/associated sxs/prior Treatment) HPI Wanda [name] is a 67 yo female who presents with multiple chronic medical problems who is brought to the ED  By her two sisters. She lives with one of her sisters. Sisters state that the patient has been persistently confused for the past 2 days.   For instance, she was unable to tell them her name or her street address. She doesn't seem to be able to have an appropriate conversation. She does not have any insight into her condition.   No fever appreciated. No recent med changes. No complaints of headache. No vomiting. No history of similar sx. Patient has been taking medications as prescribed.   Family reports that the patient has been in bed all day.  Past Medical History  Diagnosis Date  . Palpitations   . HTN (hypertension)   . CAD (coronary artery disease)   . Bigeminy   . Obesity   . Adverse effect of anesthesia 05/2009    MI during cardiac  stent placement  . Angina   . Myocardial infarction 05/2009  . Heart murmur   . Asthma   . Hypothyroidism     "started med 01/2011"  . Blood transfusion     "not related to OR" (09/05/2012)  . CHF (congestive heart failure)   . Recurrent upper respiratory infection (URI)     "colds every year"  . Hiatal hernia   . Depression      "lost son 03/2010"  . GERD (gastroesophageal reflux disease)   . 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 Atrial fibrillation   . Chronic kidney disease (CKD), stage III (moderate)     Hattie Perch 09/04/2012 (09/04/2012)  . PONV (postoperative nausea and vomiting)   . Pneumonia     'a couple times; last time 05/2012" (09/05/2012)  . Exertional shortness of breath   . Type II diabetes mellitus   . Iron  deficiency anemia 02/14/11     "I have taken shots for it; I take iron pills now"  . Spinal headache   . Headache     "only related to my blood pressure" (09/05/2012)  . Arthritis     "right arm" (09/05/2012)  . Chronic lower back pain    Past Surgical History  Procedure Laterality Date  . Video assisted thoracoscopy (vats)/ lobectomy Left 04/2010  . Tubal ligation    . Abdominal hysterectomy  ~ 1986  . Laparoscopic salpingoopherectomy Bilateral post 1986  . Refractive surgery  ~ 2009  . Dilation and curettage of uterus    . Loop recorder implant  03/01/12    [employer]  . Coronary angioplasty with stent placement  05/2009     "had MI during this"  . Coronary angioplasty with stent placement  2010   Family History  Problem Relation Age of Onset  . Cancer     History  Substance Use Topics  . Smoking status: Former Smoker -- 1.00 packs/day for 3 years    Types: Cigarettes    Quit date: 03/10/1998  . Smokeless tobacco: Never Used  . Alcohol Use: No     Comment: "quit early 1990's"   OB History   Grav Para Term Preterm Abortions TAB SAB Ect Mult Living  Review of Systems Unable to obtain from patient due to AMS  Allergies  Blue dyes (parenteral); Other; Shrimp; and Erythromycin  Home Medications   Current Outpatient Rx  Name  Route  Sig  Dispense  Refill  . albuterol (PROVENTIL HFA;VENTOLIN HFA) 108 (90 BASE) MCG/ACT inhaler   Inhalation   Inhale 2 puffs into the lungs every 6 (six) hours as needed for wheezing or shortness of breath.          Marland Kitchen aspirin EC 81 MG tablet   Oral   Take 81 mg by mouth daily.         Marland Kitchen atorvastatin (LIPITOR) 10 MG tablet   Oral   Take 1 tablet (10 mg total) by mouth daily at 6 PM.   30 tablet   6   . Bimatoprost (LUMIGAN) 0.01 % SOLN   Both Eyes   Place 1 drop into both eyes at bedtime.          . brimonidine (ALPHAGAN) 0.15 % ophthalmic solution   Both Eyes   Place 1 drop into both eyes 2 (two) times  daily.          . Carboxymeth-Glycerin-Polysorb (REFRESH OPTIVE ADVANCED) 0.5-1-0.5 % SOLN   Both Eyes   Place 1 drop into both eyes 3 (three) times daily.         . clopidogrel (PLAVIX) 75 MG tablet   Oral   Take 75 mg by mouth every morning.          . cyclobenzaprine (FLEXERIL) 5 MG tablet   Oral   Take 5 mg by mouth 2 (two) times daily as needed for muscle spasms.          . ferrous sulfate 325 (65 FE) MG tablet   Oral   Take 325 mg by mouth 2 (two) times daily.           . Fluticasone-Salmeterol (ADVAIR) 100-50 MCG/DOSE AEPB   Inhalation   Inhale 1 puff into the lungs every 12 (twelve) hours.         . furosemide (LASIX) 40 MG tablet   Oral   Take 1 tablet (40 mg total) by mouth daily.   30 tablet   6   . hydrALAZINE (APRESOLINE) 50 MG tablet   Oral   Take 1 tablet (50 mg total) by mouth every 8 (eight) hours.   90 tablet   6   . insulin glargine (LANTUS) 100 UNIT/ML injection   Subcutaneous   Inject 50 Units into the skin daily after breakfast.          . insulin lispro (HUMALOG) 100 UNIT/ML injection   Subcutaneous   Inject 2-14 Units into the skin 3 (three) times daily between meals as needed. sliding scale-150-175=2 units, 176-200=4 units, 201-225=6 units, 226-250=8 units, 251-300=10 units, 301-350=12 units & >350=14 units & contact doctor         . ipratropium-albuterol (DUONEB) 0.5-2.5 (3) MG/3ML SOLN   Nebulization   Take 3 mLs by nebulization 3 (three) times daily.          . isosorbide mononitrate (IMDUR) 60 MG 24 hr tablet   Oral   Take 1 tablet (60 mg total) by mouth daily.   30 tablet   11   . levothyroxine (SYNTHROID, LEVOTHROID) 100 MCG tablet   Oral   Take 100 mcg by mouth daily before breakfast.         . magnesium 30 MG tablet   Oral   Take  30 mg by mouth 2 (two) times daily.         . metolazone (ZAROXOLYN) 5 MG tablet   Oral   Take 5 mg by mouth daily as needed. Only takes for a weight gain of 5lbs          . nebivolol (BYSTOLIC) 5 MG tablet   Oral   Take 1 tablet (5 mg total) by mouth daily.   30 tablet   6   . omeprazole (PRILOSEC) 20 MG capsule   Oral   Take 20 mg by mouth every morning.          . potassium chloride SA (K-DUR,KLOR-CON) 20 MEQ tablet   Oral   Take 1 tablet (20 mEq total) by mouth 2 (two) times daily.   60 tablet   6   . senna (SENOKOT) 8.6 MG TABS   Oral   Take 1 tablet by mouth.         . nitroGLYCERIN (NITROSTAT) 0.4 MG SL tablet   Sublingual   Place 0.4 mg under the tongue every 5 (five) minutes as needed for chest pain.           BP 178/100  Pulse 76  Temp(Src) 98.5 F (36.9 C) (Oral)  Resp 29  SpO2 97% Physical Exam Gen: well developed and well nourished appearing, no acute distress, flat affect Head: NCAT Eyes: PERL, EOMI Nose: no epistaixis or rhinorrhea Mouth/throat: mucosa is moist and pink Neck: supple, no stridor Lungs: CTA B, no wheezing, rhonchi or rales Abd: soft, notender, nondistended Back: no ttp, no cva ttp Skin: no rashese, wnl Neuro: CN ii-xii grossly intact, no focal motor deficits, patient is unable to tell me her birthdate or street address, when I asked her the names of her two sisters sitting beside her she told me the names of her daughters, the patient gives unrelated answers to questions asked and does not seem to know why she is in the ED, I have not attempted to ambulate the patient.  Psyche; flat affect, cooperative, very little insight  ED Course  Procedures (including critical care time)  Results for orders placed during the hospital encounter of 10/02/12 (from the past 48 hour(s))  CBC     Status: Abnormal   Collection Time    10/02/12  8:34 PM      Result Value Range   WBC 6.1  4.0 - 10.5 K/uL   RBC 4.04  3.87 - 5.11 MIL/uL   Hemoglobin 11.9 (*) 12.0 - 15.0 g/dL   HCT 21.3 (*) 08.6 - 57.8 %   MCV 87.1  78.0 - 100.0 fL   MCH 29.5  26.0 - 34.0 pg   MCHC 33.8  30.0 - 36.0 g/dL   RDW 46.9  62.9 - 52.8  %   Platelets 181  150 - 400 K/uL  COMPREHENSIVE METABOLIC PANEL     Status: Abnormal   Collection Time    10/02/12  8:34 PM      Result Value Range   Sodium 141  135 - 145 mEq/L   Potassium 3.5  3.5 - 5.1 mEq/L   Chloride 109  96 - 112 mEq/L   CO2 22  19 - 32 mEq/L   Glucose, Bld 154 (*) 70 - 99 mg/dL   BUN 24 (*) 6 - 23 mg/dL   Creatinine, Ser 4.13 (*) 0.50 - 1.10 mg/dL   Calcium 9.2  8.4 - 24.4 mg/dL   Total Protein 6.5  6.0 - 8.3  g/dL   Albumin 3.1 (*) 3.5 - 5.2 g/dL   AST 20  0 - 37 U/L   ALT 18  0 - 35 U/L   Alkaline Phosphatase 109  39 - 117 U/L   Total Bilirubin 0.3  0.3 - 1.2 mg/dL   GFR calc non Af Amer 33 (*) >90 mL/min   GFR calc Af Amer 38 (*) >90 mL/min   Comment:            The eGFR has been calculated     using the CKD EPI equation.     This calculation has not been     validated in all clinical     situations.     eGFR's persistently     <90 mL/min signify     possible Chronic Kidney Disease.  GLUCOSE, CAPILLARY     Status: Abnormal   Collection Time    10/02/12  8:46 PM      Result Value Range   Glucose-Capillary 137 (*) 70 - 99 mg/dL  AMMONIA     Status: None   Collection Time    10/02/12 11:52 PM      Result Value Range   Ammonia 41  11 - 60 umol/L  LACTIC ACID, PLASMA     Status: None   Collection Time    10/02/12 11:52 PM      Result Value Range   Lactic Acid, Venous 1.0  0.5 - 2.2 mmol/L  MAGNESIUM     Status: None   Collection Time    10/02/12 11:52 PM      Result Value Range   Magnesium 1.9  1.5 - 2.5 mg/dL  POCT I-STAT 3, BLOOD GAS (G3P V)     Status: Abnormal   Collection Time    10/03/12 12:13 AM      Result Value Range   pH, Ven 7.389 (*) 7.250 - 7.300   pCO2, Ven 32.0 (*) 45.0 - 50.0 mmHg   pO2, Ven 50.0 (*) 30.0 - 45.0 mmHg   Bicarbonate 19.3 (*) 20.0 - 24.0 mEq/L   TCO2 20  0 - 100 mmol/L   O2 Saturation 85.0     Acid-base deficit 5.0 (*) 0.0 - 2.0 mmol/L   Sample type VENOUS    PRO B NATRIURETIC PEPTIDE     Status:  Abnormal   Collection Time    10/03/12 12:26 AM      Result Value Range   Pro B Natriuretic peptide (BNP) 9885.0 (*) 0 - 125 pg/mL  URINALYSIS, ROUTINE W REFLEX MICROSCOPIC     Status: Abnormal   Collection Time    10/03/12  3:58 AM      Result Value Range   Color, Urine YELLOW  YELLOW   APPearance CLEAR  CLEAR   Specific Gravity, Urine 1.011  1.005 - 1.030   pH 6.0  5.0 - 8.0   Glucose, UA NEGATIVE  NEGATIVE mg/dL   Hgb urine dipstick TRACE (*) NEGATIVE   Bilirubin Urine NEGATIVE  NEGATIVE   Ketones, ur NEGATIVE  NEGATIVE mg/dL   Protein, ur >409 (*) NEGATIVE mg/dL   Urobilinogen, UA 0.2  0.0 - 1.0 mg/dL   Nitrite NEGATIVE  NEGATIVE   Leukocytes, UA NEGATIVE  NEGATIVE  URINE RAPID DRUG SCREEN (HOSP PERFORMED)     Status: None   Collection Time    10/03/12  3:58 AM      Result Value Range   Opiates NONE DETECTED  NONE DETECTED   Cocaine NONE  DETECTED  NONE DETECTED   Benzodiazepines NONE DETECTED  NONE DETECTED   Amphetamines NONE DETECTED  NONE DETECTED   Tetrahydrocannabinol NONE DETECTED  NONE DETECTED   Barbiturates NONE DETECTED  NONE DETECTED   Comment:            DRUG SCREEN FOR MEDICAL PURPOSES     ONLY.  IF CONFIRMATION IS NEEDED     FOR ANY PURPOSE, NOTIFY LAB     WITHIN 5 DAYS.                LOWEST DETECTABLE LIMITS     FOR URINE DRUG SCREEN     Drug Class       Cutoff (ng/mL)     Amphetamine      1000     Barbiturate      200     Benzodiazepine   200     Tricyclics       300     Opiates          300     Cocaine          300     THC              50  URINE MICROSCOPIC-ADD ON     Status: None   Collection Time    10/03/12  3:58 AM      Result Value Range   WBC, UA 0-2  <3 WBC/hpf   RBC / HPF 0-2  <3 RBC/hpf  POCT I-STAT 3, BLOOD GAS (G3+)     Status: Abnormal   Collection Time    10/03/12  8:09 AM      Result Value Range   pH, Arterial 7.496 (*) 7.350 - 7.450   pCO2 arterial 23.2 (*) 35.0 - 45.0 mmHg   pO2, Arterial 57.0 (*) 80.0 - 100.0 mmHg    Bicarbonate 17.9 (*) 20.0 - 24.0 mEq/L   TCO2 19  0 - 100 mmol/L   O2 Saturation 92.0     Acid-base deficit 4.0 (*) 0.0 - 2.0 mmol/L   Patient temperature 98.7 F     Collection site RADIAL, ALLEN'S TEST ACCEPTABLE     Drawn by Operator     Sample type ARTERIAL     CT brain - NAICP  EKG: nsr, no acute ischemic changes, normal intervals, normal axis, normal qrs complex  MDM  Sister's phone number Noely Kuhnle: [phone]  Head CT negative, no medications that should cause iatrogenic toxicity, hypoglycemia ruled out, no signs of sepsis. BP is very elevated and I suspect the patient is experiencing hypertensive encephalopathy. We are treating with labetalol. Case discussed with Radiology and LP will be performed under flouroscopy.   Brandt Loosen, MD 10/03/12 [phone]  CRITICAL CARE Performed by: Brandt Loosen   Total critical care time: 40m  Critical care time was exclusive of separately billable procedures and treating other patients.  Critical care was necessary to treat or prevent imminent or life-threatening deterioration.  Critical care was time spent personally by me on the following activities: development of treatment plan with patient and/or surrogate as well as nursing, discussions with consultants, evaluation of patient's response to treatment, examination of patient, obtaining history from patient or surrogate, ordering and performing treatments and interventions, ordering and review of laboratory studies, ordering and review of radiographic studies, pulse oximetry and re-evaluation of patient's condition.   Brandt Loosen, MD 10/03/12 [phone]

## 2012-10-02 NOTE — ED Notes (Signed)
Per family pt progressively worsening over past week. Last seen Normal Friday. Pt alert but un oriented to person, place, situation, or time.

## 2012-10-03 ENCOUNTER — Other Ambulatory Visit: Payer: Self-pay | Admitting: *Deleted

## 2012-10-03 ENCOUNTER — Emergency Department (HOSPITAL_COMMUNITY): Payer: PRIVATE HEALTH INSURANCE

## 2012-10-03 ENCOUNTER — Inpatient Hospital Stay (HOSPITAL_COMMUNITY): Payer: PRIVATE HEALTH INSURANCE

## 2012-10-03 ENCOUNTER — Encounter (HOSPITAL_COMMUNITY): Payer: Self-pay | Admitting: Radiology

## 2012-10-03 DIAGNOSIS — I10 Essential (primary) hypertension: Secondary | ICD-10-CM

## 2012-10-03 DIAGNOSIS — G9340 Encephalopathy, unspecified: Secondary | ICD-10-CM

## 2012-10-03 DIAGNOSIS — E119 Type 2 diabetes mellitus without complications: Secondary | ICD-10-CM

## 2012-10-03 LAB — GRAM STAIN

## 2012-10-03 LAB — LACTIC ACID, PLASMA: Lactic Acid, Venous: 1 mmol/L (ref 0.5–2.2)

## 2012-10-03 LAB — URINALYSIS, ROUTINE W REFLEX MICROSCOPIC
Glucose, UA: NEGATIVE mg/dL
Hgb urine dipstick: NEGATIVE
Ketones, ur: NEGATIVE mg/dL
Leukocytes, UA: NEGATIVE
Leukocytes, UA: NEGATIVE
Nitrite: NEGATIVE
Protein, ur: >300 mg/dL — AB
Specific Gravity, Urine: 1.011 (ref 1.005–1.030)
Specific Gravity, Urine: 1.01 (ref 1.005–1.030)
Urobilinogen, UA: 0.2 mg/dL (ref 0.0–1.0)
Urobilinogen, UA: 0.2 mg/dL (ref 0.0–1.0)
pH: 6 (ref 5.0–8.0)

## 2012-10-03 LAB — D-DIMER, QUANTITATIVE: D-Dimer, Quant: 0.73 ug/mL-FEU — ABNORMAL HIGH (ref 0.00–0.48)

## 2012-10-03 LAB — MAGNESIUM: Magnesium: 1.9 mg/dL (ref 1.5–2.5)

## 2012-10-03 LAB — RAPID URINE DRUG SCREEN, HOSP PERFORMED
Amphetamines: NOT DETECTED
Barbiturates: NOT DETECTED

## 2012-10-03 LAB — POCT I-STAT 3, ART BLOOD GAS (G3+)
Acid-base deficit: 4 mmol/L — ABNORMAL HIGH (ref 0.0–2.0)
Bicarbonate: 17.9 mEq/L — ABNORMAL LOW (ref 20.0–24.0)
Patient temperature: 98.7
pH, Arterial: 7.496 — ABNORMAL HIGH (ref 7.350–7.450)
pO2, Arterial: 57 mmHg — ABNORMAL LOW (ref 80.0–100.0)

## 2012-10-03 LAB — POCT I-STAT 3, VENOUS BLOOD GAS (G3P V)
Acid-base deficit: 5 mmol/L — ABNORMAL HIGH (ref 0.0–2.0)
Bicarbonate: 19.3 mEq/L — ABNORMAL LOW (ref 20.0–24.0)
pH, Ven: 7.389 — ABNORMAL HIGH (ref 7.250–7.300)

## 2012-10-03 LAB — CSF CELL COUNT WITH DIFFERENTIAL
RBC Count, CSF: 1 /mm3 — ABNORMAL HIGH
RBC Count, CSF: 2 /mm3 — ABNORMAL HIGH
Tube #: 3
WBC, CSF: 1 /mm3 (ref 0–5)
WBC, CSF: 2 /mm3 (ref 0–5)

## 2012-10-03 LAB — URINE MICROSCOPIC-ADD ON

## 2012-10-03 LAB — TSH: TSH: 2.685 u[IU]/mL (ref 0.350–4.500)

## 2012-10-03 LAB — AMMONIA
Ammonia: 41 umol/L (ref 11–60)
Ammonia: 34 umol/L (ref 11–60)

## 2012-10-03 LAB — GLUCOSE, CAPILLARY
Glucose-Capillary: 120 mg/dL — ABNORMAL HIGH (ref 70–99)
Glucose-Capillary: 105 mg/dL — ABNORMAL HIGH (ref 70–99)

## 2012-10-03 LAB — PRO B NATRIURETIC PEPTIDE: Pro B Natriuretic peptide (BNP): 9885 pg/mL — ABNORMAL HIGH (ref 0–125)

## 2012-10-03 LAB — TROPONIN I: Troponin I: <0.3 ng/mL (ref <0.30)

## 2012-10-03 MED ORDER — FUROSEMIDE 10 MG/ML IJ SOLN
40.0000 mg | Freq: Every day | INTRAMUSCULAR | Status: DC
  Administered 2012-10-03 – 2012-10-04 (×2): 40 mg via INTRAVENOUS
  Filled 2012-10-03 (×2): qty 4

## 2012-10-03 MED ORDER — LORAZEPAM 2 MG/ML IJ SOLN
2.0000 mg | Freq: Once | INTRAMUSCULAR | Status: DC
  Filled 2012-10-03: qty 1

## 2012-10-03 MED ORDER — SODIUM CHLORIDE 0.9 % IV SOLN
INTRAVENOUS | Status: DC
  Administered 2012-10-03: 12:00:00 via INTRAVENOUS

## 2012-10-03 MED ORDER — ASPIRIN 300 MG RE SUPP
300.0000 mg | Freq: Every day | RECTAL | Status: DC
  Administered 2012-10-03 – 2012-10-04 (×2): 300 mg via RECTAL
  Filled 2012-10-03 (×2): qty 1

## 2012-10-03 MED ORDER — LEVOTHYROXINE SODIUM 100 MCG IV SOLR
50.0000 ug | Freq: Every day | INTRAVENOUS | Status: DC
  Administered 2012-10-03 – 2012-10-04 (×2): 50 ug via INTRAVENOUS
  Filled 2012-10-03 (×5): qty 5

## 2012-10-03 MED ORDER — LABETALOL HCL 5 MG/ML IV SOLN
10.0000 mg | INTRAVENOUS | Status: DC | PRN
  Administered 2012-10-04: 20 mg via INTRAVENOUS
  Filled 2012-10-03 (×2): qty 4

## 2012-10-03 MED ORDER — LEVALBUTEROL HCL 0.63 MG/3ML IN NEBU
0.6300 mg | INHALATION_SOLUTION | Freq: Four times a day (QID) | RESPIRATORY_TRACT | Status: DC | PRN
  Filled 2012-10-03 (×2): qty 3

## 2012-10-03 MED ORDER — LABETALOL HCL 5 MG/ML IV SOLN
10.0000 mg | Freq: Once | INTRAVENOUS | Status: AC
  Administered 2012-10-03: 10 mg via INTRAVENOUS

## 2012-10-03 MED ORDER — ONDANSETRON HCL 4 MG/2ML IJ SOLN: 4.0000 mg | Freq: Four times a day (QID) | INTRAMUSCULAR | Status: DC | PRN

## 2012-10-03 MED ORDER — LABETALOL HCL 5 MG/ML IV SOLN
10.0000 mg | INTRAVENOUS | Status: DC | PRN
  Administered 2012-10-03: 10 mg via INTRAVENOUS

## 2012-10-03 MED ORDER — HYDRALAZINE HCL 20 MG/ML IJ SOLN
10.0000 mg | Freq: Four times a day (QID) | INTRAMUSCULAR | Status: DC | PRN
Start: 2012-10-03 — End: 2012-10-09
  Administered 2012-10-03: 10 mg via INTRAVENOUS
  Administered 2012-10-04 – 2012-10-07 (×4): 20 mg via INTRAVENOUS
  Filled 2012-10-03 (×2): qty 1
  Filled 2012-10-03 (×2): qty 2
  Filled 2012-10-03: qty 1

## 2012-10-03 MED ORDER — LIDOCAINE HCL (PF) 1 % IJ SOLN
INTRAMUSCULAR | Status: AC
  Filled 2012-10-03: qty 10

## 2012-10-03 MED ORDER — INSULIN GLARGINE 100 UNIT/ML ~~LOC~~ SOLN
30.0000 [IU] | Freq: Every day | SUBCUTANEOUS | Status: DC
  Administered 2012-10-03 – 2012-10-10 (×8): 30 [IU] via SUBCUTANEOUS
  Filled 2012-10-03 (×9): qty 0.3

## 2012-10-03 MED ORDER — HYDRALAZINE HCL 20 MG/ML IJ SOLN
10.0000 mg | Freq: Four times a day (QID) | INTRAMUSCULAR | Status: DC
  Administered 2012-10-03 – 2012-10-04 (×4): 10 mg via INTRAVENOUS
  Filled 2012-10-03 (×2): qty 0.5
  Filled 2012-10-03 (×3): qty 1
  Filled 2012-10-03 (×2): qty 0.5

## 2012-10-03 MED ORDER — ONDANSETRON HCL 4 MG PO TABS: 4.0000 mg | ORAL_TABLET | Freq: Four times a day (QID) | ORAL | Status: DC | PRN

## 2012-10-03 MED ORDER — NICARDIPINE HCL IN NACL 20-0.86 MG/200ML-% IV SOLN
5.0000 mg/h | Freq: Once | INTRAVENOUS | Status: AC
  Administered 2012-10-03: 5 mg/h via INTRAVENOUS
  Filled 2012-10-03: qty 200

## 2012-10-03 MED ORDER — BRIMONIDINE TARTRATE 0.15 % OP SOLN
1.0000 [drp] | Freq: Two times a day (BID) | OPHTHALMIC | Status: DC
  Filled 2012-10-03: qty 5

## 2012-10-03 MED ORDER — LABETALOL HCL 5 MG/ML IV SOLN
20.0000 mg | Freq: Once | INTRAVENOUS | Status: AC
  Administered 2012-10-03: 20 mg via INTRAVENOUS
  Filled 2012-10-03: qty 4

## 2012-10-03 MED ORDER — HYDRALAZINE HCL 20 MG/ML IJ SOLN
10.0000 mg | Freq: Four times a day (QID) | INTRAMUSCULAR | Status: DC | PRN
  Administered 2012-10-03: 10 mg via INTRAVENOUS
  Filled 2012-10-03: qty 1

## 2012-10-03 MED ORDER — ACETAMINOPHEN 650 MG RE SUPP: 650.0000 mg | Freq: Four times a day (QID) | RECTAL | Status: DC | PRN

## 2012-10-03 MED ORDER — LABETALOL HCL 5 MG/ML IV SOLN
10.0000 mg | Freq: Four times a day (QID) | INTRAVENOUS | Status: DC
  Administered 2012-10-03 – 2012-10-04 (×4): 10 mg via INTRAVENOUS
  Filled 2012-10-03 (×6): qty 4

## 2012-10-03 MED ORDER — ACETAMINOPHEN 325 MG PO TABS
650.0000 mg | ORAL_TABLET | Freq: Four times a day (QID) | ORAL | Status: DC | PRN
  Filled 2012-10-03: qty 2

## 2012-10-03 MED ORDER — BRIMONIDINE TARTRATE 0.2 % OP SOLN
1.0000 [drp] | Freq: Two times a day (BID) | OPHTHALMIC | Status: DC
  Administered 2012-10-03 – 2012-10-10 (×15): 1 [drp] via OPHTHALMIC
  Filled 2012-10-03 (×2): qty 5

## 2012-10-03 MED ORDER — FUROSEMIDE 40 MG PO TABS: 40.0000 mg | ORAL_TABLET | Freq: Every day | ORAL | Status: DC

## 2012-10-03 MED ORDER — LABETALOL HCL 5 MG/ML IV SOLN
INTRAVENOUS | Status: AC
  Filled 2012-10-03: qty 4

## 2012-10-03 MED ORDER — INSULIN ASPART 100 UNIT/ML ~~LOC~~ SOLN
0.0000 [IU] | Freq: Three times a day (TID) | SUBCUTANEOUS | Status: DC
  Administered 2012-10-03: 2 [IU] via SUBCUTANEOUS
  Administered 2012-10-08: 3 [IU] via SUBCUTANEOUS

## 2012-10-03 MED ORDER — NICARDIPINE HCL IN NACL 20-0.86 MG/200ML-% IV SOLN: 5.0000 mg/h | INTRAVENOUS | Status: DC

## 2012-10-03 NOTE — Progress Notes (Signed)
Portable EEG completed

## 2012-10-03 NOTE — Procedures (Signed)
Lumbar puncture performed at L3-4 on right with 20 G spinal needle.  Other levels were attempted as well, detailed in radiology dictation.  8 cc's CSF removed without complication.

## 2012-10-03 NOTE — ED Notes (Signed)
Called to [phone]

## 2012-10-03 NOTE — ED Notes (Signed)
Pt in xray

## 2012-10-03 NOTE — ED Notes (Signed)
Came in roomTo give labetalol dr Lavella Lemons came in room also she is on phone  I   , checked w/ dr Lavella Lemons told her I had not given med yet and she states to hold it.  Which I did

## 2012-10-03 NOTE — Progress Notes (Signed)
[facility]  Patient Status: Active (receiving services up to time of hospitalization)  [facility] is providing the following services: RN  If patient discharges after hours, please call [phone].   Wanda [name] 10/03/2012, 11:19 AM

## 2012-10-03 NOTE — ED Notes (Signed)
Pt still in xray having lumbar puncture

## 2012-10-03 NOTE — Clinical Documentation Improvement (Signed)
THIS DOCUMENT IS NOT A PERMANENT PART OF THE MEDICAL RECORD  Please update your documentation with the medical record to reflect your response to this query. If you need help knowing how to do this please call [phone].  10/03/12  Dear Dr. Nehemiah Settle Marton Redwood  In an effort to better capture your patient's severity of illness, reflect appropriate length of stay and utilization of resources, a review of the patient medical record has revealed the following indicators.   Based on your clinical judgment, please clarify and document in a progress note and/or discharge summary the clinical condition associated with the following supporting information: In responding to this query please exercise your independent judgment.  The fact that a query is asked, does not imply that any particular answer is desired or expected.  Please clarify Type of Hypertension. Thank you.  Possible Clinical Conditions?   " Accelerated Hypertension  " Malignant Hypertension  " Or Other Condition __________________________  " Cannot Clinically Determine   Supporting Information:  Risk Factors: Admitted with Hypertensive Encephalopathy H&P states "hypertensive urgency"   Signs and Symptoms: 10/02/12 SBP range:  218-185 DBP range:102-87 10/03/12 SBP range:  211 - 148 DBP range: 92-61  Diagnostics: Troponin level: < 30  Echo ordered   Treatment:  IV Medications:  Hydralazine 10mg  IV q6h and 10-40mg  q6h prn Labetalol 10mg  q6h and 10-20mg  q2h prn Cardene 50mg /h x 1 dose You may use possible, probable, or suspect with inpatient documentation. Possible, probable, suspected diagnoses MUST be documented at the time of discharge.  Reviewed: Responded in 7/2 pn = malignant hypertension - Dr. Nehemiah Settle  Thank You,  Harless Litten RN, MSN Clinical Documentation Specialist: Office# [phone] Health Information Management [place]

## 2012-10-03 NOTE — Consult Note (Signed)
NEURO HOSPITALIST CONSULT NOTE    Reason for Consult: 2 day history of confusion  HPI:                                                                                                                                          Wanda [name] is an 67 y.o. female who was brought to the hospital after a 2 day progression of decreased mental status.  Much of history obtained from chart as patient will not answer questions and only responds to noxious stimuli. Per note, patient has remained confused over last 2 days, unable to hold appropriate conversation.  On consultation patient is in room breathing heavily but O2 saturations 100% at room air. Patient will not answer any questions but when sternal rub given she quickly opens eyes and localizes to pain with both extremities.  If bothered enough she will tel me she is "at [facility], month is June and year is 2014".  She is moving all extremities spontaneously and withdrawals briskly to pain--at time telling me to"stop doing that" and actually sitting up in bed. Looking through chart, on admission her presenting with Systolic BP was 218 and since then ranging from 150-218. BNP 9885 and initial PO2 was 57.  Neurology was consulted for AMS.  LP has been obtained and blood cultures pending  Past Medical History  Diagnosis Date  . Palpitations   . HTN (hypertension)   . CAD (coronary artery disease)   . Bigeminy   . Obesity   . Adverse effect of anesthesia 05/2009    MI during cardiac  stent placement  . Angina   . Myocardial infarction 05/2009  . Heart murmur   . Asthma   . Hypothyroidism     "started med 01/2011"  . Blood transfusion     "not related to OR" (09/05/2012)  . CHF (congestive heart failure)   . Recurrent upper respiratory infection (URI)     "colds every year"  . Hiatal hernia   . Depression      "lost son 03/2010"  . GERD (gastroesophageal reflux disease)   . 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 Atrial fibrillation   . Chronic kidney disease (CKD), stage III (moderate)     Hattie Perch 09/04/2012 (09/04/2012)  . PONV (postoperative nausea and vomiting)   . Pneumonia     'a couple times; last time 05/2012" (09/05/2012)  . Exertional shortness of breath   . Type II diabetes mellitus   . Iron deficiency anemia 02/14/11     "I have taken shots for it; I take iron pills now"  . Spinal headache   . Headache     "only related to my blood pressure" (09/05/2012)  . Arthritis     "  right arm" (09/05/2012)  . Chronic lower back pain     Past Surgical History  Procedure Laterality Date  . Video assisted thoracoscopy (vats)/ lobectomy Left 04/2010  . Tubal ligation    . Abdominal hysterectomy  ~ 1986  . Laparoscopic salpingoopherectomy Bilateral post 1986  . Refractive surgery  ~ 2009  . Dilation and curettage of uterus    . Loop recorder implant  03/01/12    [employer]tr[employer]  . Coronary angioplasty with stent placement  05/2009     "had MI during this"  . Coronary angioplasty with stent placement  2010    Family History  Problem Relation Age of Onset  . Cancer       Social History:  reports that she quit smoking about 14 years ago. Her smoking use included Cigarettes. She has a 3 pack-year smoking history. She has never used smokeless tobacco. She reports that she uses illicit drugs (Marijuana). She reports that she does not drink alcohol.  Allergies  Allergen Reactions  . Blue Dyes (Parenteral) Swelling       . Other     Cardiologist told patient not to take anything that affects "QT"  . Shrimp (Shellfish Allergy)     ONLY had a reaction once (face swelled up)---has been able to eat shrimp and shellfish every other time   . Erythromycin Palpitations    MEDICATIONS:                                                                                                                     Prior to Admission:  Prescriptions prior to admission  Medication Sig Dispense Refill   . albuterol (PROVENTIL HFA;VENTOLIN HFA) 108 (90 BASE) MCG/ACT inhaler Inhale 2 puffs into the lungs every 6 (six) hours as needed for wheezing or shortness of breath.       Marland Kitchen aspirin EC 81 MG tablet Take 81 mg by mouth daily.      Marland Kitchen atorvastatin (LIPITOR) 10 MG tablet Take 1 tablet (10 mg total) by mouth daily at 6 PM.  30 tablet  6  . Bimatoprost (LUMIGAN) 0.01 % SOLN Place 1 drop into both eyes at bedtime.       . brimonidine (ALPHAGAN) 0.15 % ophthalmic solution Place 1 drop into both eyes 2 (two) times daily.       . Carboxymeth-Glycerin-Polysorb (REFRESH OPTIVE ADVANCED) 0.5-1-0.5 % SOLN Place 1 drop into both eyes 3 (three) times daily.      . clopidogrel (PLAVIX) 75 MG tablet Take 75 mg by mouth every morning.       . cyclobenzaprine (FLEXERIL) 5 MG tablet Take 5 mg by mouth 2 (two) times daily as needed for muscle spasms.       . ferrous sulfate 325 (65 FE) MG tablet Take 325 mg by mouth 2 (two) times daily.        . Fluticasone-Salmeterol (ADVAIR) 100-50 MCG/DOSE AEPB Inhale 1 puff into the lungs every 12 (twelve) hours.      Marland Kitchen  hydrALAZINE (APRESOLINE) 50 MG tablet Take 1 tablet (50 mg total) by mouth every 8 (eight) hours.  90 tablet  6  . insulin glargine (LANTUS) 100 UNIT/ML injection Inject 50 Units into the skin daily after breakfast.       . insulin lispro (HUMALOG) 100 UNIT/ML injection Inject 2-14 Units into the skin 3 (three) times daily between meals as needed. sliding scale-150-175=2 units, 176-200=4 units, 201-225=6 units, 226-250=8 units, 251-300=10 units, 301-350=12 units & >350=14 units & contact doctor      . ipratropium-albuterol (DUONEB) 0.5-2.5 (3) MG/3ML SOLN Take 3 mLs by nebulization 3 (three) times daily.       . isosorbide mononitrate (IMDUR) 60 MG 24 hr tablet Take 1 tablet (60 mg total) by mouth daily.  30 tablet  11  . levothyroxine (SYNTHROID, LEVOTHROID) 100 MCG tablet Take 100 mcg by mouth daily before breakfast.      . magnesium 30 MG tablet Take 30 mg by  mouth 2 (two) times daily.      . metolazone (ZAROXOLYN) 5 MG tablet Take 5 mg by mouth daily as needed. Only takes for a weight gain of 5lbs      . nebivolol (BYSTOLIC) 5 MG tablet Take 1 tablet (5 mg total) by mouth daily.  30 tablet  6  . omeprazole (PRILOSEC) 20 MG capsule Take 20 mg by mouth every morning.       . potassium chloride SA (K-DUR,KLOR-CON) 20 MEQ tablet Take 1 tablet (20 mEq total) by mouth 2 (two) times daily.  60 tablet  6  . senna (SENOKOT) 8.6 MG TABS Take 1 tablet by mouth.      . furosemide (LASIX) 40 MG tablet Take 1 tablet (40 mg total) by mouth daily.  30 tablet  6  . nitroGLYCERIN (NITROSTAT) 0.4 MG SL tablet Place 0.4 mg under the tongue every 5 (five) minutes as needed for chest pain.        Scheduled: . aspirin  300 mg Rectal Daily  . brimonidine  1 drop Both Eyes BID  . furosemide  40 mg Intravenous Daily  . insulin aspart  0-15 Units Subcutaneous TID WC  . insulin glargine  30 Units Subcutaneous Daily  . levothyroxine  50 mcg Intravenous Daily  . lidocaine (PF)      . LORazepam  2 mg Intravenous Once  . niCARDipine  5 mg/hr Intravenous Once     ROS:                                                                                                                                       History obtained from unobtainable from patient due to mental status and lack of cooperation    Blood pressure 199/91, pulse 59, temperature 98.5 F (36.9 C), temperature source Oral, resp. rate 19, SpO2 100.00%.   Neurologic Examination:  Mental Status:  In room breathing heavily but O2 saturaions 100% at room air. Patient will not answer any questions but when sternal rub given she quickly opens eyes and localizes to pain with both extremities.  If bothered enough she will tel me she is "at [facility], month is June and year is 2014".   Cranial Nerves: II: Discs flat  bilaterally; hard to assess as she will clench her eye shut, but she does blink to threat bilaterally, pupils equal, round, reactive to light and accommodation III,IV, VI: ptosis not present, again--difficult to assess as patient clenches eyes shut but does show extra-ocular motions intact bilaterally V,VII: face symmetric, facial light touch sensation normal bilaterally VIII: hearing normal bilaterally IX,X: gag reflex present XI: bilateral shoulder shrug XII: midline tongue extension Motor: Moving all extremities antigravity with good strength when noxious stimuli given Tone and bulk:normal tone throughout; no atrophy noted Sensory: Pinprick and light touch intact throughout, bilaterally Deep Tendon Reflexes:  Right: Upper Extremity   Left: Upper extremity   biceps (C-5 to C-6) 2/4   biceps (C-5 to C-6) 2/4 tricep (C7) 2/4    triceps (C7) 2/4 Brachioradialis (C6) 2/4  Brachioradialis (C6) 2/4  Lower Extremity Lower Extremity  quadriceps (L-2 to L-4) 2/4   quadriceps (L-2 to L-4) 2/4 Achilles (S1) 0/4   Achilles (S1) 0/4  Plantars: Right: downgoing   Left: downgoing Cerebellar: Unable to assess Gait: unable to assess CV: pulses palpable throughout    Lab Results  Component Value Date/Time   CHOL 194 12/29/2010  5:51 AM    Results for orders placed during the hospital encounter of 10/02/12 (from the past 48 hour(s))  CBC     Status: Abnormal   Collection Time    10/02/12  8:34 PM      Result Value Range   WBC 6.1  4.0 - 10.5 K/uL   RBC 4.04  3.87 - 5.11 MIL/uL   Hemoglobin 11.9 (*) 12.0 - 15.0 g/dL   HCT 16.1 (*) 09.6 - 04.5 %   MCV 87.1  78.0 - 100.0 fL   MCH 29.5  26.0 - 34.0 pg   MCHC 33.8  30.0 - 36.0 g/dL   RDW 40.9  81.1 - 91.4 %   Platelets 181  150 - 400 K/uL  COMPREHENSIVE METABOLIC PANEL     Status: Abnormal   Collection Time    10/02/12  8:34 PM      Result Value Range   Sodium 141  135 - 145 mEq/L   Potassium 3.5  3.5 - 5.1 mEq/L   Chloride 109  96 -  112 mEq/L   CO2 22  19 - 32 mEq/L   Glucose, Bld 154 (*) 70 - 99 mg/dL   BUN 24 (*) 6 - 23 mg/dL   Creatinine, Ser 7.82 (*) 0.50 - 1.10 mg/dL   Calcium 9.2  8.4 - 95.6 mg/dL   Total Protein 6.5  6.0 - 8.3 g/dL   Albumin 3.1 (*) 3.5 - 5.2 g/dL   AST 20  0 - 37 U/L   ALT 18  0 - 35 U/L   Alkaline Phosphatase 109  39 - 117 U/L   Total Bilirubin 0.3  0.3 - 1.2 mg/dL   GFR calc non Af Amer 33 (*) >90 mL/min   GFR calc Af Amer 38 (*) >90 mL/min   Comment:            The eGFR has been calculated     using  the CKD EPI equation.     This calculation has not been     validated in all clinical     situations.     eGFR's persistently     <90 mL/min signify     possible Chronic Kidney Disease.  GLUCOSE, CAPILLARY     Status: Abnormal   Collection Time    10/02/12  8:46 PM      Result Value Range   Glucose-Capillary 137 (*) 70 - 99 mg/dL  AMMONIA     Status: None   Collection Time    10/02/12 11:52 PM      Result Value Range   Ammonia 41  11 - 60 umol/L  LACTIC ACID, PLASMA     Status: None   Collection Time    10/02/12 11:52 PM      Result Value Range   Lactic Acid, Venous 1.0  0.5 - 2.2 mmol/L  MAGNESIUM     Status: None   Collection Time    10/02/12 11:52 PM      Result Value Range   Magnesium 1.9  1.5 - 2.5 mg/dL  POCT I-STAT 3, BLOOD GAS (G3P V)     Status: Abnormal   Collection Time    10/03/12 12:13 AM      Result Value Range   pH, Ven 7.389 (*) 7.250 - 7.300   pCO2, Ven 32.0 (*) 45.0 - 50.0 mmHg   pO2, Ven 50.0 (*) 30.0 - 45.0 mmHg   Bicarbonate 19.3 (*) 20.0 - 24.0 mEq/L   TCO2 20  0 - 100 mmol/L   O2 Saturation 85.0     Acid-base deficit 5.0 (*) 0.0 - 2.0 mmol/L   Sample type VENOUS    PRO B NATRIURETIC PEPTIDE     Status: Abnormal   Collection Time    10/03/12 12:26 AM      Result Value Range   Pro B Natriuretic peptide (BNP) 9885.0 (*) 0 - 125 pg/mL  URINALYSIS, ROUTINE W REFLEX MICROSCOPIC     Status: Abnormal   Collection Time    10/03/12  3:58 AM       Result Value Range   Color, Urine YELLOW  YELLOW   APPearance CLEAR  CLEAR   Specific Gravity, Urine 1.011  1.005 - 1.030   pH 6.0  5.0 - 8.0   Glucose, UA NEGATIVE  NEGATIVE mg/dL   Hgb urine dipstick TRACE (*) NEGATIVE   Bilirubin Urine NEGATIVE  NEGATIVE   Ketones, ur NEGATIVE  NEGATIVE mg/dL   Protein, ur >478 (*) NEGATIVE mg/dL   Urobilinogen, UA 0.2  0.0 - 1.0 mg/dL   Nitrite NEGATIVE  NEGATIVE   Leukocytes, UA NEGATIVE  NEGATIVE  URINE RAPID DRUG SCREEN (HOSP PERFORMED)     Status: None   Collection Time    10/03/12  3:58 AM      Result Value Range   Opiates NONE DETECTED  NONE DETECTED   Cocaine NONE DETECTED  NONE DETECTED   Benzodiazepines NONE DETECTED  NONE DETECTED   Amphetamines NONE DETECTED  NONE DETECTED   Tetrahydrocannabinol NONE DETECTED  NONE DETECTED   Barbiturates NONE DETECTED  NONE DETECTED   Comment:            DRUG SCREEN FOR MEDICAL PURPOSES     ONLY.  IF CONFIRMATION IS NEEDED     FOR ANY PURPOSE, NOTIFY LAB     WITHIN 5 DAYS.  LOWEST DETECTABLE LIMITS     FOR URINE DRUG SCREEN     Drug Class       Cutoff (ng/mL)     Amphetamine      1000     Barbiturate      200     Benzodiazepine   200     Tricyclics       300     Opiates          300     Cocaine          300     THC              50  URINE MICROSCOPIC-ADD ON     Status: None   Collection Time    10/03/12  3:58 AM      Result Value Range   WBC, UA 0-2  <3 WBC/hpf   RBC / HPF 0-2  <3 RBC/hpf  POCT I-STAT 3, BLOOD GAS (G3+)     Status: Abnormal   Collection Time    10/03/12  8:09 AM      Result Value Range   pH, Arterial 7.496 (*) 7.350 - 7.450   pCO2 arterial 23.2 (*) 35.0 - 45.0 mmHg   pO2, Arterial 57.0 (*) 80.0 - 100.0 mmHg   Bicarbonate 17.9 (*) 20.0 - 24.0 mEq/L   TCO2 19  0 - 100 mmol/L   O2 Saturation 92.0     Acid-base deficit 4.0 (*) 0.0 - 2.0 mmol/L   Patient temperature 98.7 F     Collection site RADIAL, ALLEN'S TEST ACCEPTABLE     Drawn by Operator      Sample type ARTERIAL    TROPONIN I     Status: None   Collection Time    10/03/12  8:13 AM      Result Value Range   Troponin I <0.30  <0.30 ng/mL   Comment:            Due to the release kinetics of cTnI,     a negative result within the first hours     of the onset of symptoms does not rule out     myocardial infarction with certainty.     If myocardial infarction is still suspected,     repeat the test at appropriate intervals.  AMMONIA     Status: None   Collection Time    10/03/12  8:14 AM      Result Value Range   Ammonia 34  11 - 60 umol/L    Ct Head Wo Contrast  10/03/2012   *RADIOLOGY REPORT*  Clinical Data: Altered mental status.  CT HEAD WITHOUT CONTRAST  Technique:  Contiguous axial images were obtained from the base of the skull through the vertex without contrast.  Comparison: 09/04/2012  Findings: There is atrophy and chronic small vessel disease changes. No acute intracranial abnormality.  Specifically, no hemorrhage, hydrocephalus, mass lesion, acute infarction, or significant intracranial injury.  No acute calvarial abnormality. Visualized paranasal sinuses and mastoids clear.  Orbital soft tissues unremarkable.  IMPRESSION: No acute intracranial abnormality.  Atrophy, chronic microvascular disease.   Original Report Authenticated By: Charlett Nose, M.D.   Dg Chest Port 1 View  10/03/2012   *RADIOLOGY REPORT*  Clinical Data: Altered mental status, shortness of breath.  PORTABLE CHEST - 1 VIEW  Comparison: 09/04/2012  Findings: Cardiomegaly with vascular congestion.  Possible early/slight interstitial edema.  No confluent opacities or effusions.  Stable slight elevation of the  left hemidiaphragm.  No acute bony abnormality.  IMPRESSION: Cardiomegaly with vascular congestion.  Question early interstitial edema.   Original Report Authenticated By: Charlett Nose, M.D.   Dg Fluoro Guide Lumbar Puncture  10/03/2012   *RADIOLOGY REPORT*  Clinical Data:  Altered mental status.   DIAGNOSTIC LUMBAR PUNCTURE UNDER FLUOROSCOPIC GUIDANCE  Fluoroscopy time:  1 minute 28 seconds  Technique:  Time-out procedure was performed.  Informed consent was obtained from the patient prior to the procedure, including potential complications of headache, allergy, CSF leak and pain.  With the patient prone, the lower back was prepped with Betadine. 1% Lidocaine was used for local anesthesia.  Lumbar puncture was initially attempted on the right at the L2-L3 level but was unsuccessful. Successful lumbar puncture was performed at the L3-L4 level on theright using a 21 gauge needle with return of clear colorless CSF with an opening pressure of 19 cm water measured prone through the needle.   8 ml of CSF were obtained for laboratory studies.  The patient tolerated the procedure well and there were no apparent complications.  IMPRESSION: Diagnostic lumbar puncture performed without any complications.   Original Report Authenticated By: Carey Bullocks, M.D.     Assessment/Plan: 67 YO female presenting to hospital afebrile with systolic BP  216, SPO2 50, and BNP 9885 for two days of AMS.  Initial CT head shows no acute infarct or bleed. Exam shows a lethargic female who arouses briskly to noxious stimuli and able to tell me date, year and place if given enough stimuli. Exam was difficult as patient would not participate and would hold eyes clenched tightly.  No meningismus, focal localizing or lateralizing symptoms were noted. LP and blood cultures have bee obtained and are pending. Likely hypertensive encephalopathy, however given multiple stroke risk factors and history Afib (currently NSR) must R/O CVA.   Recommend: 1) MRI brain 2) EEG 3) TSH level 4) Continue to treat BP and respiratory issues.   Felicie Morn PA-C [facility] [phone]  I personally participated in this patient's evaluation and management, including formulating the above clinical assessment and management  recommendations.  Venetia Maxon M.D. [facility] [phone]  10/03/2012, 10:29 AM

## 2012-10-03 NOTE — Significant Event (Signed)
Rapid Response Event Note  Overview: Time Called: 1217 Arrival Time: 1220 Event Type: Neurologic;Other (Comment) (HTN)  Initial Focused Assessment: Called to patient's bedside because patient hypertensive and MD order for Cardene. Patient returned from lumbar puncture about 1030 this am. BP 197/71  Hr 71  RR 16-28  O2 sat 99% Temp 98.1 oral Patient shivering, patient states she is cold. Patient difficult to arouse. Will answer orientation questions once awake, intermittently following commands.   Interventions: MD order for Cardene gtt and ICU transfer. Cardene infused about 15 minutes  BP 177/58. CCM at bedside prior to transfer.  Will adjust medications and monitor in SDU. Patient more awake and interactive with staff.  Answering questions and following commands easily. RN to call if assistance needed.  Event Summary: Name of Physician Notified: Dr Susie Cassette at 1215    at    Outcome: Stayed in room and stabalized  Event End Time: 1317  Marcellina Millin

## 2012-10-03 NOTE — H&P (Signed)
[facility] History and Physical  Wanda [name] WUJ:[id number] DOB: April 15, 1945 DOA: 10/02/2012  Referring physician:  PCP: Katy Apo, MD   Chief Complaint: Altered mental status HPI:  67 year old female brought to the ER because of altered mental status, patient has progressively had a change in her aspect of the last one week. Week ago on Friday the patient was appropriate and oriented. No documented fevers. Patient is not able to provide any history at this time. As per sister the patient has not been started on any new medications. Workup in the ER revealed that the patient is fairly hypoxic. His father have an oxygen saturation of 85%. No documented seizures. Patient recently admitted on 5/28 with shortness of breath, CHF exacerbation.      Review of Systems: negative for the following   Unable to obtain      Past Medical History  Diagnosis Date  . Palpitations   . HTN (hypertension)   . CAD (coronary artery disease)   . Bigeminy   . Obesity   . Adverse effect of anesthesia 05/2009    MI during cardiac  stent placement  . Angina   . Myocardial infarction 05/2009  . Heart murmur   . Asthma   . Hypothyroidism     "started med 01/2011"  . Blood transfusion     "not related to OR" (09/05/2012)  . CHF (congestive heart failure)   . Recurrent upper respiratory infection (URI)     "colds every year"  . Hiatal hernia   . Depression      "lost son 03/2010"  . GERD (gastroesophageal reflux disease)   . 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 Atrial fibrillation   . Chronic kidney disease (CKD), stage III (moderate)     Hattie Perch 09/04/2012 (09/04/2012)  . PONV (postoperative nausea and vomiting)   . Pneumonia     'a couple times; last time 05/2012" (09/05/2012)  . Exertional shortness of breath   . Type II diabetes mellitus   . Iron deficiency anemia 02/14/11     "I have taken shots for it; I take iron pills now"  . Spinal headache   .  Headache     "only related to my blood pressure" (09/05/2012)  . Arthritis     "right arm" (09/05/2012)  . Chronic lower back pain      Past Surgical History  Procedure Laterality Date  . Video assisted thoracoscopy (vats)/ lobectomy Left 04/2010  . Tubal ligation    . Abdominal hysterectomy  ~ 1986  . Laparoscopic salpingoopherectomy Bilateral post 1986  . Refractive surgery  ~ 2009  . Dilation and curettage of uterus    . Loop recorder implant  03/01/12    [employer]  . Coronary angioplasty with stent placement  05/2009     "had MI during this"  . Coronary angioplasty with stent placement  2010      Social History:  reports that she quit smoking about 14 years ago. Her smoking use included Cigarettes. She has a 3 pack-year smoking history. She has never used smokeless tobacco. She reports that she uses illicit drugs (Marijuana). She reports that she does not drink alcohol.   Allergies  Allergen Reactions  . Blue Dyes (Parenteral) Swelling       . Other     Cardiologist told patient not to take anything that affects "QT"  . Shrimp (Shellfish Allergy)     ONLY had a reaction once (face swelled up)---has been  able to eat shrimp and shellfish every other time   . Erythromycin Palpitations    Family History  Problem Relation Age of Onset  . Cancer       Prior to Admission medications   Medication Sig Start Date End Date Taking? Authorizing Provider  albuterol (PROVENTIL HFA;VENTOLIN HFA) 108 (90 BASE) MCG/ACT inhaler Inhale 2 puffs into the lungs every 6 (six) hours as needed for wheezing or shortness of breath.    Yes Historical Provider, MD  aspirin EC 81 MG tablet Take 81 mg by mouth daily.   Yes Historical Provider, MD  atorvastatin (LIPITOR) 10 MG tablet Take 1 tablet (10 mg total) by mouth daily at 6 PM. 03/03/12  Yes Nada Boozer, NP  Bimatoprost (LUMIGAN) 0.01 % SOLN Place 1 drop into both eyes at bedtime.    Yes Historical Provider, MD  brimonidine (ALPHAGAN) 0.15 %  ophthalmic solution Place 1 drop into both eyes 2 (two) times daily.    Yes Historical Provider, MD  Carboxymeth-Glycerin-Polysorb (REFRESH OPTIVE ADVANCED) 0.5-1-0.5 % SOLN Place 1 drop into both eyes 3 (three) times daily.   Yes Historical Provider, MD  clopidogrel (PLAVIX) 75 MG tablet Take 75 mg by mouth every morning.    Yes Historical Provider, MD  cyclobenzaprine (FLEXERIL) 5 MG tablet Take 5 mg by mouth 2 (two) times daily as needed for muscle spasms.    Yes Historical Provider, MD  ferrous sulfate 325 (65 FE) MG tablet Take 325 mg by mouth 2 (two) times daily.     Yes Historical Provider, MD  Fluticasone-Salmeterol (ADVAIR) 100-50 MCG/DOSE AEPB Inhale 1 puff into the lungs every 12 (twelve) hours.   Yes Historical Provider, MD  furosemide (LASIX) 40 MG tablet Take 1 tablet (40 mg total) by mouth daily. 03/03/12  Yes Nada Boozer, NP  hydrALAZINE (APRESOLINE) 50 MG tablet Take 1 tablet (50 mg total) by mouth every 8 (eight) hours. 03/03/12  Yes Nada Boozer, NP  insulin glargine (LANTUS) 100 UNIT/ML injection Inject 50 Units into the skin daily after breakfast.    Yes Historical Provider, MD  insulin lispro (HUMALOG) 100 UNIT/ML injection Inject 2-14 Units into the skin 3 (three) times daily between meals as needed. sliding scale-150-175=2 units, 176-200=4 units, 201-225=6 units, 226-250=8 units, 251-300=10 units, 301-350=12 units & >350=14 units & contact doctor   Yes Historical Provider, MD  ipratropium-albuterol (DUONEB) 0.5-2.5 (3) MG/3ML SOLN Take 3 mLs by nebulization 3 (three) times daily.    Yes Historical Provider, MD  isosorbide mononitrate (IMDUR) 60 MG 24 hr tablet Take 1 tablet (60 mg total) by mouth daily. 03/03/12  Yes Nada Boozer, NP  levothyroxine (SYNTHROID, LEVOTHROID) 100 MCG tablet Take 100 mcg by mouth daily before breakfast.   Yes Historical Provider, MD  magnesium 30 MG tablet Take 30 mg by mouth 2 (two) times daily.   Yes Historical Provider, MD  metolazone (ZAROXOLYN)  5 MG tablet Take 5 mg by mouth daily as needed. Only takes for a weight gain of 5lbs   Yes Historical Provider, MD  nebivolol (BYSTOLIC) 5 MG tablet Take 1 tablet (5 mg total) by mouth daily. 03/03/12  Yes Nada Boozer, NP  omeprazole (PRILOSEC) 20 MG capsule Take 20 mg by mouth every morning.    Yes Historical Provider, MD  potassium chloride SA (K-DUR,KLOR-CON) 20 MEQ tablet Take 1 tablet (20 mEq total) by mouth 2 (two) times daily. 03/03/12  Yes Nada Boozer, NP  senna (SENOKOT) 8.6 MG TABS Take 1 tablet by mouth.  Yes Historical Provider, MD  nitroGLYCERIN (NITROSTAT) 0.4 MG SL tablet Place 0.4 mg under the tongue every 5 (five) minutes as needed for chest pain.     Historical Provider, MD     Physical Exam: Filed Vitals:   10/03/12 0615 10/03/12 0630 10/03/12 0645 10/03/12 0700  BP: 193/64 166/86 196/67 199/91  Pulse: 60 52 52 59  Temp:      TempSrc:      Resp: 22 19 16 19   SpO2: 99% 99% 99% 100%     Constitutional: Vital signs reviewed. Patient is a well-developed and well-nourished in no acute distress and cooperative with exam. Alert and oriented x3.  Head: Normocephalic and atraumatic  Ear: TM normal bilaterally  Mouth: no erythema or exudates, MMM  Eyes: PERRL, EOMI, conjunctivae normal, No scleral icterus.  Neck: Supple, Trachea midline normal ROM, No JVD, mass, thyromegaly, or carotid bruit present.  Cardiovascular: RRR, S1 normal, S2 normal, no MRG, pulses symmetric and intact bilaterally  Pulmonary/Chest: CTAB, no wheezes, rales, or rhonchi  Abdominal: Soft. Non-tender, non-distended, bowel sounds are normal, no masses, organomegaly, or guarding present.  GU: no CVA tenderness Musculoskeletal: No joint deformities, erythema, or stiffness, ROM full and no nontender Ext: no edema and no cyanosis, pulses palpable bilaterally (DP and PT)  Hematology: no cervical, inginal, or axillary adenopathy.  Neurological: A&O x3, Strenght is normal and symmetric bilaterally, cranial  nerve II-XII are grossly intact, no focal motor deficit, sensory intact to light touch bilaterally.  Skin: Warm, dry and intact. No rash, cyanosis, or clubbing.  Psychiatric: Normal mood and affect. speech and behavior is normal. Judgment and thought content normal. Cognition and memory are normal.       Labs on Admission:    Basic Metabolic Panel:  Recent Labs Lab 10/02/12 2034 10/02/12 2352  NA 141  --   K 3.5  --   CL 109  --   CO2 22  --   GLUCOSE 154*  --   BUN 24*  --   CREATININE 1.60*  --   CALCIUM 9.2  --   MG  --  1.9   Liver Function Tests:  Recent Labs Lab 10/02/12 2034  AST 20  ALT 18  ALKPHOS 109  BILITOT 0.3  PROT 6.5  ALBUMIN 3.1*   No results found for this basename: LIPASE, AMYLASE,  in the last 168 hours  Recent Labs Lab 10/02/12 2352  AMMONIA 41   CBC:  Recent Labs Lab 10/02/12 2034  WBC 6.1  HGB 11.9*  HCT 35.2*  MCV 87.1  PLT 181   Cardiac Enzymes: No results found for this basename: CKTOTAL, CKMB, CKMBINDEX, TROPONINI,  in the last 168 hours  BNP (last 3 results)  Recent Labs  07/24/12 1854 09/04/12 1447 10/03/12 0026  PROBNP 2173.0* 8454.0* 9885.0*      CBG:  Recent Labs Lab 10/02/12 2046  GLUCAP 137*    Radiological Exams on Admission: Ct Head Wo Contrast  10/03/2012   *RADIOLOGY REPORT*  Clinical Data: Altered mental status.  CT HEAD WITHOUT CONTRAST  Technique:  Contiguous axial images were obtained from the base of the skull through the vertex without contrast.  Comparison: 09/04/2012  Findings: There is atrophy and chronic small vessel disease changes. No acute intracranial abnormality.  Specifically, no hemorrhage, hydrocephalus, mass lesion, acute infarction, or significant intracranial injury.  No acute calvarial abnormality. Visualized paranasal sinuses and mastoids clear.  Orbital soft tissues unremarkable.  IMPRESSION: No acute intracranial abnormality.  Atrophy, chronic  microvascular disease.    Original Report Authenticated By: Charlett Nose, M.D.   Dg Chest Port 1 View  10/03/2012   *RADIOLOGY REPORT*  Clinical Data: Altered mental status, shortness of breath.  PORTABLE CHEST - 1 VIEW  Comparison: 09/04/2012  Findings: Cardiomegaly with vascular congestion.  Possible early/slight interstitial edema.  No confluent opacities or effusions.  Stable slight elevation of the left hemidiaphragm.  No acute bony abnormality.  IMPRESSION: Cardiomegaly with vascular congestion.  Question early interstitial edema.   Original Report Authenticated By: Charlett Nose, M.D.    EKG: Independently reviewed.   Assessment/Plan Active Problems:   * No active hospital problems. *  Altered mental status We'll obtain an MRI to rule out a stroke EEG Neurology consultation Lumbar puncture for the low suspicion for meningitis/encephalitis  CHF exacerbation History of diastolic heart failure Probably in the setting of hypertensive urgency the patient has not received any of her home medications  Hypertensive urgency Blood pressure greater than 200 systolic Patient has received labetalol Admit to step down for this reason   Hypothyroidism Allergies Synthroid TSH  Diabetes start the patient on sliding scale insulin and Lantus  Code Status:   full Family Communication: bedside Disposition Plan: admit   Time spent: 70 mins   [facility] Pager [phone]  If 7PM-7AM, please contact night-coverage [email or web address] Password [facility] 10/03/2012, 7:37 AM

## 2012-10-03 NOTE — Procedures (Signed)
ELECTROENCEPHALOGRAM REPORT   Patient: ANJULIE DIPIERRO       Room #: 1610 EEG No. ID: [id number] Age: 67 y.o.        Sex: female Referring Physician: Dr. Nehemiah Settle Report Date:  10/03/2012        Interpreting Physician: Aline Brochure  History: DEMIKA LANGENDERFER is an 67 y.o. female presenting with confusion and markedly elevated blood pressure.    Indications for study:  Assess severity of probable hypertensive encephalopathy, as well as to rule out possible partial seizure activity.  Technique: This is an 18 channel routine scalp EEG performed at the bedside with bipolar and monopolar montages arranged in accordance to the international 10/20 system of electrode placement.   Description: EEG study was performed during wakefulness. Background activity consisted of 1-2 Hz irregular moderate amplitude diffuse continuous delta activity along with largely superimposed 6-7 Hz diffuse activity. Photic stimulation was not performed. Hyperventilation was not performed. No epileptiform discharges were recorded. There were no areas of disproportionate focal slowing.  Interpretation: This EEG is abnormal with moderately severe nonspecific generalized continuous slowing of cerebral activity. This pattern of slowing can be seen with metabolic as well as degenerative central nervous system disorders. No evidence of epileptic activity was seen.   Venetia Maxon M.D. [facility] [phone]

## 2012-10-03 NOTE — Care Management Note (Signed)
    Page 1 of 1   10/03/2012     11:32:46 AM   CARE MANAGEMENT NOTE 10/03/2012  Patient:  ALASK[place], FLETT   Account Number:  [id number]  Date Initiated:  10/03/2012  Documentation initiated by:  Junius Creamer  Subjective/Objective Assessment:   adm w htn     Action/Plan:   lives w sister, pcp dr Windy Fast polite   Anticipated DC Date:     Anticipated DC Plan:  HOME W HOME HEALTH SERVICES      DC Planning Services  CM consult      [facility]  Resumption Of Svcs/PTA Provider   Choice offered to / List presented to:          [facility] arranged  HH-1 RN      [facility] agency  [facility]   Status of service:   Medicare Important Message given?   (If response is "NO", the following Medicare IM given date fields will be blank) Date Medicare IM given:   Date Additional Medicare IM given:    Discharge Disposition:  HOME W HOME HEALTH SERVICES  Per UR Regulation:  Reviewed for med. necessity/level of care/duration of stay  If discussed at Long Length of Stay Meetings, dates discussed:    Comments:  6/26 1132 debbie Ronna Herskowitz rn,bsn have alerted donna w [facility] of adm.

## 2012-10-03 NOTE — ED Notes (Signed)
Attempted to explain the LP tp the patient without success.  Patient able to answer 2 questions but no more.  Moves all ext well

## 2012-10-03 NOTE — Consult Note (Signed)
PULMONARY  / CRITICAL CARE MEDICINE  Name: Wanda [name] MRN: [id number] DOB: 1945/05/11    ADMISSION DATE:  10/02/2012 CONSULTATION DATE:  10/03/12  REFERRING MD :  Susie Cassette PRIMARY SERVICE: Internal Medicine  CHIEF COMPLAINT:  AMS  BRIEF PATIENT DESCRIPTION: 67 year old female admitted 6/25 with AMS and hypertensive emergency.   SIGNIFICANT EVENTS / STUDIES:  6/25 Admit with AMS & HTN 6/25 CT Head>>>No acute intracranial abnormality 6/26 LP>>  LINES / TUBES: 6/25 Foley>>>  CULTURES: 6/26 CSF>>> 6/26 blood x2>>>  ANTIBIOTICS:   HISTORY OF PRESENT ILLNESS:  Wanda [name] is a 67 year old female with a history of poorly controlled hypertension who was admitted from the ED on 6/25. As of 6/20 the patient was noted to be appropriate and oriented, however she has had a decline in mental status over the week prior to her arrival to the ED.  She was noted to be hypertensive in the ED with systolic BP greater than 200. Her hypertension was controlled initially with labetolol IV push. A non-contrast head CT was negative for bleeding or CVA.   She was admitted to stepdown for hypertensive urgency and AMS.  We are consulted for management of her hypertension & to opine about need for cardene drip/ ICU bed  PAST MEDICAL HISTORY :  Past Medical History  Diagnosis Date  . Palpitations   . HTN (hypertension)   . CAD (coronary artery disease)   . Bigeminy   . Obesity   . Adverse effect of anesthesia 05/2009    MI during cardiac  stent placement  . Angina   . Myocardial infarction 05/2009  . Heart murmur   . Asthma   . Hypothyroidism     "started med 01/2011"  . Blood transfusion     "not related to OR" (09/05/2012)  . CHF (congestive heart failure)   . Recurrent upper respiratory infection (URI)     "colds every year"  . Hiatal hernia   . Depression      "lost son 03/2010"  . GERD (gastroesophageal reflux disease)   . 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 Atrial fibrillation   . Chronic kidney disease (CKD), stage III (moderate)     Hattie Perch 09/04/2012 (09/04/2012)  . PONV (postoperative nausea and vomiting)   . Pneumonia     'a couple times; last time 05/2012" (09/05/2012)  . Exertional shortness of breath   . Type II diabetes mellitus   . Iron deficiency anemia 02/14/11     "I have taken shots for it; I take iron pills now"  . Spinal headache   . Headache     "only related to my blood pressure" (09/05/2012)  . Arthritis     "right arm" (09/05/2012)  . Chronic lower back pain    Past Surgical History  Procedure Laterality Date  . Video assisted thoracoscopy (vats)/ lobectomy Left 04/2010  . Tubal ligation    . Abdominal hysterectomy  ~ 1986  . Laparoscopic salpingoopherectomy Bilateral post 1986  . Refractive surgery  ~ 2009  . Dilation and curettage of uterus    . Loop recorder implant  03/01/12    [employer]  . Coronary angioplasty with stent placement  05/2009     "had MI during this"  . Coronary angioplasty with stent placement  2010   Prior to Admission medications   Medication Sig Start Date End Date Taking? Authorizing Provider  albuterol (PROVENTIL HFA;VENTOLIN HFA) 108 (90 BASE) MCG/ACT inhaler Inhale  2 puffs into the lungs every 6 (six) hours as needed for wheezing or shortness of breath.    Yes Historical Provider, MD  aspirin EC 81 MG tablet Take 81 mg by mouth daily.   Yes Historical Provider, MD  atorvastatin (LIPITOR) 10 MG tablet Take 1 tablet (10 mg total) by mouth daily at 6 PM. 03/03/12  Yes Nada Boozer, NP  Bimatoprost (LUMIGAN) 0.01 % SOLN Place 1 drop into both eyes at bedtime.    Yes Historical Provider, MD  brimonidine (ALPHAGAN) 0.15 % ophthalmic solution Place 1 drop into both eyes 2 (two) times daily.    Yes Historical Provider, MD  Carboxymeth-Glycerin-Polysorb (REFRESH OPTIVE ADVANCED) 0.5-1-0.5 % SOLN Place 1 drop into both eyes 3 (three) times daily.   Yes Historical Provider, MD  clopidogrel  (PLAVIX) 75 MG tablet Take 75 mg by mouth every morning.    Yes Historical Provider, MD  cyclobenzaprine (FLEXERIL) 5 MG tablet Take 5 mg by mouth 2 (two) times daily as needed for muscle spasms.    Yes Historical Provider, MD  ferrous sulfate 325 (65 FE) MG tablet Take 325 mg by mouth 2 (two) times daily.     Yes Historical Provider, MD  Fluticasone-Salmeterol (ADVAIR) 100-50 MCG/DOSE AEPB Inhale 1 puff into the lungs every 12 (twelve) hours.   Yes Historical Provider, MD  hydrALAZINE (APRESOLINE) 50 MG tablet Take 1 tablet (50 mg total) by mouth every 8 (eight) hours. 03/03/12  Yes Nada Boozer, NP  insulin glargine (LANTUS) 100 UNIT/ML injection Inject 50 Units into the skin daily after breakfast.    Yes Historical Provider, MD  insulin lispro (HUMALOG) 100 UNIT/ML injection Inject 2-14 Units into the skin 3 (three) times daily between meals as needed. sliding scale-150-175=2 units, 176-200=4 units, 201-225=6 units, 226-250=8 units, 251-300=10 units, 301-350=12 units & >350=14 units & contact doctor   Yes Historical Provider, MD  ipratropium-albuterol (DUONEB) 0.5-2.5 (3) MG/3ML SOLN Take 3 mLs by nebulization 3 (three) times daily.    Yes Historical Provider, MD  isosorbide mononitrate (IMDUR) 60 MG 24 hr tablet Take 1 tablet (60 mg total) by mouth daily. 03/03/12  Yes Nada Boozer, NP  levothyroxine (SYNTHROID, LEVOTHROID) 100 MCG tablet Take 100 mcg by mouth daily before breakfast.   Yes Historical Provider, MD  magnesium 30 MG tablet Take 30 mg by mouth 2 (two) times daily.   Yes Historical Provider, MD  metolazone (ZAROXOLYN) 5 MG tablet Take 5 mg by mouth daily as needed. Only takes for a weight gain of 5lbs   Yes Historical Provider, MD  nebivolol (BYSTOLIC) 5 MG tablet Take 1 tablet (5 mg total) by mouth daily. 03/03/12  Yes Nada Boozer, NP  omeprazole (PRILOSEC) 20 MG capsule Take 20 mg by mouth every morning.    Yes Historical Provider, MD  potassium chloride SA (K-DUR,KLOR-CON) 20 MEQ  tablet Take 1 tablet (20 mEq total) by mouth 2 (two) times daily. 03/03/12  Yes Nada Boozer, NP  senna (SENOKOT) 8.6 MG TABS Take 1 tablet by mouth.   Yes Historical Provider, MD  furosemide (LASIX) 40 MG tablet Take 1 tablet (40 mg total) by mouth daily. 10/03/12   Mihai Croitoru, MD  nitroGLYCERIN (NITROSTAT) 0.4 MG SL tablet Place 0.4 mg under the tongue every 5 (five) minutes as needed for chest pain.     Historical Provider, MD   Allergies  Allergen Reactions  . Blue Dyes (Parenteral) Swelling       . Other     Cardiologist told  patient not to take anything that affects "QT"  . Shrimp (Shellfish Allergy)     ONLY had a reaction once (face swelled up)---has been able to eat shrimp and shellfish every other time   . Erythromycin Palpitations    FAMILY HISTORY:  Family History  Problem Relation Age of Onset  . Cancer     SOCIAL HISTORY:  reports that she quit smoking about 14 years ago. Her smoking use included Cigarettes. She has a 3 pack-year smoking history. She has never used smokeless tobacco. She reports that she uses illicit drugs (Marijuana). She reports that she does not drink alcohol.  REVIEW OF SYSTEMS:  Unable to obtain due to mental status  SUBJECTIVE: Reports no pain  VITAL SIGNS: Temp:  [98.1 F (36.7 C)-98.5 F (36.9 C)] 98.1 F (36.7 C) (06/26 1200) Pulse Rate:  [52-80] 72 (06/26 1315) Resp:  [14-32] 18 (06/26 1315) BP: (148-218)/(51-124) 183/61 mmHg (06/26 1315) SpO2:  [94 %-100 %] 100 % (06/26 1315) Weight:  [89.268 kg (196 lb 12.8 oz)] 89.268 kg (196 lb 12.8 oz) (06/26 1034)  HEMODYNAMICS:    VENTILATOR SETTINGS:    INTAKE / OUTPUT: Intake/Output     06/25 0701 - 06/26 0700 06/26 0701 - 06/27 0700   Urine (mL/kg/hr) 300 660 (1.1)   Total Output 300 660   Net -300 -660          PHYSICAL EXAMINATION: General:  Well nourished adult female, no apparent distress Neuro:  A&Ox2, cranial nerves II-XII grossly intact, PERRL, EOM intact, labile  mental status at times HEENT:  No scleral icterus, JVD present Cardiovascular:  RRR, S1 and S2, +2 DP and radial pulses, no peripheral edema Lungs:  CTA anterior lung fields Abdomen:  Round, soft, non-tender, active BS Musculoskeletal:  MAE well and equal. Muscle strength 5/5 Skin:  Pink, warm, dry, and intact  LABS:  Recent Labs Lab 10/02/12 2034 10/02/12 2352 10/03/12 0026 10/03/12 0809 10/03/12 0813  HGB 11.9*  --   --   --   --   WBC 6.1  --   --   --   --   PLT 181  --   --   --   --   NA 141  --   --   --   --   K 3.5  --   --   --   --   CL 109  --   --   --   --   CO2 22  --   --   --   --   GLUCOSE 154*  --   --   --   --   BUN 24*  --   --   --   --   CREATININE 1.60*  --   --   --   --   CALCIUM 9.2  --   --   --   --   MG  --  1.9  --   --   --   AST 20  --   --   --   --   ALT 18  --   --   --   --   ALKPHOS 109  --   --   --   --   BILITOT 0.3  --   --   --   --   PROT 6.5  --   --   --   --   ALBUMIN 3.1*  --   --   --   --  LATICACIDVEN  --  1.0  --   --   --   TROPONINI  --   --   --   --  <0.30  PROBNP  --   --  9885.0*  --   --   PHART  --   --   --  7.496*  --   PCO2ART  --   --   --  23.2*  --   PO2ART  --   --   --  57.0*  --     Recent Labs Lab 10/02/12 2046 10/03/12 1227  GLUCAP 137* 129*    CXR: 6/25 ? Interstitial pulmonary edema  ASSESSMENT / PLAN:  PULMONARY A:  Hypoxic Respiratory Failure secondary to pulmonary edema P:   - Oxygen to maintain SpO2>90% - Pulmonary toilet  CARDIOVASCULAR A:  Hypertensive Urgency most likely related to medication non-compliance ?CHF LVH present on EKG and JVD present on exam with possible pulmonary edema P:  - Labetolol IV push scheduled with HR parameters - Hydralazine IV push scheduled - PRN labetolol 10-20 & hydralazine 10-40 - Will restart home antihypertensive regimen in the morning if mental status improves - ECHO to evaluate heart function - Goal SBP 140-160 - F/U enzyme  trend  RENAL A:   CKD Stage III - stage III based on GFR, not formally diagnosed, overall trend of decline, baseline creatinine 1.4 P:   - Keep SBP 140-160 to maintain renal / neuro perfusion - Follow bmet - Trend I&O - Gentle diuresis with Lasix  GASTROINTESTINAL A:   No active Issue P:   - Pepcid for SUP - May resume diet when mental status improves  HEMATOLOGIC A:   No active Issues P:  - Follow CBC  INFECTIOUS A:   No active infectious source P:   - Follow WBC - F/U blood cultures and LP results (does not appear infected)  ENDOCRINE A:   History of hyperthyroidism Diabetes Mellitus   P:   - Continue synthroid - Follow up TSH - CBG - Continue glargine and SSI - Check A1c  NEUROLOGIC A:   Hypertensive Encephalopathy P:   - BP control as above - Follow up MRI results - Monitor clinically  TODAY'S SUMMARY: Mrs. Holte presents with hypertensive urgency and encephalopathy. Plan is to control her BP to a goal range of 140-160 with labetolol and hydralazine. If her mental status improves by morning we will resume her home BP regimen. Await ECHO  Will co-manage in SDU with [facility], no acute need to transfer to ICU.  [facility] [place] S-ACNP   Canary Brim, NP-C [facility] Pgr: [phone] or [phone]    I have personally obtained a history, examined the patient, evaluated laboratory and imaging results, formulated the assessment and plan and placed orders.   Elaysia Devargas V.   10/03/2012, 1:52 PM

## 2012-10-04 DIAGNOSIS — I369 Nonrheumatic tricuspid valve disorder, unspecified: Secondary | ICD-10-CM

## 2012-10-04 DIAGNOSIS — I5033 Acute on chronic diastolic (congestive) heart failure: Secondary | ICD-10-CM

## 2012-10-04 DIAGNOSIS — I509 Heart failure, unspecified: Secondary | ICD-10-CM

## 2012-10-04 LAB — BASIC METABOLIC PANEL
CO2: 21 mEq/L (ref 19–32)
Chloride: 113 mEq/L — ABNORMAL HIGH (ref 96–112)
GFR calc Af Amer: 34 mL/min — ABNORMAL LOW (ref >90)
Potassium: 3.6 mEq/L (ref 3.5–5.1)
Sodium: 145 mEq/L (ref 135–145)

## 2012-10-04 LAB — GLUCOSE, CAPILLARY: Glucose-Capillary: 153 mg/dL — ABNORMAL HIGH (ref 70–99)

## 2012-10-04 MED ORDER — HYDRALAZINE HCL 50 MG PO TABS
100.0000 mg | ORAL_TABLET | Freq: Three times a day (TID) | ORAL | Status: DC
  Administered 2012-10-04 – 2012-10-10 (×19): 100 mg via ORAL
  Filled 2012-10-04 (×22): qty 2

## 2012-10-04 MED ORDER — BIMATOPROST 0.01 % OP SOLN
1.0000 [drp] | Freq: Every day | OPHTHALMIC | Status: DC
  Administered 2012-10-04 – 2012-10-09 (×6): 1 [drp] via OPHTHALMIC
  Filled 2012-10-04: qty 2.5

## 2012-10-04 MED ORDER — NEBIVOLOL HCL 5 MG PO TABS
5.0000 mg | ORAL_TABLET | Freq: Every day | ORAL | Status: DC
  Filled 2012-10-04: qty 1

## 2012-10-04 MED ORDER — NEBIVOLOL HCL 10 MG PO TABS
10.0000 mg | ORAL_TABLET | Freq: Every day | ORAL | Status: DC
  Administered 2012-10-05 – 2012-10-10 (×6): 10 mg via ORAL
  Filled 2012-10-04 (×6): qty 1

## 2012-10-04 MED ORDER — MAGNESIUM 30 MG PO TABS: 30.0000 mg | ORAL_TABLET | Freq: Two times a day (BID) | ORAL | Status: DC

## 2012-10-04 MED ORDER — ISOSORBIDE MONONITRATE ER 60 MG PO TB24
60.0000 mg | ORAL_TABLET | Freq: Every day | ORAL | Status: DC
  Administered 2012-10-04 – 2012-10-10 (×7): 60 mg via ORAL
  Filled 2012-10-04 (×7): qty 1

## 2012-10-04 MED ORDER — ATORVASTATIN CALCIUM 10 MG PO TABS
10.0000 mg | ORAL_TABLET | Freq: Every day | ORAL | Status: DC
  Administered 2012-10-04 – 2012-10-08 (×5): 10 mg via ORAL
  Filled 2012-10-04 (×7): qty 1

## 2012-10-04 MED ORDER — PERFLUTREN LIPID MICROSPHERE
1.0000 mL | INTRAVENOUS | Status: AC | PRN
  Administered 2012-10-04: 2 mL via INTRAVENOUS
  Administered 2012-10-04: 1 mL via INTRAVENOUS
  Filled 2012-10-04: qty 10

## 2012-10-04 MED ORDER — LEVOTHYROXINE SODIUM 100 MCG PO TABS
100.0000 ug | ORAL_TABLET | Freq: Every day | ORAL | Status: DC
  Administered 2012-10-05 – 2012-10-10 (×6): 100 ug via ORAL
  Filled 2012-10-04 (×8): qty 1

## 2012-10-04 MED ORDER — MAGNESIUM CHLORIDE 64 MG PO TBEC
1.0000 | DELAYED_RELEASE_TABLET | Freq: Every day | ORAL | Status: DC
  Administered 2012-10-04 – 2012-10-09 (×6): 64 mg via ORAL
  Filled 2012-10-04 (×7): qty 1

## 2012-10-04 MED ORDER — FERROUS SULFATE 325 (65 FE) MG PO TABS
325.0000 mg | ORAL_TABLET | Freq: Two times a day (BID) | ORAL | Status: DC
  Administered 2012-10-04 – 2012-10-10 (×13): 325 mg via ORAL
  Filled 2012-10-04 (×14): qty 1

## 2012-10-04 MED ORDER — POTASSIUM CHLORIDE CRYS ER 20 MEQ PO TBCR
20.0000 meq | EXTENDED_RELEASE_TABLET | Freq: Two times a day (BID) | ORAL | Status: DC
  Administered 2012-10-04 – 2012-10-10 (×13): 20 meq via ORAL
  Filled 2012-10-04 (×15): qty 1

## 2012-10-04 MED ORDER — HYDRALAZINE HCL 50 MG PO TABS
50.0000 mg | ORAL_TABLET | Freq: Three times a day (TID) | ORAL | Status: DC
  Filled 2012-10-04 (×3): qty 1

## 2012-10-04 MED ORDER — CLOPIDOGREL BISULFATE 75 MG PO TABS
75.0000 mg | ORAL_TABLET | ORAL | Status: DC
  Administered 2012-10-05 – 2012-10-10 (×6): 75 mg via ORAL
  Filled 2012-10-04 (×7): qty 1

## 2012-10-04 MED ORDER — FUROSEMIDE 40 MG PO TABS
40.0000 mg | ORAL_TABLET | Freq: Every day | ORAL | Status: DC
  Administered 2012-10-05 – 2012-10-10 (×6): 40 mg via ORAL
  Filled 2012-10-04 (×7): qty 1

## 2012-10-04 NOTE — Progress Notes (Signed)
PULMONARY  / CRITICAL CARE MEDICINE  Name: Wanda [name] MRN: [id number] DOB: 07/11/1945    ADMISSION DATE:  10/02/2012 CONSULTATION DATE:  10/03/12  REFERRING MD :  Susie Cassette PRIMARY SERVICE: Internal Medicine  CHIEF COMPLAINT:  AMS  BRIEF PATIENT DESCRIPTION: 67 year old female admitted 6/25 with AMS and hypertensive emergency.   SIGNIFICANT EVENTS / STUDIES:  6/25 Admit with AMS & HTN 6/25 CT Head>>>No acute intracranial abnormality 6/26 LP>>  LINES / TUBES: 6/25 Foley>>>  CULTURES: 6/26 CSF>>> gram stain neg >>  6/26 blood x2>>>  ANTIBIOTICS:   HISTORY OF PRESENT ILLNESS:  Mrs. Caris is a 67 year old female with a history of poorly controlled hypertension who was admitted from the ED on 6/25. As of 6/20 the patient was noted to be appropriate and oriented, however she has had a decline in mental status over the week prior to her arrival to the ED.  She was noted to be hypertensive in the ED with systolic BP greater than 200. Her hypertension was controlled initially with labetolol IV push. A non-contrast head CT was negative for bleeding or CVA.   She was admitted to stepdown for hypertensive urgency and AMS.  We are consulted for management of her hypertension & to opine about need for cardene drip/ ICU bed   SUBJECTIVE:  BP improved. Lethargic, requires stim to wake up and interact  VITAL SIGNS: Temp:  [98.3 F (36.8 C)-99.1 F (37.3 C)] 98.3 F (36.8 C) (06/27 1208) Pulse Rate:  [58-76] 75 (06/27 1208) Resp:  [14-23] 15 (06/27 1208) BP: (152-203)/(44-76) 163/54 mmHg (06/27 1208) SpO2:  [97 %-100 %] 98 % (06/27 1208) Weight:  [87.3 kg (192 lb 7.4 oz)] 87.3 kg (192 lb 7.4 oz) (06/27 0021)  HEMODYNAMICS:    VENTILATOR SETTINGS:    INTAKE / OUTPUT: Intake/Output     06/26 0701 - 06/27 0700 06/27 0701 - 06/28 0700   I.V. (mL/kg) 153 (1.8) 80 (0.9)   Total Intake(mL/kg) 153 (1.8) 80 (0.9)   Urine (mL/kg/hr) 2050 (1)    Total Output 2050     Net -1897  +80          PHYSICAL EXAMINATION: General:  Well nourished adult female, sleeping Neuro:  A&Ox2, cranial nerves II-XII grossly intact, PERRL, EOM intact, labile mental status at times HEENT:  No scleral icterus, JVD present Cardiovascular:  RRR, S1 and S2, +2 DP and radial pulses, no peripheral edema Lungs:  CTA anterior lung fields Abdomen:  Round, soft, non-tender, active BS Musculoskeletal:  MAE well and equal. Muscle strength 5/5 Skin:  Pink, warm, dry, and intact  LABS:  Recent Labs Lab 10/02/12 2034 10/02/12 2352 10/03/12 0026 10/03/12 0809 10/03/12 0813 10/03/12 1405 10/03/12 2001 10/04/12 0918  HGB 11.9*  --   --   --   --   --   --   --   WBC 6.1  --   --   --   --   --   --   --   PLT 181  --   --   --   --   --   --   --   NA 141  --   --   --   --   --   --  145  K 3.5  --   --   --   --   --   --  3.6  CL 109  --   --   --   --   --   --  113*  CO2 22  --   --   --   --   --   --  21  GLUCOSE 154*  --   --   --   --   --   --  136*  BUN 24*  --   --   --   --   --   --  20  CREATININE 1.60*  --   --   --   --   --   --  1.72*  CALCIUM 9.2  --   --   --   --   --   --  8.8  MG  --  1.9  --   --   --   --   --   --   AST 20  --   --   --   --   --   --   --   ALT 18  --   --   --   --   --   --   --   ALKPHOS 109  --   --   --   --   --   --   --   BILITOT 0.3  --   --   --   --   --   --   --   PROT 6.5  --   --   --   --   --   --   --   ALBUMIN 3.1*  --   --   --   --   --   --   --   LATICACIDVEN  --  1.0  --   --   --   --   --   --   TROPONINI  --   --   --   --  <0.30 <0.30 <0.30  --   PROBNP  --   --  9885.0*  --   --   --   --   --   PHART  --   --   --  7.496*  --   --   --   --   PCO2ART  --   --   --  23.2*  --   --   --   --   PO2ART  --   --   --  57.0*  --   --   --   --     Recent Labs Lab 10/02/12 2046 10/03/12 1227 10/03/12 1711 10/03/12 2118 10/04/12 1222  GLUCAP 137* 129* 120* 105* 153*    CXR: 6/25 ? Interstitial pulmonary  edema  ASSESSMENT / PLAN:  PULMONARY A:  Hypoxic Respiratory Failure secondary to pulmonary edema P:   - Oxygen to maintain SpO2>90% - Pulmonary toilet  CARDIOVASCULAR A:  Hypertensive Urgency most likely related to medication non-compliance, improved 6/27 ?CHF LVH present on EKG and JVD present on exam with possible pulmonary edema P:  - Labetolol IV push scheduled with HR parameters - Hydralazine IV push scheduled - PRN labetolol 10-20 & hydralazine 10-40 - Would restart home antihypertensive regimen when MS improved - Goal SBP 140-160 - F/U enzyme trend  RENAL A:   CKD Stage III - stage III based on GFR, not formally diagnosed, overall trend of decline, baseline creatinine 1.4 P:   - Keep SBP 140-160 to maintain renal / neuro perfusion - Follow bmet - Trend I&O - Gentle diuresis with Lasix  GASTROINTESTINAL A:   No active  Issue P:   - Pepcid for SUP - Resume diet when mental status improves  HEMATOLOGIC A:   No active Issues P:  - Follow CBC  INFECTIOUS A:   No active infectious source P:   - Follow WBC - F/U blood cultures and LP results (does not appear infected)  ENDOCRINE A:   History of hyperthyroidism Diabetes Mellitus   P:   - Continue synthroid - Follow up TSH - CBG - Continue glargine and SSI - Check A1c  NEUROLOGIC A:   Hypertensive Encephalopathy P:   - BP control as above - Follow up MRI results - Monitor clinically  TODAY'S SUMMARY: Mrs. Paone presents with hypertensive urgency and encephalopathy. Plan is to control her BP to a goal range of 140-160 with labetolol and hydralazine. PCCM will sign off. Please call if we can assist  I have personally obtained a history, examined the patient, evaluated laboratory and imaging results, formulated the assessment and plan and placed orders.   Levy Pupa, MD, PhD 10/04/2012, 12:46 PM [facility] [phone] or if no answer [phone]

## 2012-10-04 NOTE — Progress Notes (Signed)
Subjective: No complaints. No overnight adverse clinical events reported.  Objective: Current vital signs: BP 178/66  Pulse 63  Temp(Src) 98.8 F (37.1 C) (Oral)  Resp 20  Ht 5\' 1"  (1.549 m)  Wt 87.3 kg (192 lb 7.4 oz)  BMI 36.38 kg/m2  SpO2 97%  Neurologic Exam: Patient was easily arousable and markedly confused. She was in no acute distress. She was oriented to place but disoriented to time including correct month as well as her age. No receptive or expressive aphasia. Patient tended to perseverate with verbal responses. Pupils were equal and reacted normally to light. Extraocular movements were full and conjugate. No facial weakness was noted. Extremity strength was normal and equal throughout. Muscle tone was normal throughout. Patient was too confused to adequately assess coordination.  Lab Results: Results for orders placed during the hospital encounter of 10/02/12 (from the past 48 hour(s))  CBC     Status: Abnormal   Collection Time    10/02/12  8:34 PM      Result Value Range   WBC 6.1  4.0 - 10.5 K/uL   RBC 4.04  3.87 - 5.11 MIL/uL   Hemoglobin 11.9 (*) 12.0 - 15.0 g/dL   HCT 40.9 (*) 81.1 - 91.4 %   MCV 87.1  78.0 - 100.0 fL   MCH 29.5  26.0 - 34.0 pg   MCHC 33.8  30.0 - 36.0 g/dL   RDW 78.2  95.6 - 21.3 %   Platelets 181  150 - 400 K/uL  COMPREHENSIVE METABOLIC PANEL     Status: Abnormal   Collection Time    10/02/12  8:34 PM      Result Value Range   Sodium 141  135 - 145 mEq/L   Potassium 3.5  3.5 - 5.1 mEq/L   Chloride 109  96 - 112 mEq/L   CO2 22  19 - 32 mEq/L   Glucose, Bld 154 (*) 70 - 99 mg/dL   BUN 24 (*) 6 - 23 mg/dL   Creatinine, Ser 0.86 (*) 0.50 - 1.10 mg/dL   Calcium 9.2  8.4 - 57.8 mg/dL   Total Protein 6.5  6.0 - 8.3 g/dL   Albumin 3.1 (*) 3.5 - 5.2 g/dL   AST 20  0 - 37 U/L   ALT 18  0 - 35 U/L   Alkaline Phosphatase 109  39 - 117 U/L   Total Bilirubin 0.3  0.3 - 1.2 mg/dL   GFR calc non Af Amer 33 (*) >90 mL/min   GFR calc Af  Amer 38 (*) >90 mL/min   Comment:            The eGFR has been calculated     using the CKD EPI equation.     This calculation has not been     validated in all clinical     situations.     eGFR's persistently     <90 mL/min signify     possible Chronic Kidney Disease.  GLUCOSE, CAPILLARY     Status: Abnormal   Collection Time    10/02/12  8:46 PM      Result Value Range   Glucose-Capillary 137 (*) 70 - 99 mg/dL  AMMONIA     Status: None   Collection Time    10/02/12 11:52 PM      Result Value Range   Ammonia 41  11 - 60 umol/L  LACTIC ACID, PLASMA     Status: None   Collection Time  10/02/12 11:52 PM      Result Value Range   Lactic Acid, Venous 1.0  0.5 - 2.2 mmol/L  TSH     Status: None   Collection Time    10/02/12 11:52 PM      Result Value Range   TSH 2.685  0.350 - 4.500 uIU/mL  MAGNESIUM     Status: None   Collection Time    10/02/12 11:52 PM      Result Value Range   Magnesium 1.9  1.5 - 2.5 mg/dL  CULTURE, BLOOD (ROUTINE X 2)     Status: None   Collection Time    10/03/12 12:01 AM      Result Value Range   Specimen Description BLOOD LEFT ARM     Special Requests BOTTLES DRAWN AEROBIC AND ANAEROBIC 10CC     Culture  Setup Time 10/03/2012 10:24     Culture       Value:        BLOOD CULTURE RECEIVED NO GROWTH TO DATE CULTURE WILL BE HELD FOR 5 DAYS BEFORE ISSUING A FINAL NEGATIVE REPORT   Report Status PENDING    CULTURE, BLOOD (ROUTINE X 2)     Status: None   Collection Time    10/03/12 12:05 AM      Result Value Range   Specimen Description BLOOD LEFT HAND     Special Requests BOTTLES DRAWN AEROBIC ONLY 10CC     Culture  Setup Time 10/03/2012 10:23     Culture       Value:        BLOOD CULTURE RECEIVED NO GROWTH TO DATE CULTURE WILL BE HELD FOR 5 DAYS BEFORE ISSUING A FINAL NEGATIVE REPORT   Report Status PENDING    POCT I-STAT 3, BLOOD GAS (G3P V)     Status: Abnormal   Collection Time    10/03/12 12:13 AM      Result Value Range   pH, Ven 7.389  (*) 7.250 - 7.300   pCO2, Ven 32.0 (*) 45.0 - 50.0 mmHg   pO2, Ven 50.0 (*) 30.0 - 45.0 mmHg   Bicarbonate 19.3 (*) 20.0 - 24.0 mEq/L   TCO2 20  0 - 100 mmol/L   O2 Saturation 85.0     Acid-base deficit 5.0 (*) 0.0 - 2.0 mmol/L   Sample type VENOUS    PRO B NATRIURETIC PEPTIDE     Status: Abnormal   Collection Time    10/03/12 12:26 AM      Result Value Range   Pro B Natriuretic peptide (BNP) 9885.0 (*) 0 - 125 pg/mL  URINALYSIS, ROUTINE W REFLEX MICROSCOPIC     Status: Abnormal   Collection Time    10/03/12  3:58 AM      Result Value Range   Color, Urine YELLOW  YELLOW   APPearance CLEAR  CLEAR   Specific Gravity, Urine 1.011  1.005 - 1.030   pH 6.0  5.0 - 8.0   Glucose, UA NEGATIVE  NEGATIVE mg/dL   Hgb urine dipstick TRACE (*) NEGATIVE   Bilirubin Urine NEGATIVE  NEGATIVE   Ketones, ur NEGATIVE  NEGATIVE mg/dL   Protein, ur >409 (*) NEGATIVE mg/dL   Urobilinogen, UA 0.2  0.0 - 1.0 mg/dL   Nitrite NEGATIVE  NEGATIVE   Leukocytes, UA NEGATIVE  NEGATIVE  URINE RAPID DRUG SCREEN (HOSP PERFORMED)     Status: None   Collection Time    10/03/12  3:58 AM  Result Value Range   Opiates NONE DETECTED  NONE DETECTED   Cocaine NONE DETECTED  NONE DETECTED   Benzodiazepines NONE DETECTED  NONE DETECTED   Amphetamines NONE DETECTED  NONE DETECTED   Tetrahydrocannabinol NONE DETECTED  NONE DETECTED   Barbiturates NONE DETECTED  NONE DETECTED   Comment:            DRUG SCREEN FOR MEDICAL PURPOSES     ONLY.  IF CONFIRMATION IS NEEDED     FOR ANY PURPOSE, NOTIFY LAB     WITHIN 5 DAYS.                LOWEST DETECTABLE LIMITS     FOR URINE DRUG SCREEN     Drug Class       Cutoff (ng/mL)     Amphetamine      1000     Barbiturate      200     Benzodiazepine   200     Tricyclics       300     Opiates          300     Cocaine          300     THC              50  URINE MICROSCOPIC-ADD ON     Status: None   Collection Time    10/03/12  3:58 AM      Result Value Range   WBC, UA  0-2  <3 WBC/hpf   RBC / HPF 0-2  <3 RBC/hpf  TSH     Status: None   Collection Time    10/03/12  8:06 AM      Result Value Range   TSH 2.996  0.350 - 4.500 uIU/mL  POCT I-STAT 3, BLOOD GAS (G3+)     Status: Abnormal   Collection Time    10/03/12  8:09 AM      Result Value Range   pH, Arterial 7.496 (*) 7.350 - 7.450   pCO2 arterial 23.2 (*) 35.0 - 45.0 mmHg   pO2, Arterial 57.0 (*) 80.0 - 100.0 mmHg   Bicarbonate 17.9 (*) 20.0 - 24.0 mEq/L   TCO2 19  0 - 100 mmol/L   O2 Saturation 92.0     Acid-base deficit 4.0 (*) 0.0 - 2.0 mmol/L   Patient temperature 98.7 F     Collection site RADIAL, ALLEN'S TEST ACCEPTABLE     Drawn by Operator     Sample type ARTERIAL    TROPONIN I     Status: None   Collection Time    10/03/12  8:13 AM      Result Value Range   Troponin I <0.30  <0.30 ng/mL   Comment:            Due to the release kinetics of cTnI,     a negative result within the first hours     of the onset of symptoms does not rule out     myocardial infarction with certainty.     If myocardial infarction is still suspected,     repeat the test at appropriate intervals.  AMMONIA     Status: None   Collection Time    10/03/12  8:14 AM      Result Value Range   Ammonia 34  11 - 60 umol/L  CSF CELL COUNT WITH DIFFERENTIAL     Status: Abnormal   Collection Time  10/03/12  9:45 AM      Result Value Range   Tube # 1     Color, CSF COLORLESS  COLORLESS   Appearance, CSF CLEAR  CLEAR   Supernatant NOT INDICATED     RBC Count, CSF 2 (*) 0 /cu mm   WBC, CSF 2  0 - 5 /cu mm   Segmented Neutrophils-CSF RARE  0 - 6 %   Lymphs, CSF RARE  40 - 80 %   Monocyte-Macrophage-Spinal Fluid RARE  15 - 45 %   Eosinophils, CSF 0  0 - 1 %   Other Cells, CSF TOO FEW TO COUNT, SMEAR AVAILABLE FOR REVIEW    CSF CELL COUNT WITH DIFFERENTIAL     Status: Abnormal   Collection Time    10/03/12  9:45 AM      Result Value Range   Tube # 3     Color, CSF COLORLESS  COLORLESS   Appearance, CSF  CLEAR  CLEAR   Supernatant NOT INDICATED     RBC Count, CSF 1 (*) 0 /cu mm   WBC, CSF 1  0 - 5 /cu mm   Segmented Neutrophils-CSF RARE  0 - 6 %   Lymphs, CSF FEW  40 - 80 %   Monocyte-Macrophage-Spinal Fluid RARE  15 - 45 %   Eosinophils, CSF NONE SEEN  0 - 1 %  CSF CULTURE     Status: None   Collection Time    10/03/12  9:45 AM      Result Value Range   Specimen Description CSF     Special Requests 1.3ML FLUID     Gram Stain       Value: CYTOSPIN SLIDE WBC PRESENT, PREDOMINANTLY MONONUCLEAR     Performed at [facility]   Culture NO GROWTH 1 DAY     Report Status PENDING    GRAM STAIN     Status: None   Collection Time    10/03/12  9:45 AM      Result Value Range   Specimen Description CSF     Special Requests 1.3ML FLUID     Gram Stain       Value: CYTOSPIN PREP     WBC PRESENT, PREDOMINANTLY MONONUCLEAR     NO ORGANISMS SEEN   Report Status 10/03/2012 FINAL    GLUCOSE, CSF     Status: Abnormal   Collection Time    10/03/12  9:45 AM      Result Value Range   Glucose, CSF 77 (*) 43 - 76 mg/dL  PROTEIN, CSF     Status: None   Collection Time    10/03/12  9:45 AM      Result Value Range   Total  Protein, CSF 44  15 - 45 mg/dL  MRSA PCR SCREENING     Status: None   Collection Time    10/03/12 11:25 AM      Result Value Range   MRSA by PCR NEGATIVE  NEGATIVE   Comment:            The GeneXpert MRSA Assay (FDA     approved for NASAL specimens     only), is one component of a     comprehensive MRSA colonization     surveillance program. It is not     intended to diagnose MRSA     infection nor to guide or     monitor treatment for     MRSA infections.  URINALYSIS, ROUTINE W REFLEX MICROSCOPIC     Status: Abnormal   Collection Time    10/03/12 12:01 PM      Result Value Range   Color, Urine YELLOW  YELLOW   APPearance CLEAR  CLEAR   Specific Gravity, Urine 1.010  1.005 - 1.030   pH 6.0  5.0 - 8.0   Glucose, UA NEGATIVE  NEGATIVE mg/dL   Hgb urine  dipstick NEGATIVE  NEGATIVE   Bilirubin Urine NEGATIVE  NEGATIVE   Ketones, ur NEGATIVE  NEGATIVE mg/dL   Protein, ur >161 (*) NEGATIVE mg/dL   Urobilinogen, UA 0.2  0.0 - 1.0 mg/dL   Nitrite NEGATIVE  NEGATIVE   Leukocytes, UA NEGATIVE  NEGATIVE  URINE MICROSCOPIC-ADD ON     Status: None   Collection Time    10/03/12 12:01 PM      Result Value Range   Squamous Epithelial / LPF RARE  RARE   WBC, UA 0-2  <3 WBC/hpf   Bacteria, UA RARE  RARE  D-DIMER, QUANTITATIVE     Status: Abnormal   Collection Time    10/03/12 12:10 PM      Result Value Range   D-Dimer, Quant 0.73 (*) 0.00 - 0.48 ug/mL-FEU   Comment:            AT THE INHOUSE ESTABLISHED CUTOFF     VALUE OF 0.48 ug/mL FEU,     THIS ASSAY HAS BEEN DOCUMENTED     IN THE LITERATURE TO HAVE     A SENSITIVITY AND NEGATIVE     PREDICTIVE VALUE OF AT LEAST     98 TO 99%.  THE TEST RESULT     SHOULD BE CORRELATED WITH     AN ASSESSMENT OF THE CLINICAL     PROBABILITY OF DVT / VTE.  GLUCOSE, CAPILLARY     Status: Abnormal   Collection Time    10/03/12 12:27 PM      Result Value Range   Glucose-Capillary 129 (*) 70 - 99 mg/dL  TROPONIN I     Status: None   Collection Time    10/03/12  2:05 PM      Result Value Range   Troponin I <0.30  <0.30 ng/mL   Comment:            Due to the release kinetics of cTnI,     a negative result within the first hours     of the onset of symptoms does not rule out     myocardial infarction with certainty.     If myocardial infarction is still suspected,     repeat the test at appropriate intervals.  GLUCOSE, CAPILLARY     Status: Abnormal   Collection Time    10/03/12  5:11 PM      Result Value Range   Glucose-Capillary 120 (*) 70 - 99 mg/dL  TROPONIN I     Status: None   Collection Time    10/03/12  8:01 PM      Result Value Range   Troponin I <0.30  <0.30 ng/mL   Comment:            Due to the release kinetics of cTnI,     a negative result within the first hours     of the onset  of symptoms does not rule out     myocardial infarction with certainty.     If myocardial infarction is still suspected,     repeat the  test at appropriate intervals.  GLUCOSE, CAPILLARY     Status: Abnormal   Collection Time    10/03/12  9:18 PM      Result Value Range   Glucose-Capillary 105 (*) 70 - 99 mg/dL    Studies/Results: Ct Head Wo Contrast  10/03/2012   *RADIOLOGY REPORT*  Clinical Data: Altered mental status.  CT HEAD WITHOUT CONTRAST  Technique:  Contiguous axial images were obtained from the base of the skull through the vertex without contrast.  Comparison: 09/04/2012  Findings: There is atrophy and chronic small vessel disease changes. No acute intracranial abnormality.  Specifically, no hemorrhage, hydrocephalus, mass lesion, acute infarction, or significant intracranial injury.  No acute calvarial abnormality. Visualized paranasal sinuses and mastoids clear.  Orbital soft tissues unremarkable.  IMPRESSION: No acute intracranial abnormality.  Atrophy, chronic microvascular disease.   Original Report Authenticated By: Charlett Nose, M.D.   Dg Chest Port 1 View  10/03/2012   *RADIOLOGY REPORT*  Clinical Data: Altered mental status, shortness of breath.  PORTABLE CHEST - 1 VIEW  Comparison: 09/04/2012  Findings: Cardiomegaly with vascular congestion.  Possible early/slight interstitial edema.  No confluent opacities or effusions.  Stable slight elevation of the left hemidiaphragm.  No acute bony abnormality.  IMPRESSION: Cardiomegaly with vascular congestion.  Question early interstitial edema.   Original Report Authenticated By: Charlett Nose, M.D.   Dg Fluoro Guide Lumbar Puncture  10/03/2012   *RADIOLOGY REPORT*  Clinical Data:  Altered mental status.  DIAGNOSTIC LUMBAR PUNCTURE UNDER FLUOROSCOPIC GUIDANCE  Fluoroscopy time:  1 minute 28 seconds  Technique:  Time-out procedure was performed.  Informed consent was obtained from the patient prior to the procedure, including potential  complications of headache, allergy, CSF leak and pain.  With the patient prone, the lower back was prepped with Betadine. 1% Lidocaine was used for local anesthesia.  Lumbar puncture was initially attempted on the right at the L2-L3 level but was unsuccessful. Successful lumbar puncture was performed at the L3-L4 level on theright using a 21 gauge needle with return of clear colorless CSF with an opening pressure of 19 cm water measured prone through the needle.   8 ml of CSF were obtained for laboratory studies.  The patient tolerated the procedure well and there were no apparent complications.  IMPRESSION: Diagnostic lumbar puncture performed without any complications.   Original Report Authenticated By: Carey Bullocks, M.D.    Medications:  I have reviewed the patient's current medications. Scheduled: . aspirin  300 mg Rectal Daily  . brimonidine  1 drop Both Eyes BID  . furosemide  40 mg Intravenous Daily  . hydrALAZINE  10 mg Intravenous Q6H  . insulin aspart  0-15 Units Subcutaneous TID WC  . insulin glargine  30 Units Subcutaneous Daily  . labetalol  10 mg Intravenous Q6H  . levothyroxine  50 mcg Intravenous Daily  . LORazepam  2 mg Intravenous Once   Continuous: . sodium chloride 10 mL/hr at 10/03/12 1800  . niCARDipine     JWJ:[id number], acetaminophen, hydrALAZINE, labetalol, levalbuterol, ondansetron (ZOFRAN) IV, ondansetron  Assessment/Plan: Encephalopathic state likely related to markedly elevated blood pressure on presentation. CT scan was unremarkable with no acute CNS changes. MRI was recommended to rule out possible stroke. EEG showed moderate generalized nonspecific slowing, but was otherwise unremarkable. Laboratory studies showed no significant metabolic abnormality, nor signs of CNS infection.  Recommendations: MRI of the brain without contrast rule out acute stroke. No changes in management otherwise recommended.  We'll continue to follow  this patient with  you.  C.R. Roseanne Reno, MD [facility] [phone]  10/04/2012  9:01 AM

## 2012-10-04 NOTE — Progress Notes (Signed)
I have read Wanda [name]'s echocardiogram which shows preserved LV function, high LV filling pressure, severe pulmonary hypertension (in the setting of hypertensive emergency).  No new wall motion abnormalities. Definity contrast did not demonstrate any LV apical thrombus.  Hypertensive emergency is a cardiovascular problem which we manage frequently. We have followed Wanda [name] for on and off for years (as she travels back and forth from [place]), but were not notified of her admission to the hospital until I read her echo today.  She was also admitted for CHF (probably diastolic, due to hypertension) in May and we were not notified. She has numerous cardiac problems, including VT and a recently implanted loop recorder and as described in the notes, is non-compliant. Ultimately, if we are asked to follow-up with her regarding bp management, it would be helpful to have had [place] involved in her care during her acute hospitalizations.  There is some indication that the family desires to return her to [place] - please let us know what they desire to do after follow-up.  Chrystie Nose, MD, [facility] Attending Cardiologist [facility]

## 2012-10-04 NOTE — Progress Notes (Signed)
Pt most alert in AM. Increasingly difficult to arouse as day progressed. Poor intake. Pt eventually took all meds. HTN controlled.

## 2012-10-04 NOTE — Progress Notes (Addendum)
Subjective: Admission H&P reviewed, all consultants input greatly appreciated. Apparently patient admitted with 2 days of confusional state and marked elevation of blood pressure. 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 Patient has had issues with compliance with her medications in the past, this has been confirmed with the family. Patient initially appeared a little groggy on examination however with additional questioning she was back to her baseline level of function and moving all extremities.  Objective: Vital signs in last 24 hours: Temp:  [98.1 F (36.7 C)-99.1 F (37.3 C)] 98.8 F (37.1 C) (06/27 0756) Pulse Rate:  [56-76] 70 (06/27 1000) Resp:  [14-32] 20 (06/27 1000) BP: (152-203)/(44-84) 155/53 mmHg (06/27 1000) SpO2:  [97 %-100 %] 98 % (06/27 1000) Weight:  [87.3 kg (192 lb 7.4 oz)] 87.3 kg (192 lb 7.4 oz) (06/27 0021) Weight change:  Last BM Date:  (PTA)  Intake/Output from previous day: 06/26 0701 - 06/27 0700 In: 153 [I.V.:153] Out: 2050 [Urine:2050] Intake/Output this shift: Total I/O In: 60 [I.V.:60] Out: -   General appearance: slowed mentation and but improves with constant similar Resp: clear to auscultation bilaterally Cardio: regular rate and rhythm Extremities: extremities normal, atraumatic, no cyanosis or edema Neurologic: Grossly normal  Lab Results:  Results for orders placed during the hospital encounter of 10/02/12 (from the past 24 hour(s))  MRSA PCR SCREENING     Status: None   Collection Time    10/03/12 11:25 AM      Result Value Range   MRSA by PCR NEGATIVE  NEGATIVE  URINALYSIS, ROUTINE W REFLEX MICROSCOPIC     Status: Abnormal   Collection Time    10/03/12 12:01 PM      Result Value Range   Color, Urine YELLOW  YELLOW   APPearance CLEAR  CLEAR   Specific Gravity, Urine 1.010  1.005 - 1.030   pH 6.0  5.0 - 8.0   Glucose, UA NEGATIVE  NEGATIVE mg/dL   Hgb urine  dipstick NEGATIVE  NEGATIVE   Bilirubin Urine NEGATIVE  NEGATIVE   Ketones, ur NEGATIVE  NEGATIVE mg/dL   Protein, ur >454 (*) NEGATIVE mg/dL   Urobilinogen, UA 0.2  0.0 - 1.0 mg/dL   Nitrite NEGATIVE  NEGATIVE   Leukocytes, UA NEGATIVE  NEGATIVE  URINE MICROSCOPIC-ADD ON     Status: None   Collection Time    10/03/12 12:01 PM      Result Value Range   Squamous Epithelial / LPF RARE  RARE   WBC, UA 0-2  <3 WBC/hpf   Bacteria, UA RARE  RARE  D-DIMER, QUANTITATIVE     Status: Abnormal   Collection Time    10/03/12 12:10 PM      Result Value Range   D-Dimer, Quant 0.73 (*) 0.00 - 0.48 ug/mL-FEU  GLUCOSE, CAPILLARY     Status: Abnormal   Collection Time    10/03/12 12:27 PM      Result Value Range   Glucose-Capillary 129 (*) 70 - 99 mg/dL  TROPONIN I     Status: None   Collection Time    10/03/12  2:05 PM      Result Value Range   Troponin I <0.30  <0.30 ng/mL  GLUCOSE, CAPILLARY     Status: Abnormal   Collection Time    10/03/12  5:11 PM      Result Value Range   Glucose-Capillary 120 (*) 70 - 99 mg/dL  TROPONIN I  Status: None   Collection Time    10/03/12  8:01 PM      Result Value Range   Troponin I <0.30  <0.30 ng/mL  GLUCOSE, CAPILLARY     Status: Abnormal   Collection Time    10/03/12  9:18 PM      Result Value Range   Glucose-Capillary 105 (*) 70 - 99 mg/dL  BASIC METABOLIC PANEL     Status: Abnormal   Collection Time    10/04/12  9:18 AM      Result Value Range   Sodium 145  135 - 145 mEq/L   Potassium 3.6  3.5 - 5.1 mEq/L   Chloride 113 (*) 96 - 112 mEq/L   CO2 21  19 - 32 mEq/L   Glucose, Bld 136 (*) 70 - 99 mg/dL   BUN 20  6 - 23 mg/dL   Creatinine, Ser 1.61 (*) 0.50 - 1.10 mg/dL   Calcium 8.8  8.4 - 09.6 mg/dL   GFR calc non Af Amer 30 (*) >90 mL/min   GFR calc Af Amer 34 (*) >90 mL/min      Studies/Results: Ct Head Wo Contrast  10/03/2012   *RADIOLOGY REPORT*  Clinical Data: Altered mental status.  CT HEAD WITHOUT CONTRAST  Technique:   Contiguous axial images were obtained from the base of the skull through the vertex without contrast.  Comparison: 09/04/2012  Findings: There is atrophy and chronic small vessel disease changes. No acute intracranial abnormality.  Specifically, no hemorrhage, hydrocephalus, mass lesion, acute infarction, or significant intracranial injury.  No acute calvarial abnormality. Visualized paranasal sinuses and mastoids clear.  Orbital soft tissues unremarkable.  IMPRESSION: No acute intracranial abnormality.  Atrophy, chronic microvascular disease.   Original Report Authenticated By: Charlett Nose, M.D.   Dg Chest Port 1 View  10/03/2012   *RADIOLOGY REPORT*  Clinical Data: Altered mental status, shortness of breath.  PORTABLE CHEST - 1 VIEW  Comparison: 09/04/2012  Findings: Cardiomegaly with vascular congestion.  Possible early/slight interstitial edema.  No confluent opacities or effusions.  Stable slight elevation of the left hemidiaphragm.  No acute bony abnormality.  IMPRESSION: Cardiomegaly with vascular congestion.  Question early interstitial edema.   Original Report Authenticated By: Charlett Nose, M.D.   Dg Fluoro Guide Lumbar Puncture  10/03/2012   *RADIOLOGY REPORT*  Clinical Data:  Altered mental status.  DIAGNOSTIC LUMBAR PUNCTURE UNDER FLUOROSCOPIC GUIDANCE  Fluoroscopy time:  1 minute 28 seconds  Technique:  Time-out procedure was performed.  Informed consent was obtained from the patient prior to the procedure, including potential complications of headache, allergy, CSF leak and pain.  With the patient prone, the lower back was prepped with Betadine. 1% Lidocaine was used for local anesthesia.  Lumbar puncture was initially attempted on the right at the L2-L3 level but was unsuccessful. Successful lumbar puncture was performed at the L3-L4 level on theright using a 21 gauge needle with return of clear colorless CSF with an opening pressure of 19 cm water measured prone through the needle.   8 ml of  CSF were obtained for laboratory studies.  The patient tolerated the procedure well and there were no apparent complications.  IMPRESSION: Diagnostic lumbar puncture performed without any complications.   Original Report Authenticated By: Carey Bullocks, M.D.    Medications:  Prior to Admission:  Prescriptions prior to admission  Medication Sig Dispense Refill  . albuterol (PROVENTIL HFA;VENTOLIN HFA) 108 (90 BASE) MCG/ACT inhaler Inhale 2 puffs into the lungs every 6 (six)  hours as needed for wheezing or shortness of breath.       Marland Kitchen aspirin EC 81 MG tablet Take 81 mg by mouth daily.      Marland Kitchen atorvastatin (LIPITOR) 10 MG tablet Take 1 tablet (10 mg total) by mouth daily at 6 PM.  30 tablet  6  . Bimatoprost (LUMIGAN) 0.01 % SOLN Place 1 drop into both eyes at bedtime.       . brimonidine (ALPHAGAN) 0.15 % ophthalmic solution Place 1 drop into both eyes 2 (two) times daily.       . Carboxymeth-Glycerin-Polysorb (REFRESH OPTIVE ADVANCED) 0.5-1-0.5 % SOLN Place 1 drop into both eyes 3 (three) times daily.      . clopidogrel (PLAVIX) 75 MG tablet Take 75 mg by mouth every morning.       . cyclobenzaprine (FLEXERIL) 5 MG tablet Take 5 mg by mouth 2 (two) times daily as needed for muscle spasms.       . ferrous sulfate 325 (65 FE) MG tablet Take 325 mg by mouth 2 (two) times daily.        . Fluticasone-Salmeterol (ADVAIR) 100-50 MCG/DOSE AEPB Inhale 1 puff into the lungs every 12 (twelve) hours.      . hydrALAZINE (APRESOLINE) 50 MG tablet Take 1 tablet (50 mg total) by mouth every 8 (eight) hours.  90 tablet  6  . insulin glargine (LANTUS) 100 UNIT/ML injection Inject 50 Units into the skin daily after breakfast.       . insulin lispro (HUMALOG) 100 UNIT/ML injection Inject 2-14 Units into the skin 3 (three) times daily between meals as needed. sliding scale-150-175=2 units, 176-200=4 units, 201-225=6 units, 226-250=8 units, 251-300=10 units, 301-350=12 units & >350=14 units & contact doctor      .  ipratropium-albuterol (DUONEB) 0.5-2.5 (3) MG/3ML SOLN Take 3 mLs by nebulization 3 (three) times daily.       . isosorbide mononitrate (IMDUR) 60 MG 24 hr tablet Take 1 tablet (60 mg total) by mouth daily.  30 tablet  11  . levothyroxine (SYNTHROID, LEVOTHROID) 100 MCG tablet Take 100 mcg by mouth daily before breakfast.      . magnesium 30 MG tablet Take 30 mg by mouth 2 (two) times daily.      . metolazone (ZAROXOLYN) 5 MG tablet Take 5 mg by mouth daily as needed. Only takes for a weight gain of 5lbs      . nebivolol (BYSTOLIC) 5 MG tablet Take 1 tablet (5 mg total) by mouth daily.  30 tablet  6  . omeprazole (PRILOSEC) 20 MG capsule Take 20 mg by mouth every morning.       . potassium chloride SA (K-DUR,KLOR-CON) 20 MEQ tablet Take 1 tablet (20 mEq total) by mouth 2 (two) times daily.  60 tablet  6  . senna (SENOKOT) 8.6 MG TABS Take 1 tablet by mouth.      . furosemide (LASIX) 40 MG tablet Take 1 tablet (40 mg total) by mouth daily.  30 tablet  6  . nitroGLYCERIN (NITROSTAT) 0.4 MG SL tablet Place 0.4 mg under the tongue every 5 (five) minutes as needed for chest pain.        Scheduled: . aspirin  300 mg Rectal Daily  . brimonidine  1 drop Both Eyes BID  . furosemide  40 mg Intravenous Daily  . hydrALAZINE  10 mg Intravenous Q6H  . insulin aspart  0-15 Units Subcutaneous TID WC  . insulin glargine  30 Units Subcutaneous Daily  .  labetalol  10 mg Intravenous Q6H  . levothyroxine  50 mcg Intravenous Daily  . LORazepam  2 mg Intravenous Once   Continuous: . sodium chloride 10 mL/hr at 10/03/12 1800  . niCARDipine      Assessment/Plan: Mental status change felt to be hypertensive encephalopathy, continue current medication. Patient appears to be back to her baseline, start PT OT. 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 Hypertension improved resume oral meds Diabetes, last a1c 6.9 08/2012 Chronic kidney disease stage 3-4  LOS: 2 days   Tayten Bergdoll D 10/04/2012, 11:01 AM

## 2012-10-04 NOTE — Progress Notes (Signed)
  Echocardiogram 2D Echocardiogram has been performed.  Cathie Beams 10/04/2012, 9:48 AM

## 2012-10-05 DIAGNOSIS — I129 Hypertensive chronic kidney disease with stage 1 through stage 4 chronic kidney disease, or unspecified chronic kidney disease: Secondary | ICD-10-CM

## 2012-10-05 DIAGNOSIS — I2510 Atherosclerotic heart disease of native coronary artery without angina pectoris: Secondary | ICD-10-CM

## 2012-10-05 LAB — BASIC METABOLIC PANEL
BUN: 22 mg/dL (ref 6–23)
CO2: 23 mEq/L (ref 19–32)
Calcium: 8.7 mg/dL (ref 8.4–10.5)
GFR calc non Af Amer: 26 mL/min — ABNORMAL LOW (ref >90)
Glucose, Bld: 96 mg/dL (ref 70–99)
Potassium: 3.6 mEq/L (ref 3.5–5.1)
Sodium: 148 mEq/L — ABNORMAL HIGH (ref 135–145)

## 2012-10-05 LAB — GLUCOSE, CAPILLARY
Glucose-Capillary: 79 mg/dL (ref 70–99)
Glucose-Capillary: 116 mg/dL — ABNORMAL HIGH (ref 70–99)
Glucose-Capillary: 92 mg/dL (ref 70–99)

## 2012-10-05 MED ORDER — LOSARTAN POTASSIUM 25 MG PO TABS
25.0000 mg | ORAL_TABLET | Freq: Every day | ORAL | Status: DC
  Administered 2012-10-05 – 2012-10-10 (×6): 25 mg via ORAL
  Filled 2012-10-05 (×6): qty 1

## 2012-10-05 NOTE — Progress Notes (Signed)
Subjective: Patient had no complaints. Mental status has improved.   Objective: Current vital signs: BP 153/93  Pulse 70  Temp(Src) 98.6 F (37 C) (Oral)  Resp 15  Ht 5\' 1"  (1.549 m)  Wt 85.6 kg (188 lb 11.4 oz)  BMI 35.68 kg/m2  SpO2 100%  Neurologic Exam: Alert and in no acute distress. Patient sitting in a chair with family members present. Patient is well-oriented to time as well as place. Pupils and extraocular movements were normal. No facial weakness was noted. Speech was normal. Strength and muscle tone were normal throughout. Coordination was normal.   Lab Results: Results for orders placed during the hospital encounter of 10/02/12 (from the past 48 hour(s))  GLUCOSE, CAPILLARY     Status: Abnormal   Collection Time    10/03/12 12:27 PM      Result Value Range   Glucose-Capillary 129 (*) 70 - 99 mg/dL  TROPONIN I     Status: None   Collection Time    10/03/12  2:05 PM      Result Value Range   Troponin I <0.30  <0.30 ng/mL   Comment:            Due to the release kinetics of cTnI,     a negative result within the first hours     of the onset of symptoms does not rule out     myocardial infarction with certainty.     If myocardial infarction is still suspected,     repeat the test at appropriate intervals.  GLUCOSE, CAPILLARY     Status: Abnormal   Collection Time    10/03/12  5:11 PM      Result Value Range   Glucose-Capillary 120 (*) 70 - 99 mg/dL  TROPONIN I     Status: None   Collection Time    10/03/12  8:01 PM      Result Value Range   Troponin I <0.30  <0.30 ng/mL   Comment:            Due to the release kinetics of cTnI,     a negative result within the first hours     of the onset of symptoms does not rule out     myocardial infarction with certainty.     If myocardial infarction is still suspected,     repeat the test at appropriate intervals.  GLUCOSE, CAPILLARY     Status: Abnormal   Collection Time    10/03/12  9:18 PM      Result  Value Range   Glucose-Capillary 105 (*) 70 - 99 mg/dL  BASIC METABOLIC PANEL     Status: Abnormal   Collection Time    10/04/12  9:18 AM      Result Value Range   Sodium 145  135 - 145 mEq/L   Potassium 3.6  3.5 - 5.1 mEq/L   Chloride 113 (*) 96 - 112 mEq/L   CO2 21  19 - 32 mEq/L   Glucose, Bld 136 (*) 70 - 99 mg/dL   BUN 20  6 - 23 mg/dL   Creatinine, Ser 0.98 (*) 0.50 - 1.10 mg/dL   Calcium 8.8  8.4 - 11.9 mg/dL   GFR calc non Af Amer 30 (*) >90 mL/min   GFR calc Af Amer 34 (*) >90 mL/min   Comment:            The eGFR has been calculated     using the CKD  EPI equation.     This calculation has not been     validated in all clinical     situations.     eGFR's persistently     <90 mL/min signify     possible Chronic Kidney Disease.  GLUCOSE, CAPILLARY     Status: Abnormal   Collection Time    10/04/12 12:22 PM      Result Value Range   Glucose-Capillary 153 (*) 70 - 99 mg/dL   Comment 1 Notify RN    GLUCOSE, CAPILLARY     Status: Abnormal   Collection Time    10/04/12  4:37 PM      Result Value Range   Glucose-Capillary 107 (*) 70 - 99 mg/dL   Comment 1 Notify RN    GLUCOSE, CAPILLARY     Status: Abnormal   Collection Time    10/04/12  8:05 PM      Result Value Range   Glucose-Capillary 119 (*) 70 - 99 mg/dL  BASIC METABOLIC PANEL     Status: Abnormal   Collection Time    10/05/12  3:50 AM      Result Value Range   Sodium 148 (*) 135 - 145 mEq/L   Potassium 3.6  3.5 - 5.1 mEq/L   Chloride 115 (*) 96 - 112 mEq/L   CO2 23  19 - 32 mEq/L   Glucose, Bld 96  70 - 99 mg/dL   BUN 22  6 - 23 mg/dL   Creatinine, Ser 1.61 (*) 0.50 - 1.10 mg/dL   Calcium 8.7  8.4 - 09.6 mg/dL   GFR calc non Af Amer 26 (*) >90 mL/min   GFR calc Af Amer 30 (*) >90 mL/min   Comment:            The eGFR has been calculated     using the CKD EPI equation.     This calculation has not been     validated in all clinical     situations.     eGFR's persistently     <90 mL/min signify      possible Chronic Kidney Disease.  GLUCOSE, CAPILLARY     Status: None   Collection Time    10/05/12  8:30 AM      Result Value Range   Glucose-Capillary 79  70 - 99 mg/dL    Studies/Results: No results found.  Medications:  I have reviewed the patient's current medications. Scheduled: . atorvastatin  10 mg Oral q1800  . bimatoprost  1 drop Both Eyes QHS  . brimonidine  1 drop Both Eyes BID  . clopidogrel  75 mg Oral BH-q7a  . ferrous sulfate  325 mg Oral BID  . furosemide  40 mg Oral Daily  . hydrALAZINE  100 mg Oral Q8H  . insulin aspart  0-15 Units Subcutaneous TID WC  . insulin glargine  30 Units Subcutaneous Daily  . isosorbide mononitrate  60 mg Oral Daily  . levothyroxine  50 mcg Intravenous Daily  . levothyroxine  100 mcg Oral QAC breakfast  . LORazepam  2 mg Intravenous Once  . losartan  25 mg Oral Daily  . magnesium chloride  1 tablet Oral Daily  . nebivolol  10 mg Oral Daily  . potassium chloride SA  20 mEq Oral BID   Continuous: . sodium chloride 10 mL/hr at 10/03/12 1800   EAV:[id number], acetaminophen, hydrALAZINE, labetalol, levalbuterol, ondansetron (ZOFRAN) IV, ondansetron  Assessment/Plan: Altered mental status most likely secondary to hypertensive  encephalopathy, which is now resolved. Patient's mental status is back to baseline, per family members. Patient has no focal neurologic deficits.  No further neurological intervention is indicated. We will see this patient in followup on a when necessary basis.    C.R. Roseanne Reno, MD [facility] [phone]   10/05/2012  12:24 PM

## 2012-10-05 NOTE — Progress Notes (Signed)
Subjective: Mr.s MeQueen was admtted with mental status changes - neuro w/u without source and working diagnosis is hypertensive encephalopathy. Neurology, Cardiology and CCM have been involved in her care.  Mrs. Dulski is awake and alert: knows she is at [employer] [facility] she was admitted for mental status changes. She reports that she was taking her medications.  Objective: Lab:  Recent Labs  10/02/12 2034  WBC 6.1  HGB 11.9*  HCT 35.2*  MCV 87.1  PLT 181    Recent Labs  10/02/12 2034 10/02/12 2352 10/04/12 0918 10/05/12 0350  NA 141  --  145 148*  K 3.5  --  3.6 3.6  CL 109  --  113* 115*  GLUCOSE 154*  --  136* 96  BUN 24*  --  20 22  CREATININE 1.60*  --  1.72* 1.93*  CALCIUM 9.2  --  8.8 8.7  MG  --  1.9  --   --     Imaging: CT brain 6/27 : IMPRESSION:  No acute intracranial abnormality.  Atrophy, chronic microvascular disease.  Scheduled Meds: . atorvastatin  10 mg Oral q1800  . bimatoprost  1 drop Both Eyes QHS  . brimonidine  1 drop Both Eyes BID  . clopidogrel  75 mg Oral BH-q7a  . ferrous sulfate  325 mg Oral BID  . furosemide  40 mg Oral Daily  . hydrALAZINE  100 mg Oral Q8H  . insulin aspart  0-15 Units Subcutaneous TID WC  . insulin glargine  30 Units Subcutaneous Daily  . isosorbide mononitrate  60 mg Oral Daily  . levothyroxine  50 mcg Intravenous Daily  . levothyroxine  100 mcg Oral QAC breakfast  . LORazepam  2 mg Intravenous Once  . magnesium chloride  1 tablet Oral Daily  . nebivolol  10 mg Oral Daily  . potassium chloride SA  20 mEq Oral BID   Continuous Infusions: . sodium chloride 10 mL/hr at 10/03/12 1800  . niCARDipine     PRN Meds:.acetaminophen, acetaminophen, hydrALAZINE, labetalol, levalbuterol, ondansetron (ZOFRAN) IV, ondansetron   Physical Exam: Filed Vitals:   10/05/12 0800  BP:   Pulse:   Temp: 98.4 F (36.9 C)  Resp:    BP Readings from Last 3 Encounters:  10/05/12 155/65  09/05/12 136/63  07/24/12 162/110    Gen'l- mildly overweight AA woman in a chair in no distress HEENT- C&S clear, PERRLA, missing many teeth Cor- 2+ radial pulse, RRR Pulm - normal breath sounds, no rales or wheezes.  Neuro - Awake and alert, speech is clear, follows commands.     Assessment/Plan: 1. Neuro - patient seems to be back to baseline.   Plan Will not order MRI - will defer to neurology if this is now necessary  PT/OT  2. HTN- BP definitely improved.   Plan Will resume home medications: bystolic 10 mg qd, hydralazine increased to 100 mg tid, furosemide 40 mg daily. As DM with stable CKD IV will give her a trial of low dose ARB - losartan 25 mg daily  3. Cardiac - known CAD but no chest pain and normal telemetry. No sign of failure Cardiac Panel (last 3 results)  Recent Labs  10/03/12 0813 10/03/12 1405 10/03/12 2001  TROPONINI <0.30 <0.30 <0.30   2D echo 6/27 : Left ventricle: The cavity size was normal. There was moderate concentric hypertrophy. Definity contrast was administered which demonstrates coarse trabeculation at the apex without mural thrombus. Calcified apical false tendon. Systolic function was normal. The estimated  ejection fraction was in the range of 55% to 60%. Wall motion was normal; there were no regional wall motion abnormalities. Doppler parameters are consistent withpseudonormal left ventricular relaxation (grade2 diastolic dysfunction). The E/e' ratio is >20, suggesting high LV fillng pressure.  Plan Will continue telemetry and step down fo9r 1 day.   4. CKD IV - reviewed Cr back to 2013 - stable Creatinine.   Plan - trial of losartan with close f/u  5. DM   CBG (last 3)   Recent Labs  10/04/12 1637 10/04/12 2005 10/05/12 0830  GLUCAP 107* 119* 79   Plan Continue present meds and monitoring with SS     Illene Regulus [facility] (o) [phone]; (c) [phone] Call-grp - [facility]  Tele: [phone]  10/05/2012, 11:33 AM

## 2012-10-06 LAB — CSF CULTURE W GRAM STAIN

## 2012-10-06 LAB — GLUCOSE, CAPILLARY
Glucose-Capillary: 104 mg/dL — ABNORMAL HIGH (ref 70–99)
Glucose-Capillary: 78 mg/dL (ref 70–99)
Glucose-Capillary: 63 mg/dL — ABNORMAL LOW (ref 70–99)
Glucose-Capillary: 114 mg/dL — ABNORMAL HIGH (ref 70–99)

## 2012-10-06 LAB — BASIC METABOLIC PANEL
Calcium: 9.1 mg/dL (ref 8.4–10.5)
GFR calc non Af Amer: 24 mL/min — ABNORMAL LOW (ref >90)
Glucose, Bld: 71 mg/dL (ref 70–99)
Potassium: 4 mEq/L (ref 3.5–5.1)
Sodium: 144 mEq/L (ref 135–145)

## 2012-10-06 LAB — MICROALBUMIN / CREATININE URINE RATIO
Microalb Creat Ratio: 1476.1 mg/g — ABNORMAL HIGH (ref 0.0–30.0)
Microalb, Ur: 234.4 mg/dL — ABNORMAL HIGH (ref 0.00–1.89)

## 2012-10-06 NOTE — Evaluation (Signed)
Physical Therapy Evaluation Patient Details Name: Wanda [name] MRN: [id number] DOB: October 20, 1945 Today's Date: 10/06/2012 Time: 4098-1191 PT Time Calculation (min): 23 min  PT Assessment / Plan / Recommendation History of Present Illness  Patient is a 67 yo female admitted with hypertensive urgency and altered mental status.  Clinical Impression  Pt currently with functional limitations due to the deficits listed below (see PT Problem List).  Per family, patient's mental status at baseline. Pt will benefit from skilled PT to increase their independence and safety with mobility to allow discharge home.     PT Assessment  Patient needs continued PT services    Follow Up Recommendations  Home health PT;Supervision/Assistance - 24 hour    Does the patient have the potential to tolerate intense rehabilitation      Barriers to Discharge        Equipment Recommendations  Rolling walker with 5" wheels    Recommendations for Other Services OT consult (Needs ADL equipment)   Frequency Min 3X/week    Precautions / Restrictions Precautions Precautions: Fall Restrictions Weight Bearing Restrictions: No   Pertinent Vitals/Pain       Mobility  Bed Mobility Bed Mobility: Not assessed Transfers Transfers: Sit to Stand;Stand to Sit Sit to Stand: 4: Min assist;With upper extremity assist;With armrests;From chair/3-in-1 Stand to Sit: 4: Min assist;With upper extremity assist;With armrests;To chair/3-in-1 Details for Transfer Assistance: Verbal and tactile cues for hand placement.  Required physical assist to reach for armrests when sitting - difficulty following verbal commands. Ambulation/Gait Ambulation/Gait Assistance: 4: Min assist Ambulation Distance (Feet): 75 Feet Assistive device: Rolling walker Ambulation/Gait Assistance Details: Verbal cues for safe use of RW.  Patient required physical assist to maneuver RW to avoid obstacles and to make turn in hallway. Gait Pattern:  Step-through pattern;Decreased step length - right;Decreased step length - left;Trunk flexed Gait velocity: Slow gait speed    Exercises     PT Diagnosis: Difficulty walking;Generalized weakness;Altered mental status  PT Problem List: Decreased strength;Decreased activity tolerance;Decreased mobility;Decreased balance;Decreased cognition;Decreased knowledge of use of DME;Cardiopulmonary status limiting activity PT Treatment Interventions: DME instruction;Gait training;Functional mobility training;Therapeutic exercise;Cognitive remediation;Patient/family education     PT Goals(Current goals can be found in the care plan section) Acute Rehab PT Goals Patient Stated Goal: To be able to go home soon PT Goal Formulation: With patient Time For Goal Achievement: 10/13/12 Potential to Achieve Goals: Good  Visit Information  Last PT Received On: 10/06/12 Assistance Needed: +1 History of Present Illness: Patient is a 67 yo female admitted with hypertensive urgency and altered mental status.       Prior Functioning  Home Living Family/patient expects to be discharged to:: Private residence Living Arrangements: Other relatives (Sister) Available Help at Discharge: Family;Available 24 hours/day Type of Home: House Home Access: Level entry Home Layout: One level Home Equipment: Cane - quad Prior Function Level of Independence: Independent with assistive device(s) Communication Communication: No difficulties Dominant Hand: Right    Cognition  Cognition Arousal/Alertness: Awake/alert Behavior During Therapy: Flat affect Overall Cognitive Status: History of cognitive impairments - at baseline    Extremity/Trunk Assessment Upper Extremity Assessment Upper Extremity Assessment: Overall WFL for tasks assessed Lower Extremity Assessment Lower Extremity Assessment: Generalized weakness Cervical / Trunk Assessment Cervical / Trunk Assessment: Normal   Balance    End of Session PT - End  of Session Equipment Utilized During Treatment: Gait belt Activity Tolerance: Patient tolerated treatment well Patient left: in chair;with call bell/phone within reach Nurse Communication: Mobility status  GP     Vena Austria 10/06/2012, 2:18 PM Durenda Hurt. Renaldo Fiddler, [facility] Acute Rehab Services Pager [phone]

## 2012-10-06 NOTE — Progress Notes (Signed)
Subjective: Wanda [name] is feeling pretty good. No chest pain. Alert and oriented  Objective: Lab: No results found for this basename: WBC, NEUTROABS, HGB, HCT, MCV, PLT,  in the last 72 hours  Recent Labs  10/04/12 0918 10/05/12 0350 10/06/12 0620  NA 145 148* 144  K 3.6 3.6 4.0  CL 113* 115* 112  GLUCOSE 136* 96 71  BUN 20 22 24*  CREATININE 1.72* 1.93* 2.02*  CALCIUM 8.8 8.7 9.1    Imaging:  Scheduled Meds: . atorvastatin  10 mg Oral q1800  . bimatoprost  1 drop Both Eyes QHS  . brimonidine  1 drop Both Eyes BID  . clopidogrel  75 mg Oral BH-q7a  . ferrous sulfate  325 mg Oral BID  . furosemide  40 mg Oral Daily  . hydrALAZINE  100 mg Oral Q8H  . insulin aspart  0-15 Units Subcutaneous TID WC  . insulin glargine  30 Units Subcutaneous Daily  . isosorbide mononitrate  60 mg Oral Daily  . levothyroxine  100 mcg Oral QAC breakfast  . LORazepam  2 mg Intravenous Once  . losartan  25 mg Oral Daily  . magnesium chloride  1 tablet Oral Daily  . nebivolol  10 mg Oral Daily  . potassium chloride SA  20 mEq Oral BID   Continuous Infusions: . sodium chloride 10 mL/hr at 10/03/12 1800   PRN Meds:.acetaminophen, acetaminophen, hydrALAZINE, labetalol, levalbuterol, ondansetron (ZOFRAN) IV, ondansetron   Physical Exam: Filed Vitals:   10/06/12 1200  BP: 147/71  Pulse: 55  Temp:   Resp: 17    Intake/Output Summary (Last 24 hours) at 10/06/12 1236 Last data filed at 10/06/12 0900  Gross per 24 hour  Intake    750 ml  Output    825 ml  Net    -75 ml   Gen'l - WNWD overweight AA woman in no distress HEENT- C&S clear Cor- 2+ radial pulse, RRR, no JVD Pulm - normal respirations Neuro - A&O x 3, non-focal exam.      Assessment/Plan: 1. Neuro - encephalopathy resolved. No further recommendations from neurology  2. HTN -  BP Readings from Last 3 Encounters:  10/06/12 147/71  09/05/12 136/63  07/24/12 162/110   still running elevated systolic BP. Minimal  bump up in creatinine   Plan Continue present medications, including ARB  F/U Bmet in AM  3. Cardiac - stable  4. CKD IV - slight bump in Cr. Plan  Will continue ARB today  Repeat Bmet in AM - if continued rise in creatinine will need to stop ARB  5. DM -  CBG (last 3)   Recent Labs  10/05/12 1748 10/05/12 2137 10/06/12 0739  GLUCAP 116* 92 104*    Stable  Will transfer to med-surg. DC foley and be sure she can void. Dispo - home soon   [employer] [facility] (o) [phone]; (c) [phone] Call-grp - [facility]  Tele: [phone]  10/06/2012, 12:31 PM

## 2012-10-06 NOTE — Progress Notes (Addendum)
Pt OOB in chair. With coaching, participates in ADLs. Alert X 3. Poor appetite, despite numerous choices. No c/o pain. See flow sheet for improving B.P. Pt voided post foley d/c. Will need 2 hr toileting schedule.

## 2012-10-06 NOTE — Progress Notes (Signed)
Patient arrived via w/c from 2600, alert and oriented x3, skin dry and intact.  Oriented patient to 6700 unit, routine and safety plan.  Notified family and patient that she is in a camera room for her safety and will have a bed alarm on to alert staff if she tried to get up by herself.  Patient and daughter given the call bell and phone.

## 2012-10-07 LAB — URINALYSIS, ROUTINE W REFLEX MICROSCOPIC
Leukocytes, UA: NEGATIVE
Nitrite: NEGATIVE
Specific Gravity, Urine: 1.011 (ref 1.005–1.030)
Urobilinogen, UA: 0.2 mg/dL (ref 0.0–1.0)
pH: 7.5 (ref 5.0–8.0)

## 2012-10-07 LAB — GLUCOSE, CAPILLARY: Glucose-Capillary: 111 mg/dL — ABNORMAL HIGH (ref 70–99)

## 2012-10-07 LAB — BASIC METABOLIC PANEL
BUN: 27 mg/dL — ABNORMAL HIGH (ref 6–23)
Chloride: 109 mEq/L (ref 96–112)
GFR calc Af Amer: 32 mL/min — ABNORMAL LOW (ref >90)
Potassium: 4.1 mEq/L (ref 3.5–5.1)
Sodium: 140 mEq/L (ref 135–145)

## 2012-10-07 NOTE — Progress Notes (Addendum)
Subjective: No new problems overnight. I did receive a call from nursing with complaints the patient was pocketing food in her mouth.  Objective: Vital signs in last 24 hours: Temp:  [99.1 F (37.3 C)-99.4 F (37.4 C)] 99.3 F (37.4 C) (06/30 0935) Pulse Rate:  [57-64] 57 (06/30 0935) Resp:  [16-18] 18 (06/30 0935) BP: (152-179)/(57-106) 163/78 mmHg (06/30 0935) SpO2:  [97 %-99 %] 97 % (06/30 0935) Weight change:  Last BM Date: 10/02/12  Intake/Output from previous day: 06/29 0701 - 06/30 0700 In: 320 [P.O.:320] Out: 900 [Urine:900] Intake/Output this shift: Total I/O In: 50 [P.O.:50] Out: -   General appearance: a little groggy however with increased stimulation returns to her baseline slow mentation Resp: clear to auscultation bilaterally Cardio: regular rate and rhythm Extremities: extremities normal, atraumatic, no cyanosis or edema Neurologic: Motor: nonfocal  Lab Results:  Results for orders placed during the hospital encounter of 10/02/12 (from the past 24 hour(s))  GLUCOSE, CAPILLARY     Status: Abnormal   Collection Time    10/06/12  5:24 PM      Result Value Range   Glucose-Capillary 63 (*) 70 - 99 mg/dL   Comment 1 Notify RN    GLUCOSE, CAPILLARY     Status: Abnormal   Collection Time    10/06/12  7:36 PM      Result Value Range   Glucose-Capillary 114 (*) 70 - 99 mg/dL  GLUCOSE, CAPILLARY     Status: Abnormal   Collection Time    10/06/12  9:52 PM      Result Value Range   Glucose-Capillary 111 (*) 70 - 99 mg/dL  BASIC METABOLIC PANEL     Status: Abnormal   Collection Time    10/07/12  5:40 AM      Result Value Range   Sodium 140  135 - 145 mEq/L   Potassium 4.1  3.5 - 5.1 mEq/L   Chloride 109  96 - 112 mEq/L   CO2 23  19 - 32 mEq/L   Glucose, Bld 96  70 - 99 mg/dL   BUN 27 (*) 6 - 23 mg/dL   Creatinine, Ser 4.09 (*) 0.50 - 1.10 mg/dL   Calcium 9.2  8.4 - 81.1 mg/dL   GFR calc non Af Amer 27 (*) >90 mL/min   GFR calc Af Amer 32 (*) >90 mL/min   GLUCOSE, CAPILLARY     Status: Abnormal   Collection Time    10/07/12  7:27 AM      Result Value Range   Glucose-Capillary 120 (*) 70 - 99 mg/dL   Comment 1 Documented in Chart     Comment 2 Notify RN    GLUCOSE, CAPILLARY     Status: Abnormal   Collection Time    10/07/12 11:28 AM      Result Value Range   Glucose-Capillary 111 (*) 70 - 99 mg/dL   Comment 1 Documented in Chart     Comment 2 Notify RN        Studies/Results: No results found.  Medications:  Prior to Admission:  Prescriptions prior to admission  Medication Sig Dispense Refill  . albuterol (PROVENTIL HFA;VENTOLIN HFA) 108 (90 BASE) MCG/ACT inhaler Inhale 2 puffs into the lungs every 6 (six) hours as needed for wheezing or shortness of breath.       Marland Kitchen aspirin EC 81 MG tablet Take 81 mg by mouth daily.      Marland Kitchen atorvastatin (LIPITOR) 10 MG tablet Take 1 tablet (  10 mg total) by mouth daily at 6 PM.  30 tablet  6  . Bimatoprost (LUMIGAN) 0.01 % SOLN Place 1 drop into both eyes at bedtime.       . brimonidine (ALPHAGAN) 0.15 % ophthalmic solution Place 1 drop into both eyes 2 (two) times daily.       . Carboxymeth-Glycerin-Polysorb (REFRESH OPTIVE ADVANCED) 0.5-1-0.5 % SOLN Place 1 drop into both eyes 3 (three) times daily.      . clopidogrel (PLAVIX) 75 MG tablet Take 75 mg by mouth every morning.       . cyclobenzaprine (FLEXERIL) 5 MG tablet Take 5 mg by mouth 2 (two) times daily as needed for muscle spasms.       . ferrous sulfate 325 (65 FE) MG tablet Take 325 mg by mouth 2 (two) times daily.        . Fluticasone-Salmeterol (ADVAIR) 100-50 MCG/DOSE AEPB Inhale 1 puff into the lungs every 12 (twelve) hours.      . hydrALAZINE (APRESOLINE) 50 MG tablet Take 1 tablet (50 mg total) by mouth every 8 (eight) hours.  90 tablet  6  . insulin glargine (LANTUS) 100 UNIT/ML injection Inject 50 Units into the skin daily after breakfast.       . insulin lispro (HUMALOG) 100 UNIT/ML injection Inject 2-14 Units into the skin 3  (three) times daily between meals as needed. sliding scale-150-175=2 units, 176-200=4 units, 201-225=6 units, 226-250=8 units, 251-300=10 units, 301-350=12 units & >350=14 units & contact doctor      . ipratropium-albuterol (DUONEB) 0.5-2.5 (3) MG/3ML SOLN Take 3 mLs by nebulization 3 (three) times daily.       . isosorbide mononitrate (IMDUR) 60 MG 24 hr tablet Take 1 tablet (60 mg total) by mouth daily.  30 tablet  11  . levothyroxine (SYNTHROID, LEVOTHROID) 100 MCG tablet Take 100 mcg by mouth daily before breakfast.      . magnesium 30 MG tablet Take 30 mg by mouth 2 (two) times daily.      . metolazone (ZAROXOLYN) 5 MG tablet Take 5 mg by mouth daily as needed. Only takes for a weight gain of 5lbs      . nebivolol (BYSTOLIC) 5 MG tablet Take 1 tablet (5 mg total) by mouth daily.  30 tablet  6  . omeprazole (PRILOSEC) 20 MG capsule Take 20 mg by mouth every morning.       . potassium chloride SA (K-DUR,KLOR-CON) 20 MEQ tablet Take 1 tablet (20 mEq total) by mouth 2 (two) times daily.  60 tablet  6  . senna (SENOKOT) 8.6 MG TABS Take 1 tablet by mouth.      . furosemide (LASIX) 40 MG tablet Take 1 tablet (40 mg total) by mouth daily.  30 tablet  6  . nitroGLYCERIN (NITROSTAT) 0.4 MG SL tablet Place 0.4 mg under the tongue every 5 (five) minutes as needed for chest pain.        Scheduled: . atorvastatin  10 mg Oral q1800  . bimatoprost  1 drop Both Eyes QHS  . brimonidine  1 drop Both Eyes BID  . clopidogrel  75 mg Oral BH-q7a  . ferrous sulfate  325 mg Oral BID  . furosemide  40 mg Oral Daily  . hydrALAZINE  100 mg Oral Q8H  . insulin aspart  0-15 Units Subcutaneous TID WC  . insulin glargine  30 Units Subcutaneous Daily  . isosorbide mononitrate  60 mg Oral Daily  . levothyroxine  100 mcg  Oral QAC breakfast  . LORazepam  2 mg Intravenous Once  . losartan  25 mg Oral Daily  . magnesium chloride  1 tablet Oral Daily  . nebivolol  10 mg Oral Daily  . potassium chloride SA  20 mEq Oral  BID   Continuous: . 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 Patient will require PT OT, 24-hour supervision at home. I will discuss with the family.  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   Diabetes, last a1c 6.9 08/2012   Chronic kidney disease stage 3-4, ARB recently added over the weekend, we will continue to monitor  Low-grade fever, check UA C/S   LOS: 5 days   Kiala Faraj D 10/07/2012, 12:55 PM   I spoke with patient's family today, her sister Rosey Bath. She now would like [place] to seek skilled nursing facility placement.

## 2012-10-07 NOTE — Progress Notes (Addendum)
Physical Therapy Treatment Patient Details Name: Wanda [name] MRN: [id number] DOB: 20-Feb-1946 Today's Date: 10/07/2012 Time: 1430-1500 PT Time Calculation (min): 30 min  PT Assessment / Plan / Recommendation  PT Comments   Pt currently needs min to mod assist with ambulation and bathroom negotiation. Pt most limited by impaired cognition, needs max verbal cues with most functional activities. Pt continues to need 24 hour supervision/assist at D/C, if she does not have this support she will need SNF.   Follow Up Recommendations  Home health PT;Supervision/Assistance - 24 hour           Equipment Recommendations  Rolling walker with 5" wheels    Recommendations for Other Services OT consult (Needs ADL equipment)  Frequency Min 3X/week   Progress towards PT Goals Progress towards PT goals: Progressing toward goals  Plan Current plan remains appropriate    Precautions / Restrictions Precautions Precautions: Fall Restrictions Weight Bearing Restrictions: No   Pertinent Vitals/Pain No c/o pain    Mobility  Bed Mobility Bed Mobility:sit>supine with min assist cues for sequencing Details for Bed Mobility Assistance: seated at start with family in room, pt returned to bed at end of session with bed alarm set as family left Transfers Transfers: Sit to Stand;Stand to Sit Sit to Stand: 4: Min assist;With upper extremity assist;With armrests;From chair/3-in-1;3: Mod assist;From toilet Stand to Sit: 4: Min assist;With upper extremity assist;With armrests;To chair/3-in-1 Details for Transfer Assistance: Verbal and tactile cues for hand placement.  Required physical assist to reach for armrests when sitting - difficulty following verbal commands. Increased difficulty from low toilet, required grab bars and heavy mod assist. Ambulation/Gait Ambulation/Gait Assistance: 4: Min assist Ambulation Distance (Feet): 45 Feet Assistive device: Rolling walker Ambulation/Gait Assistance Details:  Cues for RW negotiation particularly through tight spaces, signifcant difficulty attempting to side step needed constant verbal and tactle cues, therapist had to negotiation RW.  Gait Pattern: Step-through pattern;Decreased step length - right;Decreased step length - left;Trunk flexed Gait velocity: Slow gait speed General Gait Details: Distance limited by need to use bathroom. Pt has overall poor awareness with mobility Stairs: No Wheelchair Mobility Wheelchair Mobility: No      PT Goals (current goals can now be found in the care plan section)    Visit Information  Last PT Received On: 10/07/12 Assistance Needed: +1 History of Present Illness: Patient is a 67 yo female admitted with hypertensive urgency and altered mental status.    Subjective Data   "Is that where I use the bathroom?" pointing to the shower while already sitting on the commode.   Cognition  Cognition Arousal/Alertness: Awake/alert Behavior During Therapy: Flat affect Overall Cognitive Status: History of cognitive impairments - at baseline    Balance  Balance Balance Assessed: Yes Static Standing Balance Static Standing - Balance Support: Bilateral upper extremity supported Static Standing - Level of Assistance: 4: Min assist Static Standing - Comment/# of Minutes: Balance in bathroom while being cleaned from BM. Cues needed for safe hand placement for stability High Level Balance High Level Balance Activites: Side stepping;Backward walking High Level Balance Comments: Mod assist, max verbal cues for safety and sequencing  End of Session PT - End of Session Equipment Utilized During Treatment: Gait belt Activity Tolerance: Patient tolerated treatment well Patient left: with call bell/phone within reach;in bed;with bed alarm set   GP     Wilhemina Bonito 10/07/2012, 4:28 PM

## 2012-10-08 LAB — GLUCOSE, CAPILLARY
Glucose-Capillary: 160 mg/dL — ABNORMAL HIGH (ref 70–99)
Glucose-Capillary: 89 mg/dL (ref 70–99)
Glucose-Capillary: 65 mg/dL — ABNORMAL LOW (ref 70–99)
Glucose-Capillary: 97 mg/dL (ref 70–99)

## 2012-10-08 NOTE — Progress Notes (Addendum)
Pt having difficulty swallowing medications this AM. Witnessed patient keep medication in mouth for 5 minutes before attempting to swallow. Per pt, denies any difficulty and states that it takes awhile for her to try swallowing medication. No pain reported. Paged Potlice MD made aware. No new orders at this time. Will endorse to morning nurse. Will continue to monitor. Gilman Schmidt

## 2012-10-08 NOTE — Progress Notes (Signed)
Subjective: Patient without any new complaints. She appears to be pocketing food and needs encouragement to swallow. There are no signs of choking or coughing. Otherwise she is at her baseline. I did discuss with family yesterday, they cannot provide 24-hour care at home. We will begin search for skilled nursing facility  Objective: Vital signs in last 24 hours: Temp:  [98.8 F (37.1 C)-99.2 F (37.3 C)] 98.8 F (37.1 C) (07/01 0555) Pulse Rate:  [51-56] 55 (07/01 0555) Resp:  [18-20] 18 (07/01 0555) BP: (156-174)/(70-76) 174/70 mmHg (07/01 0555) SpO2:  [97 %-98 %] 98 % (07/01 0555) Weight:  [89.4 kg (197 lb 1.5 oz)] 89.4 kg (197 lb 1.5 oz) (06/30 2112) Weight change:  Last BM Date: 10/02/12  Intake/Output from previous day: 06/30 0701 - 07/01 0700 In: 50 [P.O.:50] Out: 400 [Urine:400] Intake/Output this shift:    General appearance: pleasant, no apparent distress Resp: clear to auscultation bilaterally Cardio: regular rate and rhythm, S1, S2 normal, no murmur, click, rub or gallop Extremities: extremities normal, atraumatic, no cyanosis or edema  Lab Results:  Results for orders placed during the hospital encounter of 10/02/12 (from the past 24 hour(s))  GLUCOSE, CAPILLARY     Status: Abnormal   Collection Time    10/07/12 11:28 AM      Result Value Range   Glucose-Capillary 111 (*) 70 - 99 mg/dL   Comment 1 Documented in Chart     Comment 2 Notify RN    URINALYSIS, ROUTINE W REFLEX MICROSCOPIC     Status: Abnormal   Collection Time    10/07/12  1:54 PM      Result Value Range   Color, Urine YELLOW  YELLOW   APPearance CLEAR  CLEAR   Specific Gravity, Urine 1.011  1.005 - 1.030   pH 7.5  5.0 - 8.0   Glucose, UA NEGATIVE  NEGATIVE mg/dL   Hgb urine dipstick NEGATIVE  NEGATIVE   Bilirubin Urine NEGATIVE  NEGATIVE   Ketones, ur NEGATIVE  NEGATIVE mg/dL   Protein, ur >409 (*) NEGATIVE mg/dL   Urobilinogen, UA 0.2  0.0 - 1.0 mg/dL   Nitrite NEGATIVE  NEGATIVE   Leukocytes, UA NEGATIVE  NEGATIVE  URINE MICROSCOPIC-ADD ON     Status: Abnormal   Collection Time    10/07/12  1:54 PM      Result Value Range   Squamous Epithelial / LPF FEW (*) RARE   WBC, UA 0-2  <3 WBC/hpf   RBC / HPF 0-2  <3 RBC/hpf   Bacteria, UA RARE  RARE  GLUCOSE, CAPILLARY     Status: None   Collection Time    10/07/12  4:43 PM      Result Value Range   Glucose-Capillary 93  70 - 99 mg/dL   Comment 1 Documented in Chart     Comment 2 Notify RN    GLUCOSE, CAPILLARY     Status: Abnormal   Collection Time    10/07/12  9:10 PM      Result Value Range   Glucose-Capillary 101 (*) 70 - 99 mg/dL  GLUCOSE, CAPILLARY     Status: Abnormal   Collection Time    10/08/12  7:46 AM      Result Value Range   Glucose-Capillary 160 (*) 70 - 99 mg/dL      Studies/Results: No results found.  Medications:  Prior to Admission:  Prescriptions prior to admission  Medication Sig Dispense Refill  . albuterol (PROVENTIL HFA;VENTOLIN HFA) 108 (90  BASE) MCG/ACT inhaler Inhale 2 puffs into the lungs every 6 (six) hours as needed for wheezing or shortness of breath.       Marland Kitchen aspirin EC 81 MG tablet Take 81 mg by mouth daily.      Marland Kitchen atorvastatin (LIPITOR) 10 MG tablet Take 1 tablet (10 mg total) by mouth daily at 6 PM.  30 tablet  6  . Bimatoprost (LUMIGAN) 0.01 % SOLN Place 1 drop into both eyes at bedtime.       . brimonidine (ALPHAGAN) 0.15 % ophthalmic solution Place 1 drop into both eyes 2 (two) times daily.       . Carboxymeth-Glycerin-Polysorb (REFRESH OPTIVE ADVANCED) 0.5-1-0.5 % SOLN Place 1 drop into both eyes 3 (three) times daily.      . clopidogrel (PLAVIX) 75 MG tablet Take 75 mg by mouth every morning.       . cyclobenzaprine (FLEXERIL) 5 MG tablet Take 5 mg by mouth 2 (two) times daily as needed for muscle spasms.       . ferrous sulfate 325 (65 FE) MG tablet Take 325 mg by mouth 2 (two) times daily.        . Fluticasone-Salmeterol (ADVAIR) 100-50 MCG/DOSE AEPB Inhale 1 puff  into the lungs every 12 (twelve) hours.      . hydrALAZINE (APRESOLINE) 50 MG tablet Take 1 tablet (50 mg total) by mouth every 8 (eight) hours.  90 tablet  6  . insulin glargine (LANTUS) 100 UNIT/ML injection Inject 50 Units into the skin daily after breakfast.       . insulin lispro (HUMALOG) 100 UNIT/ML injection Inject 2-14 Units into the skin 3 (three) times daily between meals as needed. sliding scale-150-175=2 units, 176-200=4 units, 201-225=6 units, 226-250=8 units, 251-300=10 units, 301-350=12 units & >350=14 units & contact doctor      . ipratropium-albuterol (DUONEB) 0.5-2.5 (3) MG/3ML SOLN Take 3 mLs by nebulization 3 (three) times daily.       . isosorbide mononitrate (IMDUR) 60 MG 24 hr tablet Take 1 tablet (60 mg total) by mouth daily.  30 tablet  11  . levothyroxine (SYNTHROID, LEVOTHROID) 100 MCG tablet Take 100 mcg by mouth daily before breakfast.      . magnesium 30 MG tablet Take 30 mg by mouth 2 (two) times daily.      . metolazone (ZAROXOLYN) 5 MG tablet Take 5 mg by mouth daily as needed. Only takes for a weight gain of 5lbs      . nebivolol (BYSTOLIC) 5 MG tablet Take 1 tablet (5 mg total) by mouth daily.  30 tablet  6  . omeprazole (PRILOSEC) 20 MG capsule Take 20 mg by mouth every morning.       . potassium chloride SA (K-DUR,KLOR-CON) 20 MEQ tablet Take 1 tablet (20 mEq total) by mouth 2 (two) times daily.  60 tablet  6  . senna (SENOKOT) 8.6 MG TABS Take 1 tablet by mouth.      . furosemide (LASIX) 40 MG tablet Take 1 tablet (40 mg total) by mouth daily.  30 tablet  6  . nitroGLYCERIN (NITROSTAT) 0.4 MG SL tablet Place 0.4 mg under the tongue every 5 (five) minutes as needed for chest pain.        Scheduled: . atorvastatin  10 mg Oral q1800  . bimatoprost  1 drop Both Eyes QHS  . brimonidine  1 drop Both Eyes BID  . clopidogrel  75 mg Oral BH-q7a  . ferrous sulfate  325  mg Oral BID  . furosemide  40 mg Oral Daily  . hydrALAZINE  100 mg Oral Q8H  . insulin aspart   0-15 Units Subcutaneous TID WC  . insulin glargine  30 Units Subcutaneous Daily  . isosorbide mononitrate  60 mg Oral Daily  . levothyroxine  100 mcg Oral QAC breakfast  . LORazepam  2 mg Intravenous Once  . losartan  25 mg Oral Daily  . magnesium chloride  1 tablet Oral Daily  . nebivolol  10 mg Oral Daily  . potassium chloride SA  20 mEq Oral BID   Continuous: . 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 Currently she is at her baseline level function. 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   Diabetes, last a1c 6.9 08/2012   Chronic kidney disease stage 3-4, ARB recently added over the weekend, we will continue to monitor  Patient apparently pocketing food, we will ask speech therapy if there are any recommendations other than increased encouragement to eat    LOS: 6 days   Wanda [name] D 10/08/2012, 9:54 AM

## 2012-10-08 NOTE — Clinical Social Work Psychosocial (Signed)
Clinical Social Work Department BRIEF PSYCHOSOCIAL ASSESSMENT 10/08/2012  Patient:  Wanda [name], Wanda [name]     Account Number:  [id number]     Admit date:  10/02/2012  Clinical Social Worker:  Delmer Islam  Date/Time:  10/08/2012 01:30 AM  Referred by:  Physician  Date Referred:  10/07/2012 Referred for  SNF Placement   Other Referral:   Interview type:  Patient Other interview type:   CSW talked with patient and sister Shaunna Rosetti (c: [phone] & h: [phone]).    PSYCHOSOCIAL DATA Living Status:  FAMILY Admitted from facility:   Level of care:   Primary support name:  Ashanty Coltrane Primary support relationship to patient:  SIBLING Degree of support available:   Sister Jillyn Stacey also involved and supportive. Patient has 5 adult children that live in [place], [place]. Patient plans to return to [place] at some point after discharge.    CURRENT CONCERNS Current Concerns  Post-Acute Placement   Other Concerns:    SOCIAL WORK ASSESSMENT / PLAN CSW talked with patient and sister Ileta Ofarrell regarding recommendation of ST rehab prior to discharge home. Patient and family in agreement. CSW explained SNF search process, gave SNF list for [facility] and advised that they will be provided with facility responses. Sister asked questions and patient was quiet but listening during conversation. She would answer questions when asked however. Sister indicated that she and other sister Velna Hatchet will go over list to determine their preferences, from the facilities that take [facility].   Assessment/plan status:  Psychosocial Support/Ongoing Assessment of Needs Other assessment/ plan:   Information/referral to community resources:   Sister given SNF list for [facility]    PATIENT'S/FAMILY'S RESPONSE TO PLAN OF CARE: Patient pleasant but quiet through most of conversation. Sister very involved and concerned about patient and will assist in selecting a skilled facility.

## 2012-10-08 NOTE — Progress Notes (Addendum)
Pt was found down on the floor by our assisted director Victorino Dike after she was attempting to get up on her own.  Pt had an incontinent episode. Pt denied any pain an assessment. No skin tears or bruising noted. Vs stable. BP still elevated. Neuro checks normal. Pt was placed back in the bed with the bed alarm. Her sister Aggie Cosier was called and notified of the fall. A post huddle was done after the fall with unit staff. Will cont to monitor.

## 2012-10-08 NOTE — Evaluation (Signed)
Clinical/Bedside Swallow Evaluation Patient Details  Name: VIENNA FOLDEN MRN: [id number] Date of Birth: 1945/12/28  Today's Date: 10/08/2012 Time: 1120-1145 SLP Time Calculation (min): 25 min  Past Medical History:  Past Medical History  Diagnosis Date  . Palpitations   . HTN (hypertension)   . CAD (coronary artery disease)   . Bigeminy   . Obesity   . Adverse effect of anesthesia 05/2009    MI during cardiac  stent placement  . Angina   . Myocardial infarction 05/2009  . Heart murmur   . Asthma   . Hypothyroidism     "started med 01/2011"  . Blood transfusion     "not related to OR" (09/05/2012)  . CHF (congestive heart failure)   . Recurrent upper respiratory infection (URI)     "colds every year"  . Hiatal hernia   . Depression      "lost son 03/2010"  . GERD (gastroesophageal reflux disease)   . 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 Atrial fibrillation   . Chronic kidney disease (CKD), stage III (moderate)     Hattie Perch 09/04/2012 (09/04/2012)  . PONV (postoperative nausea and vomiting)   . Pneumonia     'a couple times; last time 05/2012" (09/05/2012)  . Exertional shortness of breath   . Type II diabetes mellitus   . Iron deficiency anemia 02/14/11     "I have taken shots for it; I take iron pills now"  . Spinal headache   . Headache     "only related to my blood pressure" (09/05/2012)  . Arthritis     "right arm" (09/05/2012)  . Chronic lower back pain    Past Surgical History:  Past Surgical History  Procedure Laterality Date  . Video assisted thoracoscopy (vats)/ lobectomy Left 04/2010  . Tubal ligation    . Abdominal hysterectomy  ~ 1986  . Laparoscopic salpingoopherectomy Bilateral post 1986  . Refractive surgery  ~ 2009  . Dilation and curettage of uterus    . Loop recorder implant  03/01/12    [employer]  . Coronary angioplasty with stent placement  05/2009     "had MI during this"  . 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  Per neuro note 10/05/12 pt's encephalopathy has resovled.  Swallow evaluation ordered due to pt excessively holding medications in her mouth requiring cues to swallow.  CXR 6/26 indicated cardiomegaly without congestion, ? early edema, CT head 6/26 negative for acute change.     Assessment / Plan / Recommendation Clinical Impression  Pt presents with only minimal oral swallow difficulties which SLP suspects is near baseline.  Pt self fed italian ice, graham cracker, gingerale today with overall timely swallow and only minimal posterior oral stasis (right).  Pt effectively cleared stasis with dry and liquid swallows and SLP used mirror to help pt to locate stasis.   SLP suspects pt's mild dysphagia (most notably earlier in hospital course) was due to mentation and dentition issues.  Pt also reports she is delayed with her swallowing when taking medications because she doesn't want to take them.    Educated pt to compensation strategies including consuming items with better sensory input (carbonated, cold, etc) and assuring oral clearance when finished with meals.  Used teach back for compensation strategies.   No further SLP indicated as all education with pt (and Rn) completed.  Thanks for the consult.        Aspiration Risk  Mild    Diet Recommendation Regular;Thin liquid (encouraged pt to order softer foods)   Liquid Administration via: Straw;Cup Medication Administration: Whole meds with puree (follow with liquids) Supervision: Patient able to self feed;Intermittent supervision to cue for compensatory strategies Compensations: Slow rate;Small sips/bites;Check for pocketing Postural Changes and/or Swallow Maneuvers: Seated upright 90 degrees;Upright 30-60 min after meal    Other  Recommendations Oral Care Recommendations: Oral care BID   Follow Up Recommendations  None     Frequency and Duration   n/a     Pertinent Vitals/Pain Afebrile, decreased    SLP Swallow Goals  n/a   Swallow Study Prior Functional Status   Fed self without dysphagia prior to admission per pt.     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  Per neuro note 10/05/12 pt's encephalopathy has resovled.  Swallow evaluation ordered due to pt excessively holding medications in her mouth requiring cues to swallow.  CXR 6/26 indicated cardiomegaly without congestion, ? early edema, CT head 6/26 negative for acute change.   Type of Study: Bedside swallow evaluation Diet Prior to this Study: Regular;Thin liquids Temperature Spikes Noted: No Respiratory Status: Room air History of Recent Intubation: No Behavior/Cognition: Alert;Cooperative (? flat affect) Oral Cavity - Dentition: Missing dentition (pt reports she lost her dentures approx one month ago) Self-Feeding Abilities: Able to feed self Patient Positioning: Upright in chair Baseline Vocal Quality: Clear Volitional Cough: Weak Volitional Swallow: Unable to elicit    Oral/Motor/Sensory Function Overall Oral Motor/Sensory Function: Appears within functional limits for tasks assessed (generalized weakness, no focal cn deficits)   Ice Chips Ice chips: Not tested   Thin Liquid Thin Liquid: Within functional limits Presentation: Self Fed;Cup;Straw    Nectar Thick Nectar Thick Liquid: Not tested   Honey Thick Honey Thick Liquid: Not tested   Puree Puree: Within functional limits Presentation: Self Fed;Spoon   Solid   GO    Presentation: Self Fed Other Comments: mild oral stasis noted, posterior oral cavity on right - pt stated she sensed and consumes liquids to aid clearance       Donavan Burnet, MS [facility] SLP [phone]

## 2012-10-08 NOTE — Progress Notes (Signed)
Physical Therapy Treatment Patient Details Name: Wanda [name] MRN: [id number] DOB: 23-Apr-1945 Today's Date: 10/08/2012 Time: 7829-5621 PT Time Calculation (min): 24 min  PT Assessment / Plan / Recommendation  PT Comments   Pt making slow, steady progress.  Follow Up Recommendations  SNF     Does the patient have the potential to tolerate intense rehabilitation     Barriers to Discharge        Equipment Recommendations  Rolling walker with 5" wheels    Recommendations for Other Services    Frequency Min 2X/week   Progress towards PT Goals Progress towards PT goals: Progressing toward goals  Plan Discharge plan needs to be updated;Frequency needs to be updated    Precautions / Restrictions Precautions Precautions: Fall Restrictions Weight Bearing Restrictions: No   Pertinent Vitals/Pain N/A    Mobility  Transfers Sit to Stand: 4: Min assist;With upper extremity assist;With armrests;From chair/3-in-1 Stand to Sit: 4: Min assist;With upper extremity assist;With armrests;To chair/3-in-1 Details for Transfer Assistance: verbal and tactile cues for hand placement and to initiate sitting. Ambulation/Gait Ambulation/Gait Assistance: 4: Min assist Ambulation Distance (Feet): 120 Feet Assistive device: Rolling walker Ambulation/Gait Assistance Details: verbal cues to look up and stand more erect. Assist to steer walker.  Pt steered walker straight into bed instead of going around it. Gait Pattern: Step-through pattern;Decreased step length - left;Decreased step length - right;Trunk flexed Gait velocity: Slow gait speed    Exercises General Exercises - Upper Extremity Shoulder Flexion: AAROM;Both;10 reps;Seated General Exercises - Lower Extremity Long Arc Quad: AAROM;Both;10 reps;Seated   PT Diagnosis:    PT Problem List:   PT Treatment Interventions:     PT Goals (current goals can now be found in the care plan section)    Visit Information  Last PT Received On:  10/08/12 Assistance Needed: +1 History of Present Illness: Patient is a 67 yo female admitted with hypertensive urgency and altered mental status.    Subjective Data      Cognition  Cognition Arousal/Alertness: Awake/alert Behavior During Therapy: Flat affect Overall Cognitive Status: History of cognitive impairments - at baseline    Balance  Static Standing Balance Static Standing - Balance Support: Bilateral upper extremity supported Static Standing - Level of Assistance: 4: Min assist  End of Session PT - End of Session Equipment Utilized During Treatment: Gait belt Activity Tolerance: Patient tolerated treatment well Patient left: in chair;with call bell/phone within reach Nurse Communication: Mobility status   GP     Aiyden Lauderback 10/08/2012, 12:18 PM  [employer] PT [phone]

## 2012-10-08 NOTE — Clinical Social Work Placement (Addendum)
Clinical Social Work Department CLINICAL SOCIAL WORK PLACEMENT NOTE 10/08/2012  Patient:  Wanda [name], Wanda [name]  Account Number:  [id number] Admit date:  10/02/2012  Clinical Social Worker:  Genelle Bal, LCSW  Date/time:  10/08/2012 02:19 AM  Clinical Social Work is seeking post-discharge placement for this patient at the following level of care:   SKILLED NURSING   (*CSW will update this form in Epic as items are completed)   10/08/2012  Patient/family provided with [facility] Department of Clinical Social Work's list of facilities offering this level of care within the geographic area requested by the patient (or if unable, by the patient's family).  10/08/2012  Patient/family informed of their freedom to choose among providers that offer the needed level of care, that participate in Medicare, Medicaid or managed care program needed by the patient, have an available bed and are willing to accept the patient.    Patient/family informed of [facility]' ownership interest in [facility], as well as of the fact that they are under no obligation to receive care at this facility.  PASARR submitted to EDS on 10/08/2012 PASARR number received from EDS on 10/08/12 -[id number] A    FL2 transmitted to all facilities in geographic area requested by pt/family on  10/08/2012 FL2 transmitted to all facilities within larger geographic area on   Patient informed that his/her managed care company has contracts with or will negotiate with  certain facilities, including the following:     Patient/family informed of bed offers received: 10/09/12  Patient chooses bed at [facility] Physician recommends and patient chooses bed at    Patient to be transferred to [facility] on 10/10/12  Patient to be transferred to facility by ambulance  The following physician request were entered in Epic:   Additional Comments: 10/09/12 - Ms. Margart Sickles and her sister will visit [facility] today at 12:30 and  then call CSW with decision. Ms. Chain stated that this facility will be very convenient for her. 10/10/12 - CSW contacted sister Chelesa Weingartner to advise that patient in route to facility. Sister expressed appreciation to CSW for working with patient and family on placement.

## 2012-10-09 LAB — GLUCOSE, CAPILLARY
Glucose-Capillary: 72 mg/dL (ref 70–99)
Glucose-Capillary: 100 mg/dL — ABNORMAL HIGH (ref 70–99)

## 2012-10-09 LAB — CULTURE, BLOOD (ROUTINE X 2): Culture: NO GROWTH

## 2012-10-09 MED ORDER — HYDRALAZINE HCL 20 MG/ML IJ SOLN
10.0000 mg | Freq: Four times a day (QID) | INTRAMUSCULAR | Status: DC | PRN
  Administered 2012-10-09: 10 mg via INTRAVENOUS
  Filled 2012-10-09: qty 1

## 2012-10-09 NOTE — Progress Notes (Signed)
Physical Therapy Treatment Patient Details Name: MARILLYN GOREN MRN: [id number] DOB: 1945-05-21 Today's Date: 10/09/2012 Time: 4098-1191 PT Time Calculation (min): 28 min  PT Assessment / Plan / Recommendation  PT Comments   Pt ambulated in hallway with increased time due to slow speed and difficulty negotiating RW around objects especially in L visual field (?vision vs cognition).  Pt reports she sees the objects but never avoided them and ran into them with verbal cues.  Follow Up Recommendations  SNF     Does the patient have the potential to tolerate intense rehabilitation     Barriers to Discharge        Equipment Recommendations  Rolling walker with 5" wheels    Recommendations for Other Services    Frequency     Progress towards PT Goals Progress towards PT goals: Progressing toward goals  Plan Current plan remains appropriate    Precautions / Restrictions Precautions Precautions: Fall   Pertinent Vitals/Pain Pt denied pain during ambulation, no c/o pain    Mobility  Bed Mobility Bed Mobility: Not assessed Transfers Transfers: Sit to Stand;Stand to Sit Sit to Stand: 4: Min assist;With upper extremity assist;With armrests;From chair/3-in-1 Stand to Sit: 4: Min assist;With upper extremity assist;With armrests;To chair/3-in-1 Details for Transfer Assistance: verbal and tactile cues for hand placement and to initiate sitting. Ambulation/Gait Ambulation/Gait Assistance: 4: Min assist Ambulation Distance (Feet): 240 Feet Assistive device: Rolling walker Ambulation/Gait Assistance Details: verbal cues for avoiding objects on L side, many obstacles in hallway and pt with increased time and planned failure for running into objects then increasing time to go around, occasional assist for steering RW, pt reports she sees object on L side Gait Pattern: Step-through pattern;Trunk flexed;Decreased stride length Gait velocity: Slow gait speed General Gait Details: occasional  rest break for fatigue    Exercises     PT Diagnosis:    PT Problem List:   PT Treatment Interventions:     PT Goals (current goals can now be found in the care plan section)    Visit Information  Last PT Received On: 10/09/12 Assistance Needed: +1    Subjective Data      Cognition  Cognition Arousal/Alertness: Awake/alert Behavior During Therapy: Flat affect Overall Cognitive Status: History of cognitive impairments - at baseline    Balance     End of Session PT - End of Session Activity Tolerance: Patient tolerated treatment well Patient left: in chair;with family/visitor present Nurse Communication: Mobility status;Other (comment) (pt on [facility] with family for bathing, ?L visual field)   GP     Michaelann Gunnoe,KATHrine E 10/09/2012, 4:34 PM Zenovia Jarred, PT, DPT 10/09/2012 Pager: [phone]

## 2012-10-09 NOTE — Progress Notes (Signed)
10/09/2012 7:24 PM Hydralazine 10mg  given at 1844 per prn MD order by Melvyn Novas, RN.  BP recheck at 1924 reveals a BP of 169/68.  Will continue to monitor patient. Eunice Blase

## 2012-10-09 NOTE — Progress Notes (Signed)
10/09/2012 11:45 AM  Went in to recheck patient's BP and noticed that patient had pocketed some of the 1000 am meds that she had been administered.  BP this check was 163/76.  Dr. Nehemiah Settle notified of BP, as well as the pocketing of the meds and that I was unsure which ones she had actually swallowed and which ones she pocketed.  No further orders received.  Will continue to monitor patient. Eunice Blase

## 2012-10-09 NOTE — Progress Notes (Signed)
MD notified via answering service regarding BP.  Her SBP warrants a prn BP meds, however, the BP meds ordered are not specific.  Waiting to hear back from MD on order clarification in order to give meds.   Eunice Blase

## 2012-10-09 NOTE — Progress Notes (Signed)
Subjective: Events noted last p.m. In regards to patient been on floor, no obvious injury. Patient blood pressure was elevated this morning it is now better. Patient still pocketing food and her medication. Nursing aware to observe patient taking her medication   Objective: Vital signs in last 24 hours: Temp:  [98.6 F (37 C)-99.5 F (37.5 C)] 98.6 F (37 C) (07/02 1143) Pulse Rate:  [54-59] 54 (07/02 1143) Resp:  [18] 18 (07/02 0900) BP: (132-204)/(66-104) 163/76 mmHg (07/02 1143) SpO2:  [93 %-98 %] 95 % (07/02 1143) Weight:  [91 kg (200 lb 9.9 oz)] 91 kg (200 lb 9.9 oz) (07/01 2108) Weight change: 1.6 kg (3 lb 8.4 oz) Last BM Date: 10/02/12  Intake/Output from previous day: 07/01 0701 - 07/02 0700 In: 240 [P.O.:240] Out: -  Intake/Output this shift: Total I/O In: 240 [P.O.:240] Out: 300 [Urine:300]  General appearance: patient pleasant, no apparent distress Resp: clear to auscultation bilaterally Cardio: regular rate and rhythm Extremities: extremities normal, atraumatic, no cyanosis or edema  Lab Results:  Results for orders placed during the hospital encounter of 10/02/12 (from the past 24 hour(s))  GLUCOSE, CAPILLARY     Status: None   Collection Time    10/08/12 12:30 PM      Result Value Range   Glucose-Capillary 89  70 - 99 mg/dL  GLUCOSE, CAPILLARY     Status: Abnormal   Collection Time    10/08/12  4:52 PM      Result Value Range   Glucose-Capillary 65 (*) 70 - 99 mg/dL   Comment 1 Notify RN    GLUCOSE, CAPILLARY     Status: None   Collection Time    10/08/12  5:10 PM      Result Value Range   Glucose-Capillary 97  70 - 99 mg/dL   Comment 1 Notify RN    GLUCOSE, CAPILLARY     Status: Abnormal   Collection Time    10/08/12  9:07 PM      Result Value Range   Glucose-Capillary 103 (*) 70 - 99 mg/dL  GLUCOSE, CAPILLARY     Status: None   Collection Time    10/09/12  7:56 AM      Result Value Range   Glucose-Capillary 72  70 - 99 mg/dL  GLUCOSE,  CAPILLARY     Status: Abnormal   Collection Time    10/09/12 10:27 AM      Result Value Range   Glucose-Capillary 102 (*) 70 - 99 mg/dL  GLUCOSE, CAPILLARY     Status: Abnormal   Collection Time    10/09/12 11:49 AM      Result Value Range   Glucose-Capillary 106 (*) 70 - 99 mg/dL      Studies/Results: No results found.  Medications:  Scheduled: . atorvastatin  10 mg Oral q1800  . bimatoprost  1 drop Both Eyes QHS  . brimonidine  1 drop Both Eyes BID  . clopidogrel  75 mg Oral BH-q7a  . ferrous sulfate  325 mg Oral BID  . furosemide  40 mg Oral Daily  . hydrALAZINE  100 mg Oral Q8H  . insulin aspart  0-15 Units Subcutaneous TID WC  . insulin glargine  30 Units Subcutaneous Daily  . isosorbide mononitrate  60 mg Oral Daily  . levothyroxine  100 mcg Oral QAC breakfast  . LORazepam  2 mg Intravenous Once  . losartan  25 mg Oral Daily  . magnesium chloride  1 tablet Oral Daily  .  nebivolol  10 mg Oral Daily  . potassium chloride SA  20 mEq Oral BID   Continuous: . 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 Currently she is at her baseline level function. 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   Diabetes, last a1c 6.9 08/2012   Chronic kidney disease stage 3-4, ARB recently added over the weekend, we will continue to monitor   Continue current treatment   LOS: 7 days   Rekha Hobbins D 10/09/2012, 12:12 PM

## 2012-10-09 NOTE — Progress Notes (Signed)
10/09/2012 12:56 PM  Bed alarm sounded for patient around 1220.  I went in the room and noticed the patient's daughter was assisting the patient to the bedside commode.  I turned the bed alarm off, and asked her if I could help her get her mom to the bathroom.  She replied no, that she didn't need anything.  I did remind her that she fell yesterday and we just want to be sure we do everything in our power to keep her safe, to which she verbalized understanding.  I again offered to help her in anyway, to which she declined, stating that she didn't need any help.  I again reinforced my name with the daughter, as well as the student nurse's name that is currently working with the patient, and encouraged her that if she needed help in anyway to call for either one of [place], to which she verbalized understanding.  Will continue to monitor patient. Eunice Blase

## 2012-10-09 NOTE — Progress Notes (Signed)
Noted patient family members assisting patient from bedside commode back to chair. Upon entering the room informed family members taht patietn is a high fall risk and for them to call for assistance when planning to get patient up. Kellina Dreese, Charlyne Quale

## 2012-10-09 NOTE — Progress Notes (Signed)
Pt called out requesting pain medication. Attempted x 2 to give pt medication, pt states she does not need it. Pt also refusing PM lipitor, refusing to take anything PO. Will continue to monitor.

## 2012-10-10 LAB — GLUCOSE, CAPILLARY: Glucose-Capillary: 113 mg/dL — ABNORMAL HIGH (ref 70–99)

## 2012-10-10 LAB — BASIC METABOLIC PANEL
BUN: 28 mg/dL — ABNORMAL HIGH (ref 6–23)
CO2: 24 mEq/L (ref 19–32)
Chloride: 106 mEq/L (ref 96–112)
GFR calc Af Amer: 35 mL/min — ABNORMAL LOW (ref >90)
Potassium: 3.9 mEq/L (ref 3.5–5.1)

## 2012-10-10 MED ORDER — NEBIVOLOL HCL 5 MG PO TABS: 10.0000 mg | ORAL_TABLET | Freq: Every day | ORAL | Status: DC

## 2012-10-10 MED ORDER — LOSARTAN POTASSIUM 25 MG PO TABS: 25.0000 mg | ORAL_TABLET | Freq: Every day | ORAL | Status: DC

## 2012-10-10 MED ORDER — HYDRALAZINE HCL 100 MG PO TABS: 100.0000 mg | ORAL_TABLET | Freq: Three times a day (TID) | ORAL | Status: DC

## 2012-10-10 NOTE — Progress Notes (Signed)
Late Entry for 10/09/12 approx 3pm. Called Dr. Idelle Crouch office to discuss patients plans for discharge and plan. Spoke with Dr. Earl Gala on call. Dr. Earl Gala stated that patient would not be discharged on 7/2 per his notes. SW Genelle Bal made aware and informed family d/t family with plans to visit SNFs. Sanford Lindblad, Charlyne Quale

## 2012-10-10 NOTE — H&P (Signed)
Report given to [facility], Charity fundraiser.  PIV removed.

## 2012-10-10 NOTE — Discharge Summary (Addendum)
Physician Discharge Summary  Patient ID: Wanda [name] MRN: [id number] DOB/AGE: 12-Jun-1945 67 y.o.  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 For 2 days family noticed mental status change. Patient's blood pressure was elevated, hospital admission was deemed necessary for further evaluation and treatment. Please see dictated H&P for further details. Patient was seen in consultation by neurology, and critical care medicine. She had a lumbar puncture which was negative for etiology for mental status change, she had imaging of her brain without signs of acute CVA. Patient has a history of medication noncompliance. 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 Transiently she required IV medication, she eventually was converted back to her oral medications. Patient also had exacerbation of her known diastolic dysfunction due to medication noncompliance. It was ultimately determined that patient needed 24-hour care at home, patient's family currently is unable to provide this therefore it has been recommended the patient be transferred to a skilled nursing facility.   The patient's mentation has slowly improved back to her baseline level of function. She does appear very groggy upon awakening however with increased stimuli her mentation returns back to baseline. Patient was found on the floor on 7/1 however there was no obvious injury. At this time patient is felt to be medically stable for discharge to skilled nursing facility.    Consults:   Neurology Pulmonary critical care medicine Significant Diagnostic Studies:Ct Head Wo Contrast  10/03/2012   *RADIOLOGY REPORT*  Clinical Data: Altered mental status.  CT HEAD WITHOUT CONTRAST  Technique:  Contiguous axial images were obtained from the base of the skull through the vertex without contrast.  Comparison: 09/04/2012  Findings: There is atrophy and chronic small vessel disease changes. No acute intracranial abnormality.  Specifically, no hemorrhage, hydrocephalus, mass lesion, acute infarction, or significant intracranial injury.  No acute calvarial abnormality. Visualized paranasal sinuses and mastoids clear.  Orbital soft tissues unremarkable.  IMPRESSION: No acute intracranial abnormality.  Atrophy, chronic microvascular disease.   Original Report Authenticated By: Charlett Nose, M.D.   Dg Chest Port 1 View  10/03/2012   *RADIOLOGY REPORT*  Clinical Data: Altered mental status, shortness of breath.  PORTABLE CHEST - 1 VIEW  Comparison: 09/04/2012  Findings: Cardiomegaly with vascular congestion.  Possible early/slight interstitial edema.  No confluent opacities or effusions.  Stable slight elevation of the left hemidiaphragm.  No acute bony abnormality.  IMPRESSION: Cardiomegaly with vascular congestion.  Question early interstitial edema.   Original Report Authenticated By: Charlett Nose, M.D.   Dg Fluoro Guide Lumbar Puncture  10/03/2012   *RADIOLOGY REPORT*  Clinical Data:  Altered mental status.  DIAGNOSTIC LUMBAR PUNCTURE UNDER FLUOROSCOPIC GUIDANCE  Fluoroscopy time:  1 minute 28 seconds  Technique:  Time-out  procedure was performed.  Informed consent was obtained from the patient prior to the procedure, including potential complications of  headache, allergy, CSF leak and pain.  With the patient prone, the lower back was prepped with Betadine. 1% Lidocaine was used for local anesthesia.  Lumbar puncture was initially attempted on the right at the L2-L3 level but was unsuccessful. Successful lumbar puncture was performed at the L3-L4 level on theright using a 21 gauge needle with return of clear colorless CSF with an opening pressure of 19 cm water measured prone through the needle.   8 ml of CSF were obtained for laboratory studies.  The patient tolerated the procedure well and there were no apparent complications.  IMPRESSION: Diagnostic lumbar puncture performed without any complications.   Original Report Authenticated By: Carey Bullocks, M.D.   Glucose, capillary [[id number]] (Abnormal) Collected: 10/10/12 1146 Glucose-Capillary 113 (H) mg/dL Updated: 54/09/81 1914 Basic metabolic panel [[id number]] (Abnormal) Collected: 10/10/12 1040 Specimen Information: Blood Updated: 10/10/12 1144 Sodium 139 mEq/L Potassium 3.9 mEq/L Chloride 106 mEq/L CO2 24 mEq/L Glucose, Bld 101 (H) mg/dL BUN 28 (H) mg/dL Creatinine, Ser 3.08 (H) mg/dL Calcium 9.1 mg/dL GFR calc non Af Amer 30 (L) mL/min GFR calc Af Amer 35 (L) mL/min  Echocardiogram  Left ventricle: The cavity size was normal. There was moderate concentric hypertrophy. Definity contrast was administered which demonstrates coarse trabeculation at the apex without mural thrombus. Calcified apical false tendon. Systolic function was normal. The estimated ejection fraction was in the range of 55% to 60%. Wall motion was normal; there were no regional wall motion abnormalities. Doppler parameters are consistent withpseudonormal left ventricular relaxation (grade2 diastolic dysfunction). The E/e' ratio is >20, suggesting high LV fillng pressure.      Discharge  Exam: Blood pressure 191/78, pulse 54, temperature 98.5 F (36.9 C), temperature source Oral, resp. rate 18, height 5\' 1"  (1.549 m), weight 91.1 kg (200 lb 13.4 oz), SpO2 97.00%. General appearance: pleasant, no apparent distress, she does have slowed mentation Resp: clear to auscultation bilaterally Cardio: regular rate and rhythm Extremities: extremities normal, atraumatic, no cyanosis or edema Neurologic: Grossly normal  Disposition: skilled nursing facility     Medication List    STOP taking these medications       cyclobenzaprine 5 MG tablet  Commonly known as:  FLEXERIL      TAKE these medications       albuterol 108 (90 BASE) MCG/ACT inhaler  Commonly known as:  PROVENTIL HFA;VENTOLIN HFA  Inhale 2 puffs into the lungs every 6 (six) hours as needed for wheezing or shortness of breath.     aspirin EC 81 MG tablet  Take 81 mg by mouth daily.     atorvastatin 10 MG tablet  Commonly known as:  LIPITOR  Take 1 tablet (10 mg total) by mouth daily at 6 PM.     brimonidine 0.15 % ophthalmic solution  Commonly known as:  ALPHAGAN  Place 1 drop into both eyes 2 (two) times daily.     clopidogrel 75 MG tablet  Commonly known as:  PLAVIX  Take 75 mg by mouth every morning.     ferrous sulfate 325 (65 FE) MG tablet  Take 325 mg by mouth 2 (two) times daily.     Fluticasone-Salmeterol 100-50 MCG/DOSE Aepb  Commonly known as:  ADVAIR  Inhale 1 puff into the lungs every 12 (twelve) hours.     furosemide 40 MG tablet  Commonly known as:  LASIX  Take 1 tablet (40 mg total) by mouth daily.     hydrALAZINE 100 MG tablet  Commonly known as:  APRESOLINE  Take 1 tablet (100 mg total) by mouth every 8 (eight) hours.     insulin glargine 100 UNIT/ML injection  Commonly known as:  LANTUS  Inject 50 Units into the skin daily after breakfast.     insulin lispro 100 UNIT/ML injection  Commonly known as:  HUMALOG  Inject 2-14 Units into the skin 3 (three) times daily between  meals as needed. sliding scale-150-175=2 units, 176-200=4 units, 201-225=6 units, 226-250=8 units, 251-300=10 units, 301-350=12 units & >350=14 units & contact doctor     ipratropium-albuterol 0.5-2.5 (3) MG/3ML Soln  Commonly known as:  DUONEB  Take 3 mLs by nebulization 3 (three) times daily.     isosorbide mononitrate 60 MG 24 hr tablet  Commonly known as:  IMDUR  Take 1 tablet (60 mg total) by mouth daily.     levothyroxine 100 MCG tablet  Commonly known as:  SYNTHROID, LEVOTHROID  Take 100 mcg by mouth daily before breakfast.     losartan 25 MG tablet  Commonly known as:  COZAAR  Take 1 tablet (25 mg total) by mouth daily.     LUMIGAN 0.01 % Soln  Generic drug:  bimatoprost  Place 1 drop into both eyes at bedtime.     magnesium 30 MG tablet  Take 30 mg by mouth 2 (two) times daily.     metolazone 5 MG tablet  Commonly known as:  ZAROXOLYN  Take 5 mg by mouth daily as needed. Only takes for a weight gain of 5lbs     nebivolol 5 MG tablet  Commonly known as:  BYSTOLIC  Take 2 tablets (10 mg total) by mouth daily.     nitroGLYCERIN 0.4 MG SL tablet  Commonly known as:  NITROSTAT  Place 0.4 mg under the tongue every 5 (five) minutes as needed for chest pain.     omeprazole 20 MG capsule  Commonly known as:  PRILOSEC  Take 20 mg by mouth every morning.     potassium chloride SA 20 MEQ tablet  Commonly known as:  K-DUR,KLOR-CON  Take 1 tablet (20 mEq total) by mouth 2 (two) times daily.     REFRESH OPTIVE ADVANCED 0.5-1-0.5 % Soln  Generic drug:  Carboxymeth-Glycerin-Polysorb  Place 1 drop into both eyes 3 (three) times daily.     senna 8.6 MG Tabs  Commonly known as:  SENOKOT  Take 1 tablet by mouth.         Signed: Daaiyah Baumert D 10/10/2012, 12:27 PM  35 minutes were spent in the coronation of discharge of this patient. Discussing case with patient and family and medication reconciliation.

## 2012-10-10 NOTE — Progress Notes (Signed)
Called and spoke with Dr. Nehemiah Settle regarding patients d/c plan and disposition. Stated patient is medically ready for discharge. Spoke with Genelle Bal SW to confirm that patient has a bed available at [facility]. Erie Noe will contact patients family to make aware of pending d/c today.  Called Dr. Nehemiah Settle and  Informed.   Aniket Paye, Charlyne Quale

## 2012-10-15 ENCOUNTER — Encounter: Payer: Self-pay | Admitting: Nurse Practitioner

## 2012-10-15 ENCOUNTER — Non-Acute Institutional Stay (SKILLED_NURSING_FACILITY): Payer: PRIVATE HEALTH INSURANCE | Admitting: Nurse Practitioner

## 2012-10-15 DIAGNOSIS — I2510 Atherosclerotic heart disease of native coronary artery without angina pectoris: Secondary | ICD-10-CM

## 2012-10-15 DIAGNOSIS — I509 Heart failure, unspecified: Secondary | ICD-10-CM

## 2012-10-15 DIAGNOSIS — I160 Hypertensive urgency: Secondary | ICD-10-CM

## 2012-10-15 DIAGNOSIS — E039 Hypothyroidism, unspecified: Secondary | ICD-10-CM

## 2012-10-15 DIAGNOSIS — N183 Chronic kidney disease, stage 3 unspecified: Secondary | ICD-10-CM

## 2012-10-15 DIAGNOSIS — E119 Type 2 diabetes mellitus without complications: Secondary | ICD-10-CM

## 2012-10-15 DIAGNOSIS — I5032 Chronic diastolic (congestive) heart failure: Secondary | ICD-10-CM

## 2012-10-15 DIAGNOSIS — I10 Essential (primary) hypertension: Secondary | ICD-10-CM

## 2012-10-15 MED ORDER — ATORVASTATIN CALCIUM 10 MG PO TABS: 10.0000 mg | ORAL_TABLET | Freq: Every day | ORAL | Status: DC

## 2012-10-15 MED ORDER — POTASSIUM CHLORIDE CRYS ER 20 MEQ PO TBCR: 20.0000 meq | EXTENDED_RELEASE_TABLET | Freq: Two times a day (BID) | ORAL | Status: AC

## 2012-10-15 MED ORDER — OMEPRAZOLE 20 MG PO CPDR: 20.0000 mg | DELAYED_RELEASE_CAPSULE | Freq: Every day | ORAL | Status: AC

## 2012-10-15 MED ORDER — FLUTICASONE-SALMETEROL 100-50 MCG/DOSE IN AEPB: 1.0000 | INHALATION_SPRAY | Freq: Two times a day (BID) | RESPIRATORY_TRACT | Status: AC

## 2012-10-15 MED ORDER — MAGNESIUM 30 MG PO TABS: 30.0000 mg | ORAL_TABLET | Freq: Two times a day (BID) | ORAL | Status: AC

## 2012-10-15 MED ORDER — LOSARTAN POTASSIUM 25 MG PO TABS: 25.0000 mg | ORAL_TABLET | Freq: Every day | ORAL | Status: AC

## 2012-10-15 MED ORDER — NITROGLYCERIN 0.4 MG SL SUBL: 0.4000 mg | SUBLINGUAL_TABLET | SUBLINGUAL | Status: AC | PRN

## 2012-10-15 MED ORDER — HYDRALAZINE HCL 100 MG PO TABS: 100.0000 mg | ORAL_TABLET | Freq: Three times a day (TID) | ORAL | Status: AC

## 2012-10-15 MED ORDER — METOLAZONE 5 MG PO TABS: 5.0000 mg | ORAL_TABLET | Freq: Every day | ORAL | Status: AC | PRN

## 2012-10-15 MED ORDER — CARBOXYMETH-GLYCERIN-POLYSORB 0.5-1-0.5 % OP SOLN: 1.0000 [drp] | Freq: Three times a day (TID) | OPHTHALMIC | Status: AC

## 2012-10-15 MED ORDER — FERROUS SULFATE 325 (65 FE) MG PO TABS: 325.0000 mg | ORAL_TABLET | Freq: Two times a day (BID) | ORAL | Status: AC

## 2012-10-15 MED ORDER — ISOSORBIDE MONONITRATE ER 60 MG PO TB24: 60.0000 mg | ORAL_TABLET | Freq: Every day | ORAL | Status: AC

## 2012-10-15 MED ORDER — INSULIN LISPRO 100 UNIT/ML ~~LOC~~ SOLN: 5.0000 [IU] | Freq: Three times a day (TID) | SUBCUTANEOUS | Status: AC | PRN

## 2012-10-15 MED ORDER — SENNA 8.6 MG PO TABS: 1.0000 | ORAL_TABLET | Freq: Every day | ORAL | Status: AC | PRN

## 2012-10-15 MED ORDER — ALBUTEROL SULFATE HFA 108 (90 BASE) MCG/ACT IN AERS: 2.0000 | INHALATION_SPRAY | Freq: Four times a day (QID) | RESPIRATORY_TRACT | Status: AC | PRN

## 2012-10-15 MED ORDER — BIMATOPROST 0.01 % OP SOLN: 1.0000 [drp] | Freq: Every day | OPHTHALMIC | Status: AC

## 2012-10-15 MED ORDER — ASPIRIN EC 81 MG PO TBEC: 81.0000 mg | DELAYED_RELEASE_TABLET | Freq: Every day | ORAL | Status: AC

## 2012-10-15 MED ORDER — CLOPIDOGREL BISULFATE 75 MG PO TABS: 75.0000 mg | ORAL_TABLET | Freq: Every day | ORAL | Status: AC

## 2012-10-15 MED ORDER — IPRATROPIUM-ALBUTEROL 0.5-2.5 (3) MG/3ML IN SOLN: 3.0000 mL | Freq: Three times a day (TID) | RESPIRATORY_TRACT | Status: AC

## 2012-10-15 MED ORDER — FUROSEMIDE 40 MG PO TABS: 40.0000 mg | ORAL_TABLET | Freq: Every day | ORAL | Status: AC

## 2012-10-15 MED ORDER — BRIMONIDINE TARTRATE 0.15 % OP SOLN: 1.0000 [drp] | Freq: Two times a day (BID) | OPHTHALMIC | Status: AC

## 2012-10-15 MED ORDER — NEBIVOLOL HCL 5 MG PO TABS: 10.0000 mg | ORAL_TABLET | Freq: Every day | ORAL | Status: AC

## 2012-10-15 MED ORDER — LEVOTHYROXINE SODIUM 100 MCG PO TABS: 100.0000 ug | ORAL_TABLET | Freq: Every day | ORAL | Status: AC

## 2012-10-15 MED ORDER — INSULIN GLARGINE 100 UNIT/ML ~~LOC~~ SOLN: 50.0000 [IU] | Freq: Every day | SUBCUTANEOUS | Status: AC

## 2012-10-15 NOTE — Progress Notes (Signed)
Patient ID: Wanda [name], female   DOB: 1945-07-27, 67 y.o.   MRN: [id number]    PCP: Katy Apo, MD   Allergies  Allergen Reactions  . Blue Dyes (Parenteral) Swelling       . Other     Cardiologist told patient not to take anything that affects "QT"  . Shrimp (Shellfish Allergy)     ONLY had a reaction once (face swelled up)---has been able to eat shrimp and shellfish every other time   . Erythromycin Palpitations    Chief Complaint: admission and discharge review   HPI:  67 year old african [place] female who presented to the hospital with mental status change. For 2 days family noticed mental status change. Pt was visited family from out of town Patient's blood pressure was elevated, hospital admission was deemed necessary for further evaluation and treatment. Patient was seen in consultation by neurology, and critical care medicine. She had a lumbar puncture which was negative for etiology for mental status change, she had imaging of her brain without signs of acute CVA. Patient has a history of medication noncompliance. 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 Transiently she required IV medication, she eventually was converted back to her oral medications. Patient also had exacerbation of her known diastolic dysfunction due to medication noncompliance. The patient's mentation has slowly improved back to her baseline level of function. Pt was discharged to [facility] for STR however her sisters are ready for her to return home to [place] where she lives with her daughter. Patient currently doing well with therapy and mentation is back to baseline. She is now stable to discharge home with home health with 24 hour care with daughters and home health therapies   Review of Systems:  Review of Systems  Constitutional: Negative for fever and chills.  Eyes: Negative.   Respiratory: Negative for cough and shortness of  breath.   Cardiovascular: Negative for chest pain and leg swelling.  Gastrointestinal: Negative for heartburn, abdominal pain, diarrhea and constipation.  Genitourinary: Negative for dysuria, urgency and frequency.  Musculoskeletal: Negative for myalgias.  Skin: Negative for itching and rash.  Neurological: Negative for dizziness and headaches.  Psychiatric/Behavioral: Negative for depression. The patient is not nervous/anxious.      Past Medical History  Diagnosis Date  . Palpitations   . HTN (hypertension)   . CAD (coronary artery disease)   . Bigeminy   . Obesity   . Adverse effect of anesthesia 05/2009    MI during cardiac  stent placement  . Angina   . Myocardial infarction 05/2009  . Heart murmur   . Asthma   . Hypothyroidism     "started med 01/2011"  . Blood transfusion     "not related to OR" (09/05/2012)  . CHF (congestive heart failure)   . Recurrent upper respiratory infection (URI)     "colds every year"  . Hiatal hernia   . Depression      "lost son 03/2010"  . GERD (gastroesophageal reflux disease)   . 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 Atrial fibrillation   . Chronic kidney disease (CKD), stage III (moderate)     Hattie Perch 09/04/2012 (09/04/2012)  . PONV (postoperative nausea and vomiting)   . Pneumonia     'a couple times; last time 05/2012" (09/05/2012)  . Exertional shortness of breath   . Type II diabetes mellitus   . Iron deficiency anemia 02/14/11     "  I have taken shots for it; I take iron pills now"  . Spinal headache   . Headache     "only related to my blood pressure" (09/05/2012)  . Arthritis     "right arm" (09/05/2012)  . Chronic lower back pain    Past Surgical History  Procedure Laterality Date  . Video assisted thoracoscopy (vats)/ lobectomy Left 04/2010  . Tubal ligation    . Abdominal hysterectomy  ~ 1986  . Laparoscopic salpingoopherectomy Bilateral post 1986  . Refractive surgery  ~ 2009  . Dilation and  curettage of uterus    . Loop recorder implant  03/01/12    [employer]  . Coronary angioplasty with stent placement  05/2009     "had MI during this"  . Coronary angioplasty with stent placement  2010   Social History:   reports that she quit smoking about 14 years ago. Her smoking use included Cigarettes. She has a 3 pack-year smoking history. She has never used smokeless tobacco. She reports that she uses illicit drugs (Marijuana). She reports that she does not drink alcohol.  Family History  Problem Relation Age of Onset  . Cancer      Medications: Patient's Medications  New Prescriptions   No medications on file  Previous Medications   No medications on file  Modified Medications   Modified Medication Previous Medication   ALBUTEROL (PROVENTIL HFA;VENTOLIN HFA) 108 (90 BASE) MCG/ACT INHALER albuterol (PROVENTIL HFA;VENTOLIN HFA) 108 (90 BASE) MCG/ACT inhaler      Inhale 2 puffs into the lungs every 6 (six) hours as needed for wheezing or shortness of breath.    Inhale 2 puffs into the lungs every 6 (six) hours as needed for wheezing or shortness of breath.    ASPIRIN EC 81 MG TABLET aspirin EC 81 MG tablet      Take 1 tablet (81 mg total) by mouth daily.    Take 81 mg by mouth daily.   ATORVASTATIN (LIPITOR) 10 MG TABLET atorvastatin (LIPITOR) 10 MG tablet      Take 1 tablet (10 mg total) by mouth daily at 6 PM.    Take 1 tablet (10 mg total) by mouth daily at 6 PM.   BIMATOPROST (LUMIGAN) 0.01 % SOLN Bimatoprost (LUMIGAN) 0.01 % SOLN      Place 1 drop into both eyes at bedtime.    Place 1 drop into both eyes at bedtime.    BRIMONIDINE (ALPHAGAN) 0.15 % OPHTHALMIC SOLUTION brimonidine (ALPHAGAN) 0.15 % ophthalmic solution      Place 1 drop into both eyes 2 (two) times daily.    Place 1 drop into both eyes 2 (two) times daily.    CARBOXYMETH-GLYCERIN-POLYSORB (REFRESH OPTIVE ADVANCED) 0.5-1-0.5 % SOLN Carboxymeth-Glycerin-Polysorb (REFRESH OPTIVE ADVANCED) 0.5-1-0.5 % SOLN       Place 1 drop into both eyes 3 (three) times daily.    Place 1 drop into both eyes 3 (three) times daily.   CLOPIDOGREL (PLAVIX) 75 MG TABLET clopidogrel (PLAVIX) 75 MG tablet      Take 1 tablet (75 mg total) by mouth daily.    Take 75 mg by mouth every morning.    FERROUS SULFATE 325 (65 FE) MG TABLET ferrous sulfate 325 (65 FE) MG tablet      Take 1 tablet (325 mg total) by mouth 2 (two) times daily.    Take 325 mg by mouth 2 (two) times daily.     FLUTICASONE-SALMETEROL (ADVAIR) 100-50 MCG/DOSE AEPB Fluticasone-Salmeterol (ADVAIR) 100-50 MCG/DOSE AEPB  Inhale 1 puff into the lungs every 12 (twelve) hours.    Inhale 1 puff into the lungs every 12 (twelve) hours.   FUROSEMIDE (LASIX) 40 MG TABLET furosemide (LASIX) 40 MG tablet      Take 1 tablet (40 mg total) by mouth daily.    Take 1 tablet (40 mg total) by mouth daily.   HYDRALAZINE (APRESOLINE) 100 MG TABLET hydrALAZINE (APRESOLINE) 100 MG tablet      Take 1 tablet (100 mg total) by mouth 3 (three) times daily.    Take 1 tablet (100 mg total) by mouth every 8 (eight) hours.   INSULIN GLARGINE (LANTUS) 100 UNIT/ML INJECTION insulin glargine (LANTUS) 100 UNIT/ML injection      Inject 0.5 mLs (50 Units total) into the skin daily after breakfast.    Inject 50 Units into the skin daily after breakfast.    INSULIN LISPRO (HUMALOG) 100 UNIT/ML INJECTION insulin lispro (HUMALOG) 100 UNIT/ML injection      Inject 5 Units into the skin 3 (three) times daily between meals as needed. For blood sugars greater than 150 with meals    Inject 2-14 Units into the skin 3 (three) times daily between meals as needed. sliding scale-150-175=2 units, 176-200=4 units, 201-225=6 units, 226-250=8 units, 251-300=10 units, 301-350=12 units & >350=14 units & contact doctor   IPRATROPIUM-ALBUTEROL (DUONEB) 0.5-2.5 (3) MG/3ML SOLN ipratropium-albuterol (DUONEB) 0.5-2.5 (3) MG/3ML SOLN      Take 3 mLs by nebulization 3 (three) times daily.    Take 3 mLs by nebulization 3  (three) times daily.    ISOSORBIDE MONONITRATE (IMDUR) 60 MG 24 HR TABLET isosorbide mononitrate (IMDUR) 60 MG 24 hr tablet      Take 1 tablet (60 mg total) by mouth daily.    Take 1 tablet (60 mg total) by mouth daily.   LEVOTHYROXINE (SYNTHROID, LEVOTHROID) 100 MCG TABLET levothyroxine (SYNTHROID, LEVOTHROID) 100 MCG tablet      Take 1 tablet (100 mcg total) by mouth daily before breakfast.    Take 100 mcg by mouth daily before breakfast.   LOSARTAN (COZAAR) 25 MG TABLET losartan (COZAAR) 25 MG tablet      Take 1 tablet (25 mg total) by mouth daily.    Take 1 tablet (25 mg total) by mouth daily.   MAGNESIUM 30 MG TABLET magnesium 30 MG tablet      Take 1 tablet (30 mg total) by mouth 2 (two) times daily.    Take 30 mg by mouth 2 (two) times daily.   METOLAZONE (ZAROXOLYN) 5 MG TABLET metolazone (ZAROXOLYN) 5 MG tablet      Take 1 tablet (5 mg total) by mouth daily as needed. Only takes for a weight gain of 5lbs    Take 5 mg by mouth daily as needed. Only takes for a weight gain of 5lbs   NEBIVOLOL (BYSTOLIC) 5 MG TABLET nebivolol (BYSTOLIC) 5 MG tablet      Take 2 tablets (10 mg total) by mouth daily.    Take 2 tablets (10 mg total) by mouth daily.   NITROGLYCERIN (NITROSTAT) 0.4 MG SL TABLET nitroGLYCERIN (NITROSTAT) 0.4 MG SL tablet      Place 1 tablet (0.4 mg total) under the tongue every 5 (five) minutes as needed for chest pain.    Place 0.4 mg under the tongue every 5 (five) minutes as needed for chest pain.    OMEPRAZOLE (PRILOSEC) 20 MG CAPSULE omeprazole (PRILOSEC) 20 MG capsule      Take 1 capsule (20  mg total) by mouth daily.    Take 20 mg by mouth every morning.    POTASSIUM CHLORIDE SA (K-DUR,KLOR-CON) 20 MEQ TABLET potassium chloride SA (K-DUR,KLOR-CON) 20 MEQ tablet      Take 1 tablet (20 mEq total) by mouth 2 (two) times daily.    Take 1 tablet (20 mEq total) by mouth 2 (two) times daily.   SENNA (SENOKOT) 8.6 MG TABS senna (SENOKOT) 8.6 MG TABS      Take 1 tablet (8.6 mg total)  by mouth daily as needed.    Take 1 tablet by mouth.  Discontinued Medications   No medications on file     Physical Exam:  Filed Vitals:   10/15/12 1408  BP: 160/64  Pulse: 70  Temp: 97.4 F (36.3 C)  Resp: 20   Physical Exam  Constitutional: She is well-developed, well-nourished, and in no distress. No distress.  HENT:  Head: Normocephalic and atraumatic.  Mouth/Throat: Oropharynx is clear and moist. No oropharyngeal exudate.  Eyes: Conjunctivae and EOM are normal. Pupils are equal, round, and reactive to light.  Neck: Normal range of motion. Neck supple. No JVD present. No thyromegaly present.  Cardiovascular: Normal rate, regular rhythm and normal heart sounds.   Pulmonary/Chest: Effort normal and breath sounds normal. No respiratory distress.  Abdominal: Soft. Bowel sounds are normal. She exhibits no distension.  Musculoskeletal: Normal range of motion. She exhibits no edema and no tenderness.  Generalized weakness- uses the WC and walker  Lymphadenopathy:    She has no cervical adenopathy.  Neurological: She is alert.  Skin: Skin is warm and dry. She is not diaphoretic.  Psychiatric: Affect normal.      Labs reviewed: Basic Metabolic Panel:  Recent Labs  09/60/45 0933  05/23/12 0924  10/02/12 2352  10/06/12 0620 10/07/12 0540 10/10/12 1040  NA  --   < > 141  < >  --   < > 144 140 139  K  --   < > 4.0  < >  --   < > 4.0 4.1 3.9  CL  --   < > 107  < >  --   < > 112 109 106  CO2  --   < > 20  < >  --   < > 22 23 24   GLUCOSE  --   < > 249*  < >  --   < > 71 96 101*  BUN  --   < > 46*  < >  --   < > 24* 27* 28*  CREATININE  --   < > 2.07*  < >  --   < > 2.02* 1.84* 1.70*  CALCIUM  --   < > 9.9  < >  --   < > 9.1 9.2 9.1  MG 2.1  --   --   --  1.9  --   --   --   --   PHOS  --   --  3.6  --   --   --   --   --   --   < > = values in this interval not displayed. Liver Function Tests:  Recent Labs  05/20/12 1502 05/23/12 0924 09/04/12 1447 10/02/12 2034   AST 11  --  14 20  ALT 9  --  21 18  ALKPHOS 142*  --  115 109  BILITOT 0.3  --  0.5 0.3  PROT 7.4  --  6.8 6.5  ALBUMIN 3.5 3.3* 3.3* 3.1*   No results found for this basename: LIPASE, AMYLASE,  in the last 8760 hours  Recent Labs  10/02/12 2352 10/03/12 0814  AMMONIA 41 34   CBC:  Recent Labs  05/20/12 1502  06/02/12 1156  07/24/12 1854 09/04/12 1447 10/02/12 2034  WBC 6.5  < > 7.5  < > 5.9 7.1 6.1  NEUTROABS 4.2  --  4.9  --   --  4.6  --   HGB 11.9*  < > 13.9  < > 11.6* 11.3* 11.9*  HCT 35.5*  < > 39.6  < > 33.8* 34.1* 35.2*  MCV 87.9  < > 85.0  < > 85.4 88.3 87.1  PLT 174  < > 210  < > 209 217 181  < > = values in this interval not displayed. Cardiac Enzymes:  Recent Labs  10/03/12 0813 10/03/12 1405 10/03/12 2001  TROPONINI <0.30 <0.30 <0.30   BNP: No components found with this basename: POCBNP,  CBG:  Recent Labs  10/09/12 2105 10/10/12 0736 10/10/12 1146  GLUCAP 100* 105* 113*    Radiological Exams: Ct Head Wo Contrast  10/03/2012 *RADIOLOGY REPORT* Clinical Data: Altered mental status. CT HEAD WITHOUT CONTRAST Technique: Contiguous axial images were obtained from the base of the skull through the vertex without contrast. Comparison: 09/04/2012 Findings: There is atrophy and chronic small vessel disease changes. No acute intracranial abnormality. Specifically, no hemorrhage, hydrocephalus, mass lesion, acute infarction, or significant intracranial injury. No acute calvarial abnormality. Visualized paranasal sinuses and mastoids clear. Orbital soft tissues unremarkable. IMPRESSION: No acute intracranial abnormality. Atrophy, chronic microvascular disease. Original Report Authenticated By: Charlett Nose, M.D.  Dg Chest Port 1 View  10/03/2012 *RADIOLOGY REPORT* Clinical Data: Altered mental status, shortness of breath. PORTABLE CHEST - 1 VIEW Comparison: 09/04/2012 Findings: Cardiomegaly with vascular congestion. Possible early/slight interstitial edema. No  confluent opacities or effusions. Stable slight elevation of the left hemidiaphragm. No acute bony abnormality. IMPRESSION: Cardiomegaly with vascular congestion. Question early interstitial edema. Original Report Authenticated By: Charlett Nose, M.D.  Dg Fluoro Guide Lumbar Puncture  10/03/2012 *RADIOLOGY REPORT* Clinical Data: Altered mental status. DIAGNOSTIC LUMBAR PUNCTURE UNDER FLUOROSCOPIC GUIDANCE Fluoroscopy time: 1 minute 28 seconds Technique: Time-out procedure was performed. Informed consent was obtained from the patient prior to the procedure, including potential complications of headache, allergy, CSF leak and pain. With the patient prone, the lower back was prepped with Betadine. 1% Lidocaine was used for local anesthesia. Lumbar puncture was initially attempted on the right at the L2-L3 level but was unsuccessful. Successful lumbar puncture was performed at the L3-L4 level on theright using a 21 gauge needle with return of clear colorless CSF with an opening pressure of 19 cm water measured prone through the needle. 8 ml of CSF were obtained for laboratory studies. The patient tolerated the procedure well and there were no apparent complications. IMPRESSION: Diagnostic lumbar puncture performed without any complications. Original Report Authenticated By: Carey Bullocks, M.D.  Glucose, capillary [[id number]] (Abnormal) Collected: 10/10/12 1146 Glucose-Capillary 113 (H) mg/dL Updated: 40/10/27 2536 Basic metabolic panel [[id number]] (Abnormal) Collected: 10/10/12 1040 Specimen Information: Blood Updated: 10/10/12 1144 Sodium 139 mEq/L Potassium 3.9 mEq/L Chloride 106 mEq/L CO2 24 mEq/L Glucose, Bld 101 (H) mg/dL BUN 28 (H) mg/dL Creatinine, Ser 2.59 (H) mg/dL Calcium 9.1 mg/dL GFR calc non Af Amer 30 (L) mL/min GFR calc Af Amer 35 (L) mL/min  Echocardiogram  Left ventricle: The cavity size was normal. There was moderate concentric hypertrophy. Definity contrast was  administered which demonstrates  coarse trabeculation at the apex without mural thrombus. Calcified apical false tendon. Systolic function was normal. The estimated ejection fraction was in the range of 55% to 60%. Wall motion was normal; there were no regional wall motion abnormalities. Doppler parameters are consistent withpseudonormal left ventricular relaxation (grade2 diastolic dysfunction). The E/e' ratio is >20, suggesting high LV fillng pressure.    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 Congestive heart failure, diastolic - stable on current medications  Diabetes mellitus--stable; hemoglobin A1c 6.9 in May of 2014  Hypothyroidism-- cont synthroid 100 mcg Coronary artery disease- stable Known medication noncompliance  Chronic kidney disease stage 3-4, ARB was added during hospitalization- will need continuing monitoring of creatinine clearance  pt is stable for discharge-will need PT/OT per home health. DME needed is a nebulizer.  Rx written.  will need to follow up with PCP once she is back to [place].

## 2012-11-06 ENCOUNTER — Encounter: Payer: Self-pay | Admitting: *Deleted

## 2012-11-11 ENCOUNTER — Other Ambulatory Visit: Payer: Self-pay

## 2012-11-11 MED ORDER — ATORVASTATIN CALCIUM 10 MG PO TABS: 10.0000 mg | ORAL_TABLET | Freq: Every day | ORAL | Status: AC

## 2012-11-11 NOTE — Telephone Encounter (Signed)
Rx was sent to pharmacy electronically. 

## 2013-02-08 IMAGING — US US ABDOMEN COMPLETE
1 series · 13 of 25 positions shown · non-contrast
Comparison: Renal ultrasound 09/12/2010.  PET CT 03/10/2010.

CLINICAL DATA: Chest pain.  History of hypertension, diabetes and
renal failure.

COMPLETE ABDOMINAL ULTRASOUND

[Series 1: us abdomen complete · 0.31mm/px · 84 acquisitions, 13 frames shown]
[im 1/84]
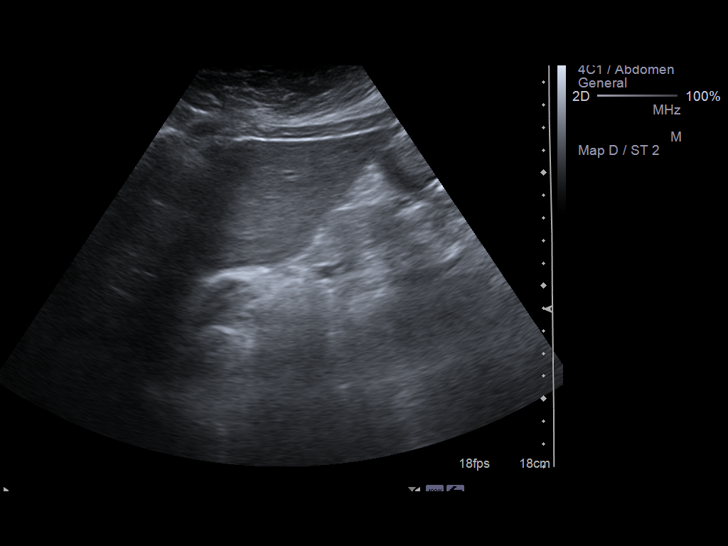
[im 7/84]
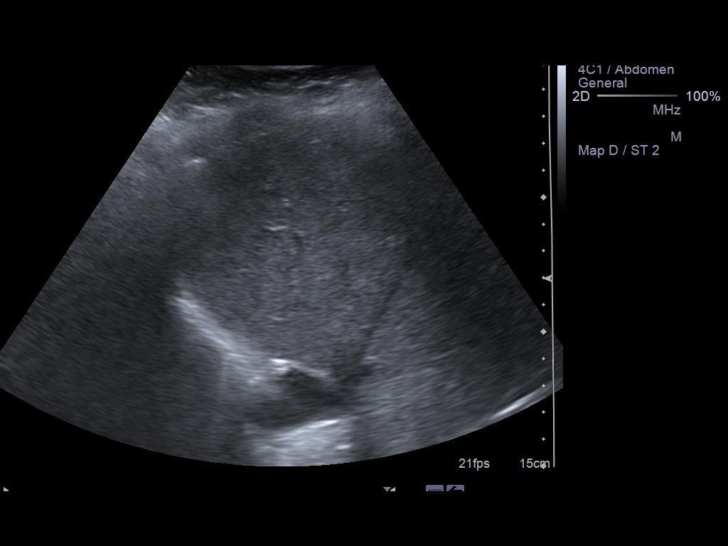
[im 14/84]
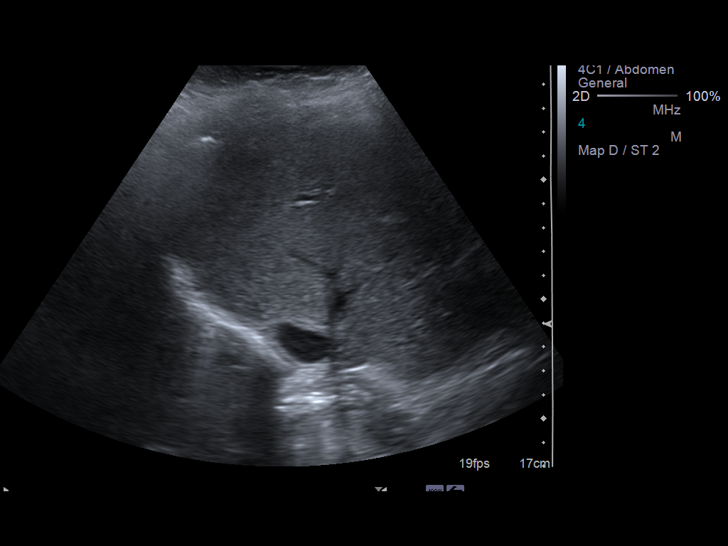
[im 21/84]
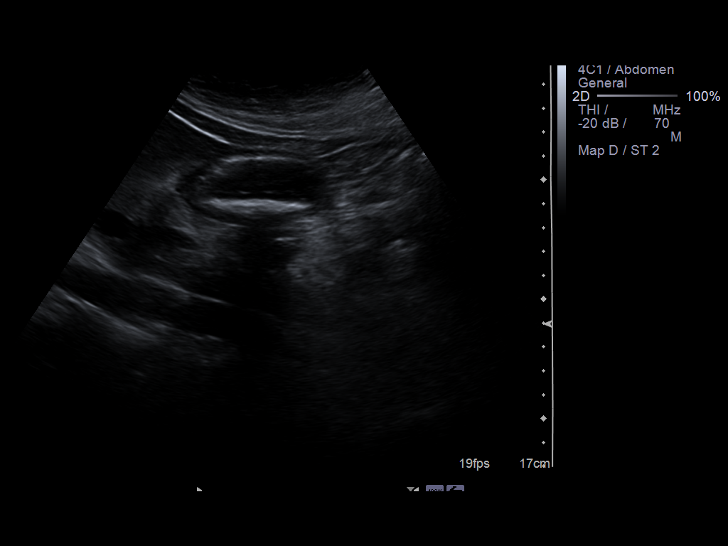
[im 28/84]
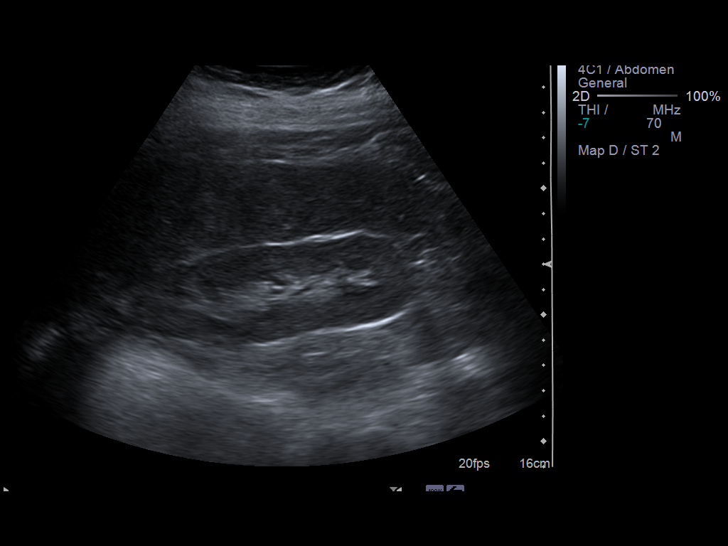
[im 35/84]
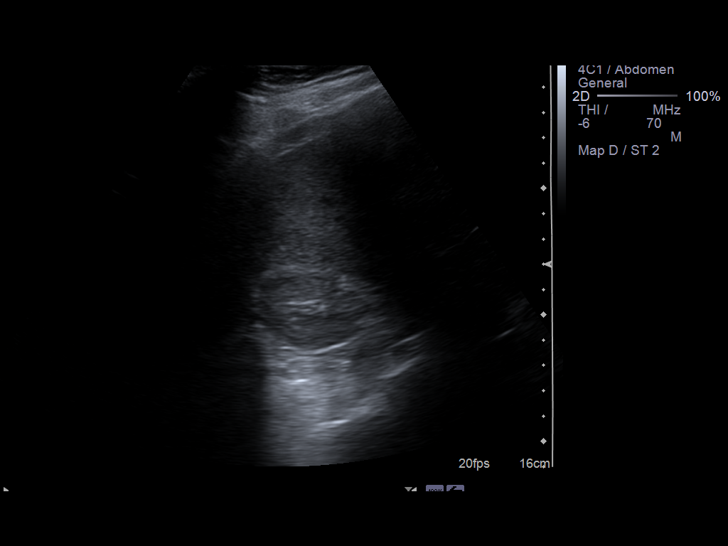
[im 42/84]
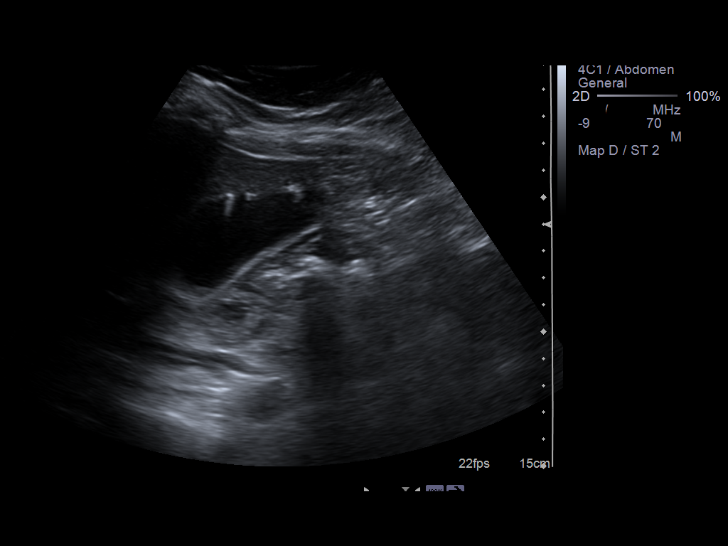
[im 49/84]
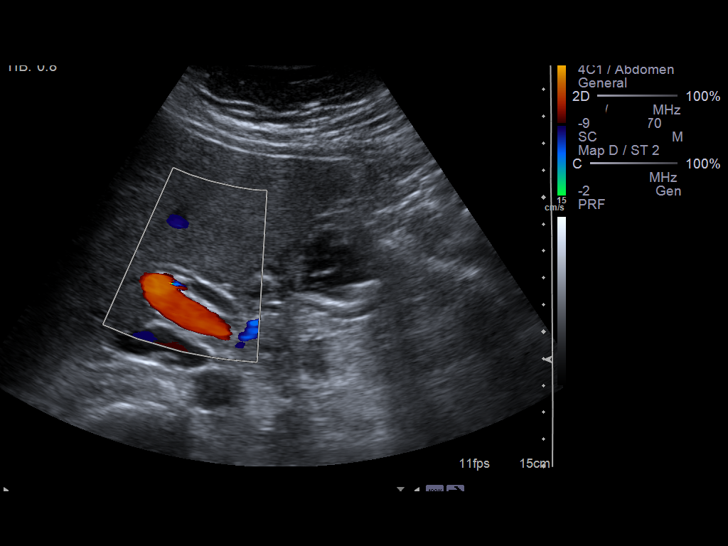
[im 56/84]
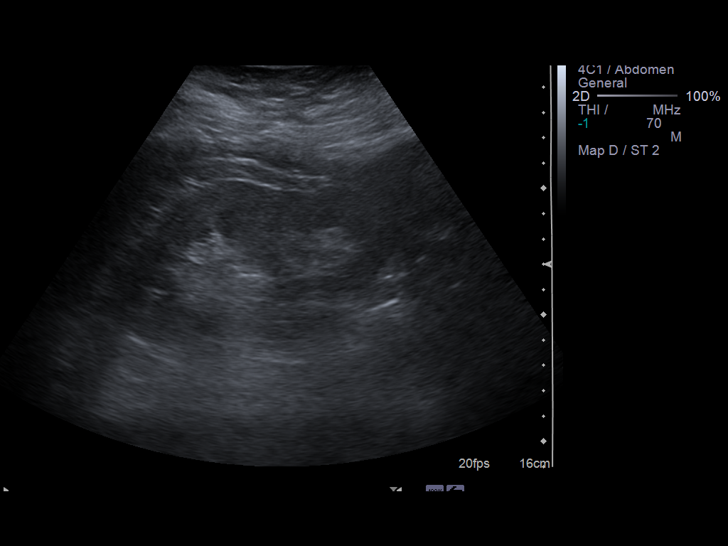
[im 63/84]
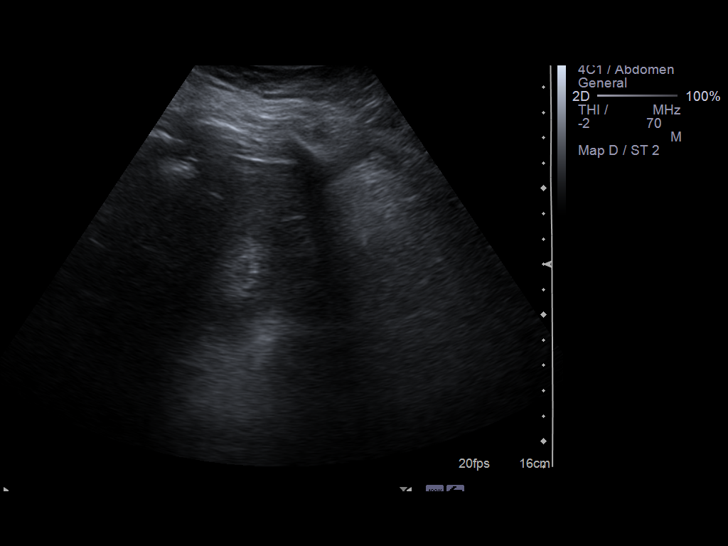
[im 70/84]
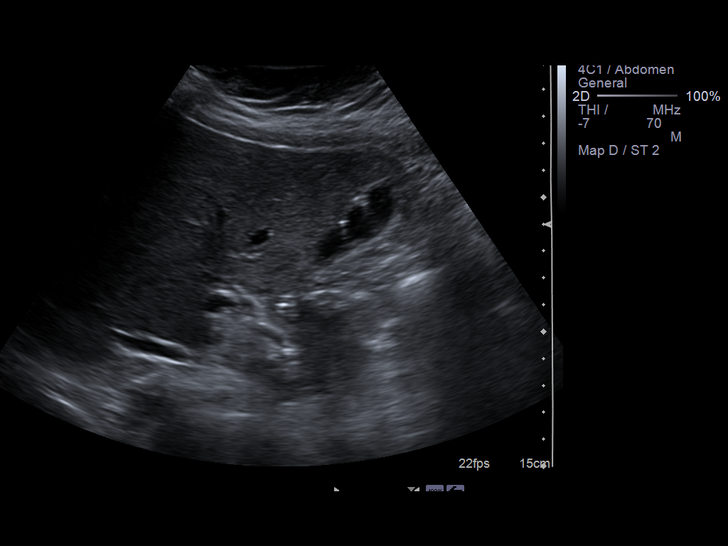
[im 77/84]
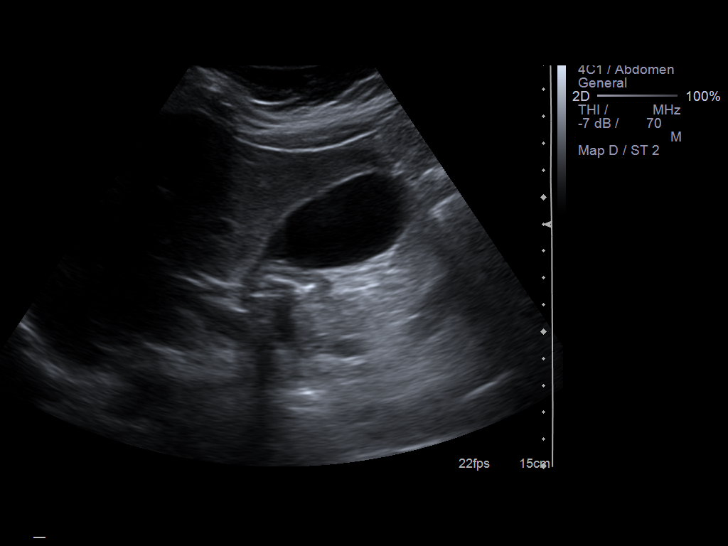
[im 84/84]
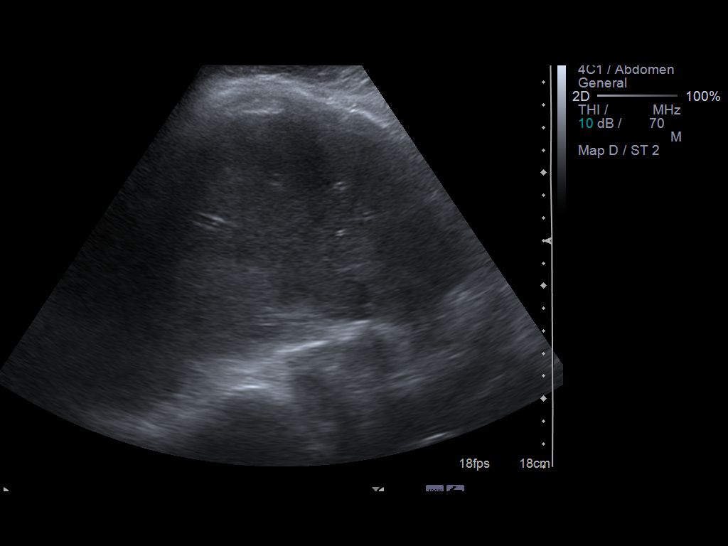

[13 of 25 positions shown; findings below may reference images not displayed]

FINDINGS: Gallbladder: There is a ring down artifact within the gallbladder
wall most suggestive of adenomyomatosis.  There is a probable small
calcification within the gallbladder wall on prior PET CT.  New
movable gallstones are identified.  There is no gallbladder wall
thickening or sonographic Murphy's sign.

Common bile duct:   Normal in caliber without filling defects.

Liver:  Echogenicity is within normal limits.  No focal hepatic
abnormalities are identified.

IVC:  Visualized portions appear unremarkable.

Pancreas:  Visualized portions appear unremarkable.

Spleen:  Visualized portions appear unremarkable.

Right Kidney:  There is persistently increased renal cortical
echogenicity. The renal cortical thickness is preserved.  There is
no hydronephrosis or focal abnormality. Renal length is 12.1 cm.

Left Kidney: There is persistently increased renal cortical
echogenicity.  The renal cortical thickness is preserved.  There is
no hydronephrosis or focal abnormality. Renal length is 12.0 cm.

Abdominal aorta:  Visualized portions appear unremarkable.
IMPRESSION: 1.  No acute abdominal findings identified.
2.  Probable gallbladder adenomyomatosis without discrete stones or
wall thickening.
3.  Stable echogenic kidneys consistent with chronic medical renal
disease.  No hydronephrosis.

## 2013-03-12 ENCOUNTER — Telehealth: Payer: Self-pay | Admitting: Cardiovascular Disease

## 2013-03-12 NOTE — Telephone Encounter (Signed)
Called pt to schedule appt .Marland Kitchen Telephone # d/c

## 2013-09-17 ENCOUNTER — Encounter: Payer: Self-pay | Admitting: *Deleted

## 2013-09-18 ENCOUNTER — Telehealth: Payer: Self-pay | Admitting: Cardiology

## 2013-09-18 NOTE — Telephone Encounter (Signed)
Certified letter sent 

## 2014-03-19 ENCOUNTER — Encounter (HOSPITAL_COMMUNITY): Payer: Self-pay | Admitting: Cardiovascular Disease

## 2014-05-27 IMAGING — CR DG CHEST 2V
2 series · 2 of 2 positions shown · non-contrast
Comparison: 02/27/2012 and 01/03/2011.

CLINICAL DATA: Cough.  Weakness.  History of CHF.  Prior smoker.

CHEST - 2 VIEW

[w chest pa]
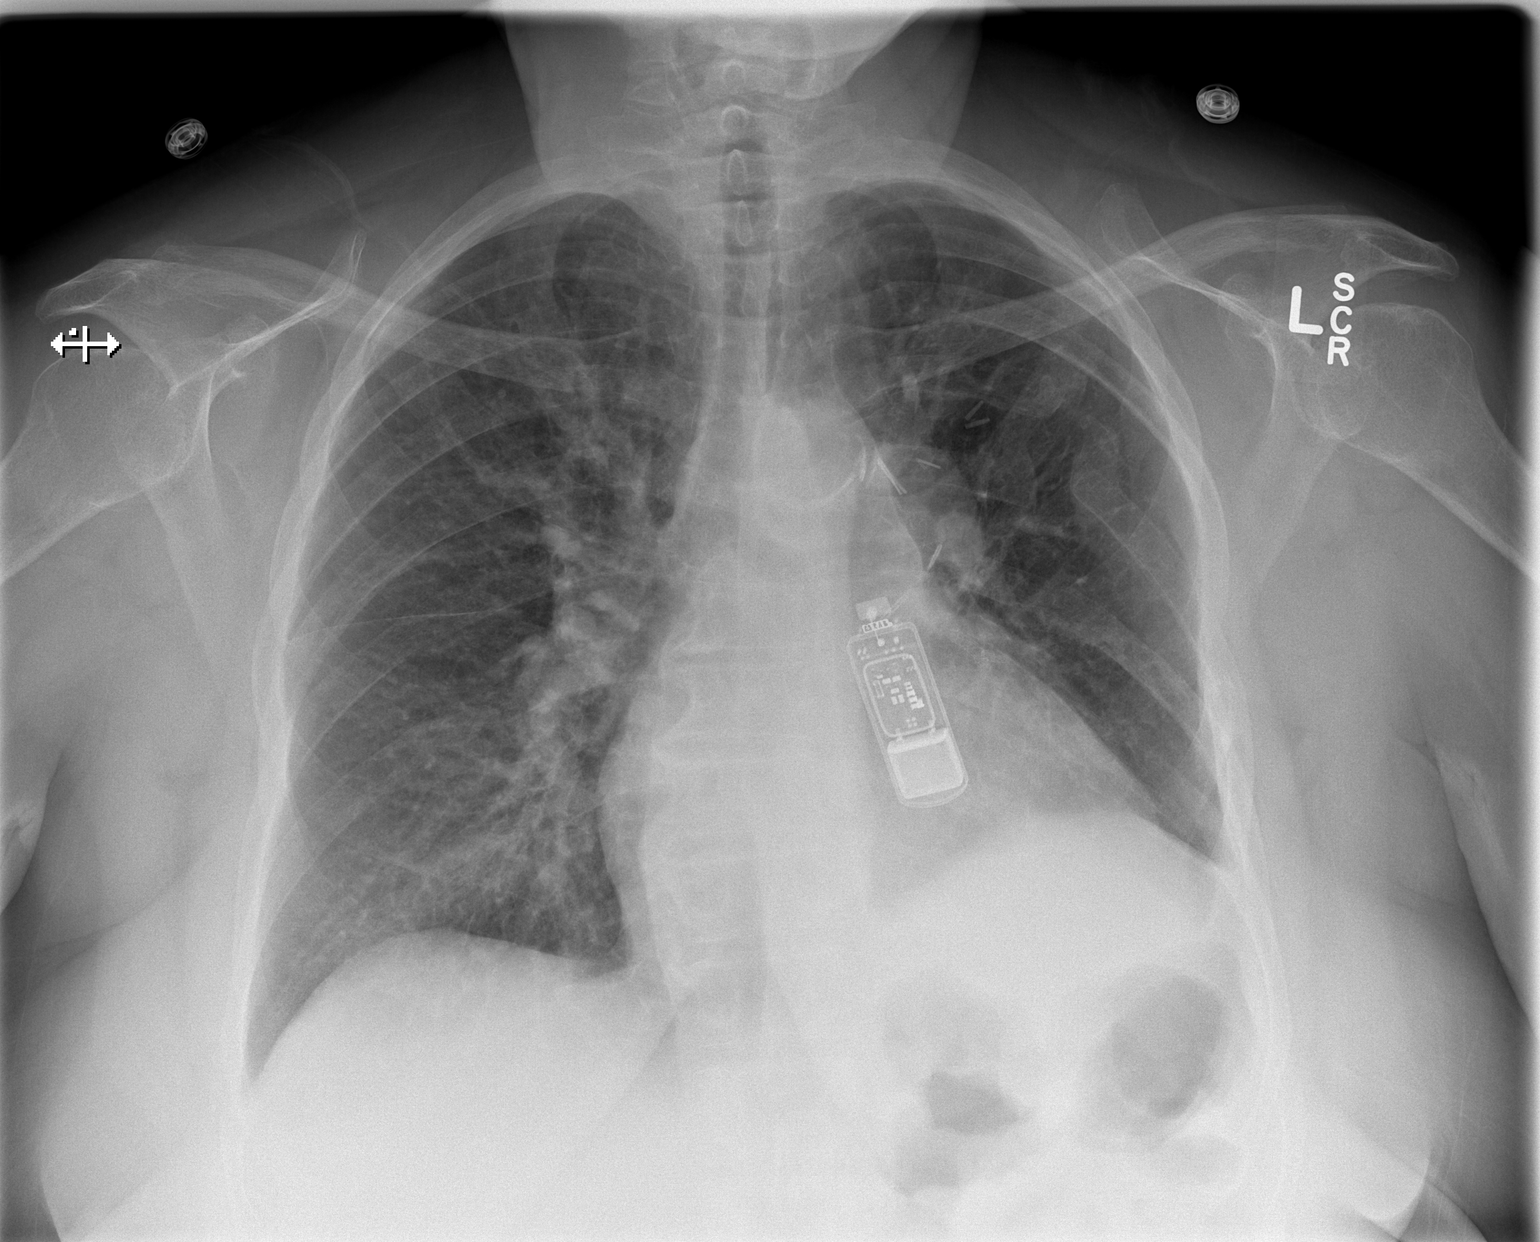

[w chest lat]
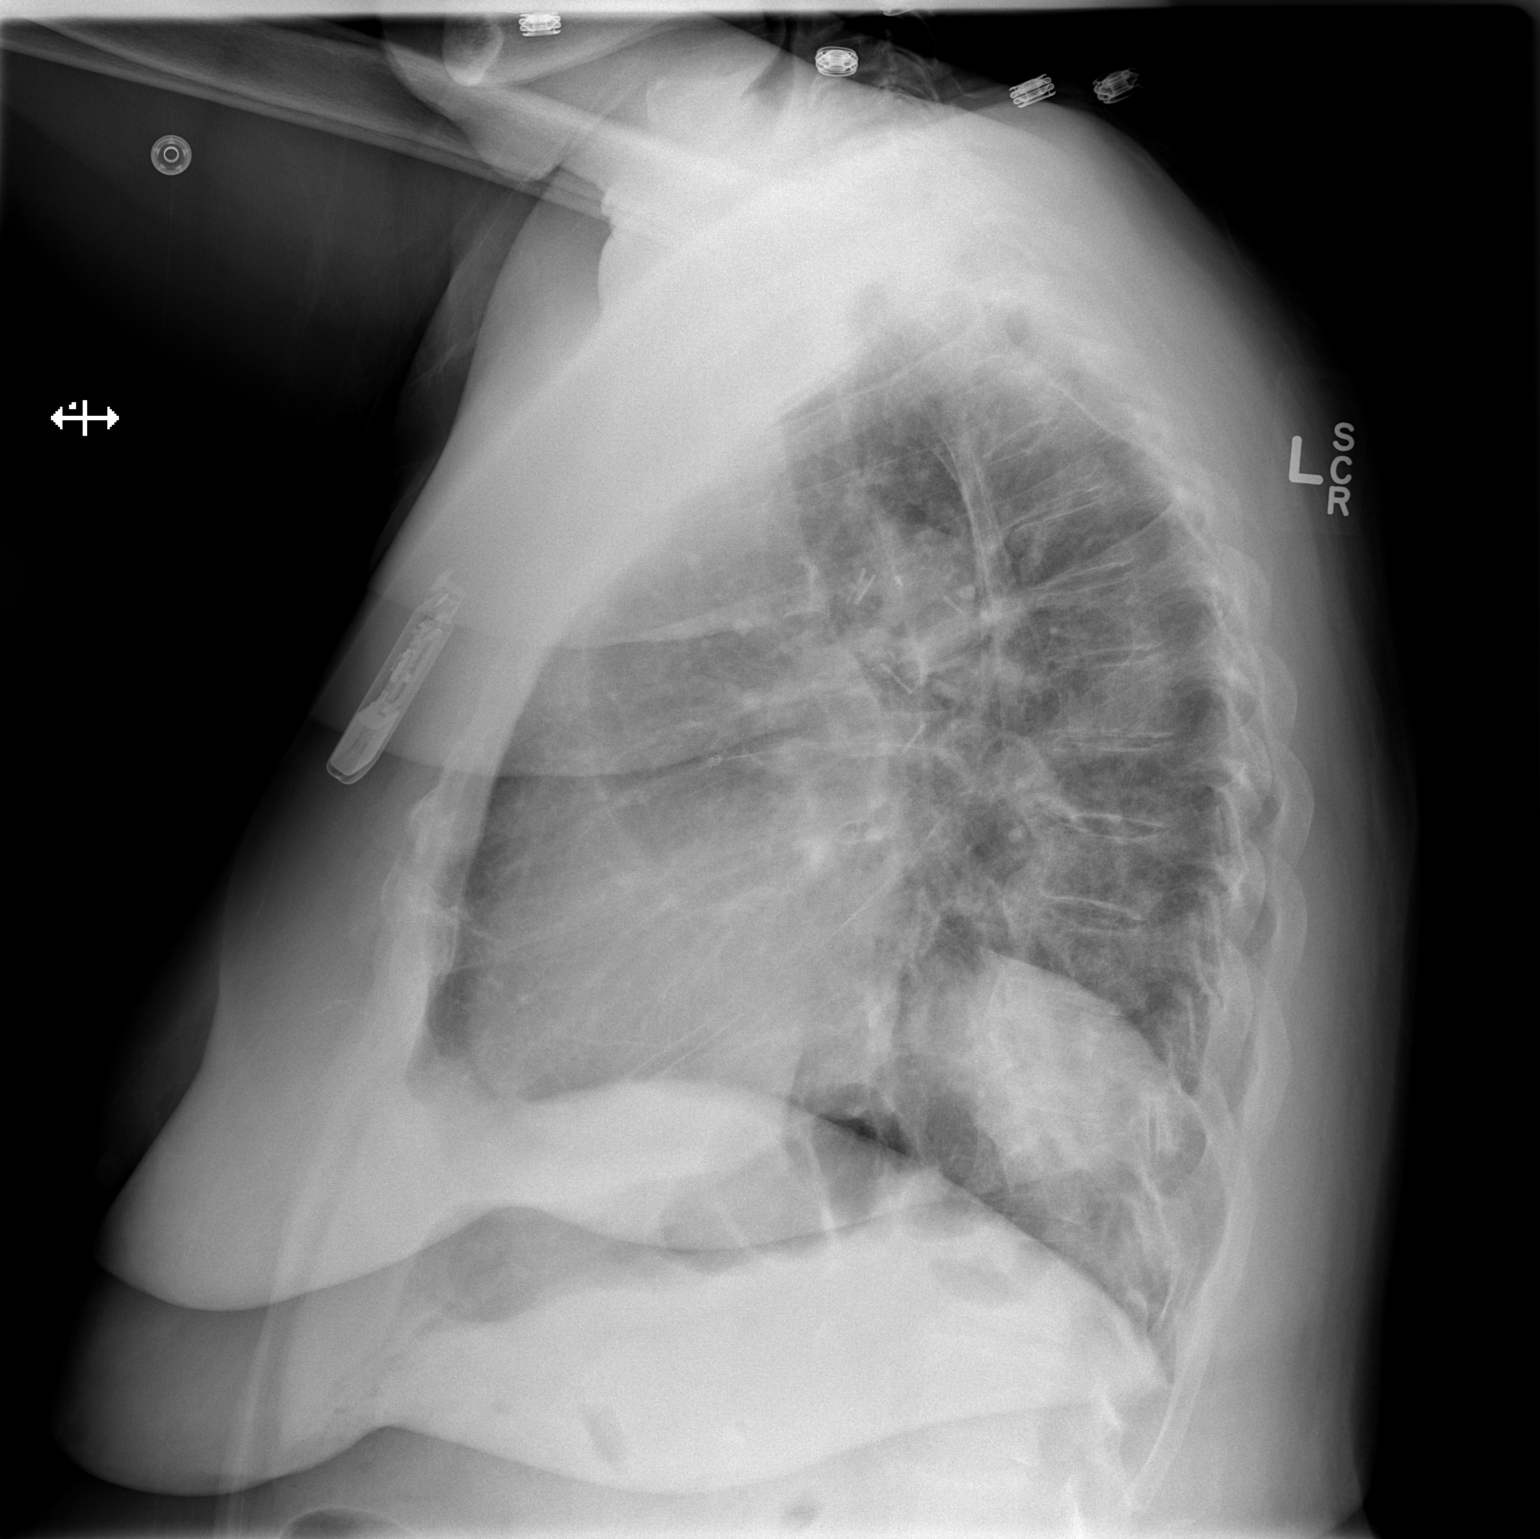

[2 of 2 positions shown; findings below may reference images not displayed]

FINDINGS: Prior left lung surgery with rib deformity and elevation
left hemidiaphragm.  Mild elevation left hilar structures with
similar configuration to prior exams.

Loop recorder is in place projecting over the heart.

Pulmonary vascular congestion similar to the prior exam most
notable centrally.

No segmental consolidation or pneumothorax.

Heart size top normal.

Vascular calcifications.
IMPRESSION: Postsurgical changes left lung appear relatively similar to the
prior exam.

Pulmonary vascular congestion most notable centrally.

## 2014-07-01 IMAGING — CR DG CHEST 2V
2 series · 2 of 2 positions shown · non-contrast
Comparison: 04/15/2012 and multiple previous

CLINICAL DATA: Cough.  Headache.  Body gait.  Hypertension and
diabetes.

CHEST - 2 VIEW

[w chest pa]
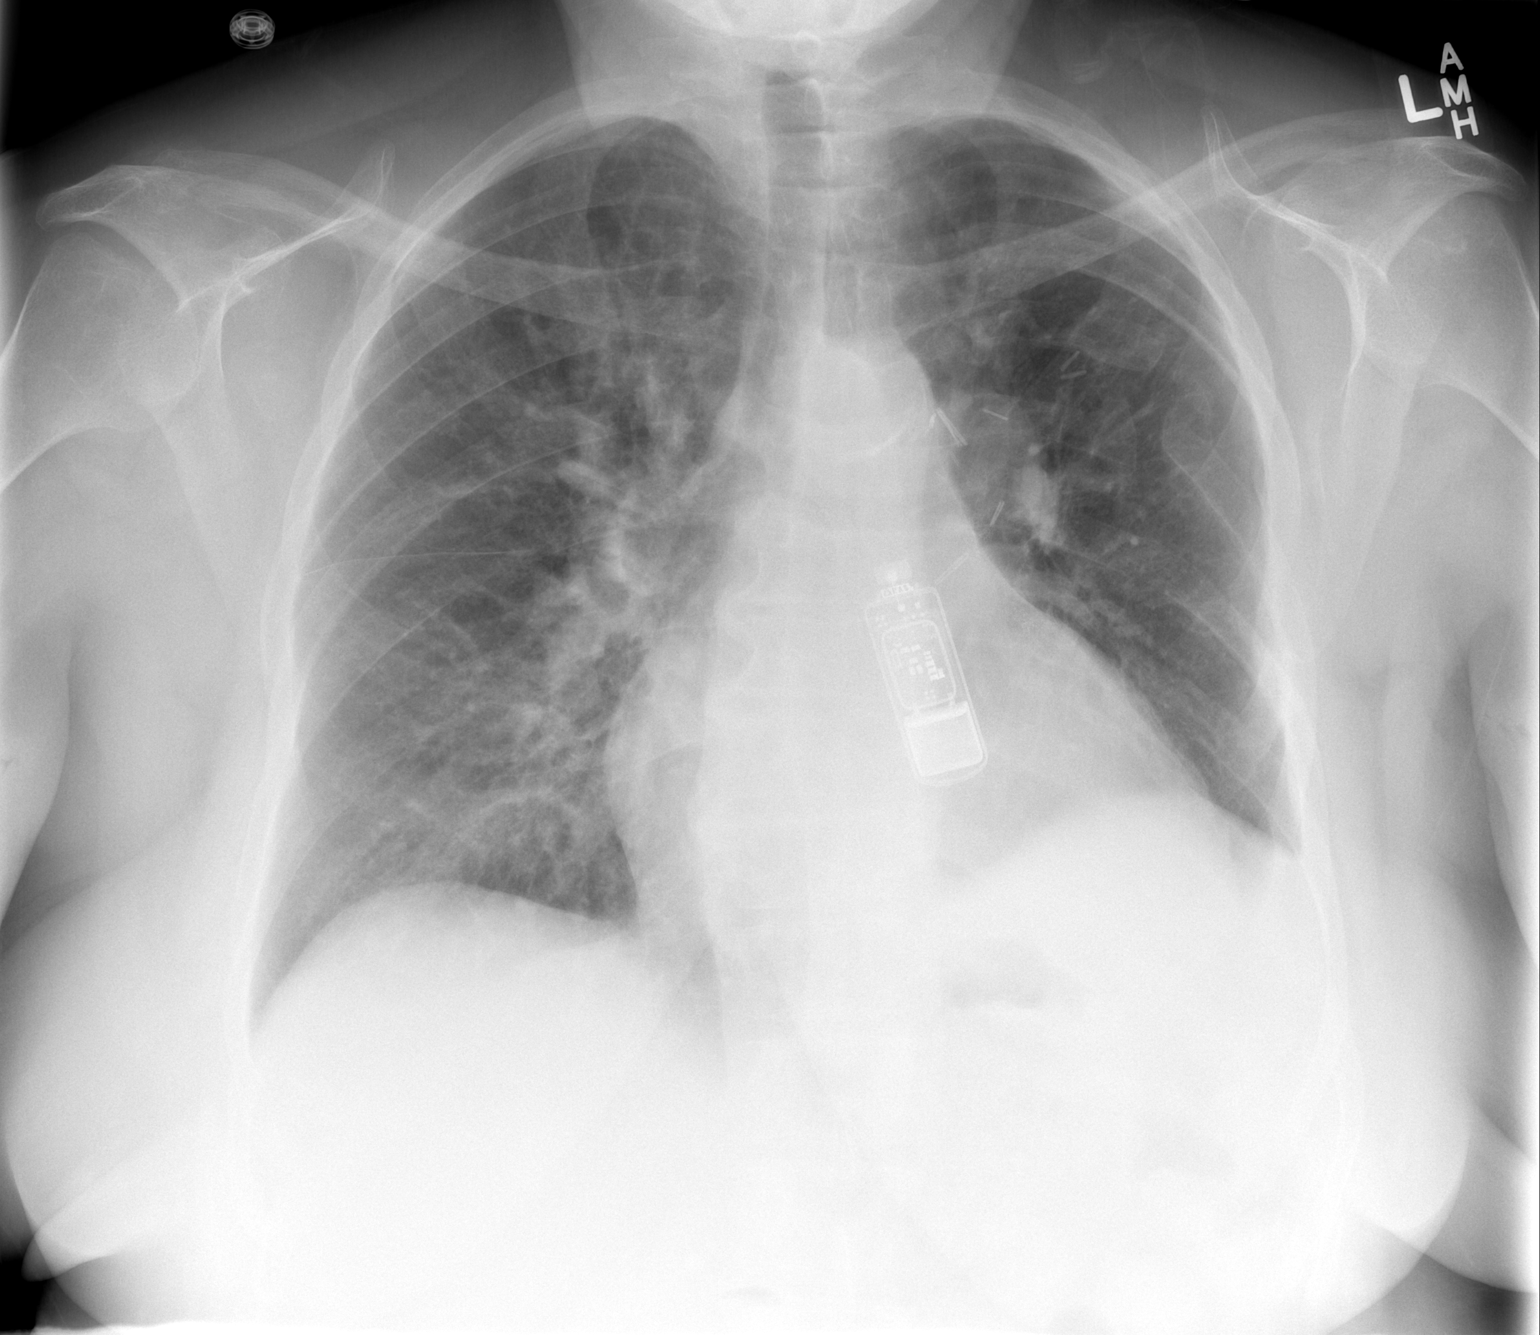

[w chest lat *]
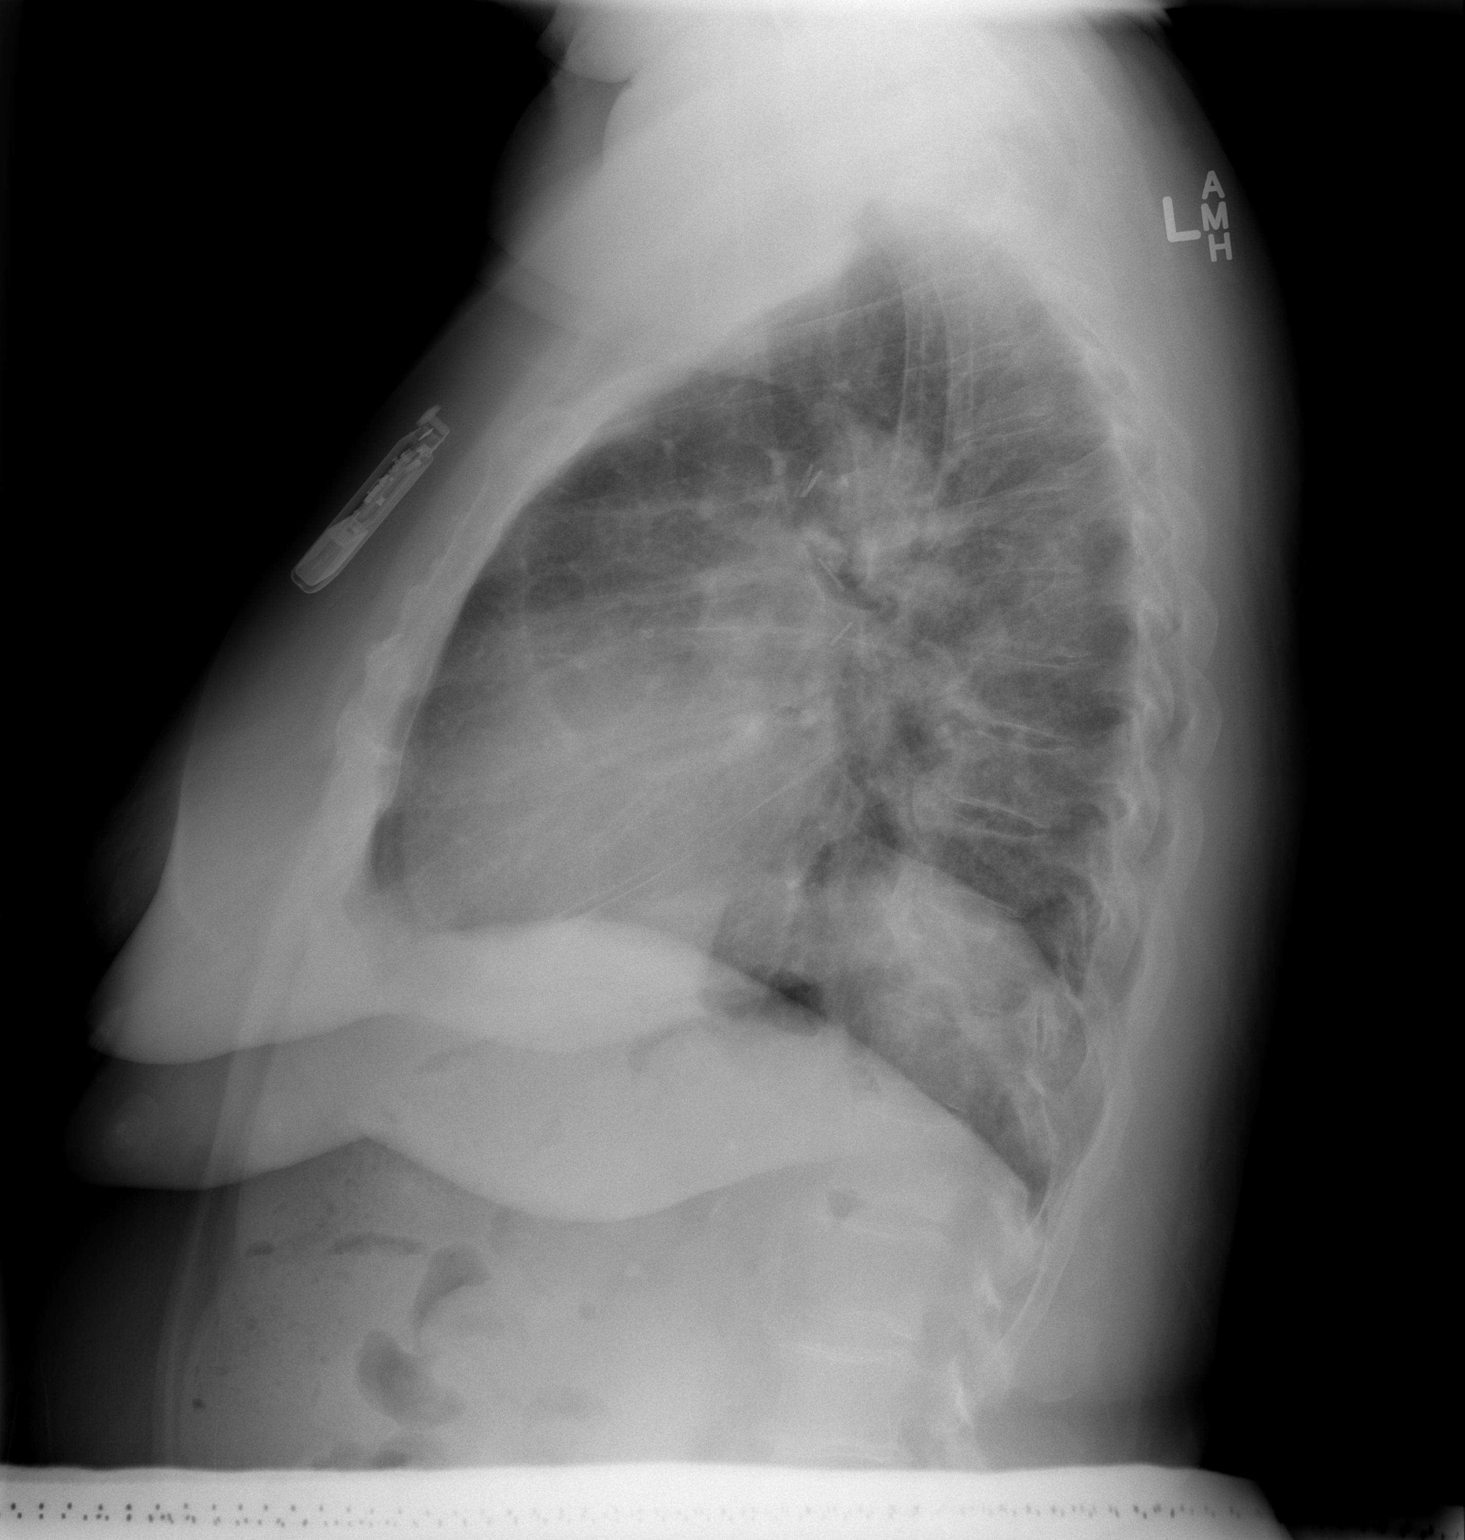

[2 of 2 positions shown; findings below may reference images not displayed]

FINDINGS: The heart is at the upper limits of normal in size.  The
aorta is unfolded.  There is been previous chest surgery on the
left.  There is chronic elevation of the left hemidiaphragm.  There
may be minimal patchy perihilar infiltrate/atelectasis.  No dense
consolidation, collapse or effusion.
IMPRESSION: I think there are patchy densities in the perihilar regions
bilaterally suggesting mild pneumonia.  No advanced disease.

## 2017-09-08 DEATH — deceased
